# Patient Record
Sex: Female | Born: 1940 | Race: White | Hispanic: No | Smoking: Never smoker
Health system: Southern US, Community
[De-identification: ages and names within clinical notes are randomized; demographics above are authoritative.]

## PROBLEM LIST (undated history)

## (undated) DIAGNOSIS — M545 Low back pain, unspecified: Secondary | ICD-10-CM

## (undated) DIAGNOSIS — G459 Transient cerebral ischemic attack, unspecified: Secondary | ICD-10-CM

## (undated) DIAGNOSIS — E032 Hypothyroidism due to medicaments and other exogenous substances: Secondary | ICD-10-CM

## (undated) DIAGNOSIS — I10 Essential (primary) hypertension: Secondary | ICD-10-CM

## (undated) DIAGNOSIS — M949 Disorder of cartilage, unspecified: Secondary | ICD-10-CM

## (undated) DIAGNOSIS — E785 Hyperlipidemia, unspecified: Secondary | ICD-10-CM

## (undated) DIAGNOSIS — M899 Disorder of bone, unspecified: Secondary | ICD-10-CM

## (undated) DIAGNOSIS — D126 Benign neoplasm of colon, unspecified: Secondary | ICD-10-CM

## (undated) DIAGNOSIS — E7800 Pure hypercholesterolemia, unspecified: Secondary | ICD-10-CM

## (undated) DIAGNOSIS — I340 Nonrheumatic mitral (valve) insufficiency: Secondary | ICD-10-CM

## (undated) DIAGNOSIS — C4490 Unspecified malignant neoplasm of skin, unspecified: Secondary | ICD-10-CM

## (undated) DIAGNOSIS — I350 Nonrheumatic aortic (valve) stenosis: Secondary | ICD-10-CM

## (undated) DIAGNOSIS — C73 Malignant neoplasm of thyroid gland: Secondary | ICD-10-CM

## (undated) DIAGNOSIS — J309 Allergic rhinitis, unspecified: Secondary | ICD-10-CM

## (undated) HISTORY — DX: Nonrheumatic aortic (valve) stenosis: I35.0

## (undated) HISTORY — DX: Hyperlipidemia, unspecified: E78.5

## (undated) HISTORY — DX: Unspecified malignant neoplasm of skin, unspecified: C44.90

## (undated) HISTORY — DX: Nonrheumatic mitral (valve) insufficiency: I34.0

## (undated) HISTORY — PX: COLONOSCOPY: SHX174

## (undated) HISTORY — DX: Pure hypercholesterolemia, unspecified: E78.00

## (undated) HISTORY — DX: Allergic rhinitis, unspecified: J30.9

## (undated) HISTORY — PX: DILATION AND CURETTAGE OF UTERUS: SHX78

## (undated) HISTORY — DX: Malignant neoplasm of thyroid gland: C73

## (undated) HISTORY — DX: Disorder of cartilage, unspecified: M94.9

## (undated) HISTORY — DX: Essential (primary) hypertension: I10

## (undated) HISTORY — DX: Transient cerebral ischemic attack, unspecified: G45.9

## (undated) HISTORY — DX: Low back pain, unspecified: M54.50

## (undated) HISTORY — PX: POLYPECTOMY: SHX149

## (undated) HISTORY — DX: Hypothyroidism due to medicaments and other exogenous substances: E03.2

## (undated) HISTORY — DX: Benign neoplasm of colon, unspecified: D12.6

## (undated) HISTORY — DX: Disorder of bone, unspecified: M89.9

## (undated) HISTORY — DX: Low back pain: M54.5

## (undated) HISTORY — PX: TOTAL THYROIDECTOMY: SHX2547

---

## 1998-06-07 ENCOUNTER — Other Ambulatory Visit: Admission: RE | Admit: 1998-06-07 | Discharge: 1998-06-07 | Payer: Self-pay | Admitting: Obstetrics & Gynecology

## 1999-06-20 ENCOUNTER — Other Ambulatory Visit: Admission: RE | Admit: 1999-06-20 | Discharge: 1999-06-20 | Payer: Self-pay | Admitting: Obstetrics and Gynecology

## 2000-07-09 ENCOUNTER — Other Ambulatory Visit: Admission: RE | Admit: 2000-07-09 | Discharge: 2000-07-09 | Payer: Self-pay | Admitting: Obstetrics and Gynecology

## 2000-10-30 ENCOUNTER — Emergency Department (HOSPITAL_COMMUNITY): Admission: EM | Admit: 2000-10-30 | Discharge: 2000-10-30 | Payer: Self-pay

## 2001-07-18 ENCOUNTER — Other Ambulatory Visit: Admission: RE | Admit: 2001-07-18 | Discharge: 2001-07-18 | Payer: Self-pay | Admitting: Obstetrics and Gynecology

## 2002-03-30 ENCOUNTER — Encounter (INDEPENDENT_AMBULATORY_CARE_PROVIDER_SITE_OTHER): Payer: Self-pay | Admitting: Gastroenterology

## 2002-07-19 ENCOUNTER — Other Ambulatory Visit: Admission: RE | Admit: 2002-07-19 | Discharge: 2002-07-19 | Payer: Self-pay | Admitting: Obstetrics and Gynecology

## 2003-07-23 ENCOUNTER — Other Ambulatory Visit: Admission: RE | Admit: 2003-07-23 | Discharge: 2003-07-23 | Payer: Self-pay | Admitting: Obstetrics and Gynecology

## 2004-09-18 ENCOUNTER — Other Ambulatory Visit: Admission: RE | Admit: 2004-09-18 | Discharge: 2004-09-18 | Payer: Self-pay | Admitting: Obstetrics and Gynecology

## 2004-10-20 ENCOUNTER — Ambulatory Visit: Payer: Self-pay | Admitting: Pulmonary Disease

## 2004-12-05 ENCOUNTER — Ambulatory Visit: Payer: Self-pay | Admitting: Pulmonary Disease

## 2004-12-25 ENCOUNTER — Ambulatory Visit: Payer: Self-pay | Admitting: Pulmonary Disease

## 2005-04-08 ENCOUNTER — Ambulatory Visit: Payer: Self-pay | Admitting: Pulmonary Disease

## 2005-07-09 ENCOUNTER — Ambulatory Visit: Payer: Self-pay | Admitting: Pulmonary Disease

## 2005-08-20 ENCOUNTER — Ambulatory Visit: Payer: Self-pay | Admitting: Pulmonary Disease

## 2005-09-29 ENCOUNTER — Other Ambulatory Visit: Admission: RE | Admit: 2005-09-29 | Discharge: 2005-09-29 | Payer: Self-pay | Admitting: Obstetrics and Gynecology

## 2006-01-06 ENCOUNTER — Ambulatory Visit: Payer: Self-pay | Admitting: Pulmonary Disease

## 2006-07-09 ENCOUNTER — Ambulatory Visit: Payer: Self-pay | Admitting: Pulmonary Disease

## 2006-08-17 ENCOUNTER — Ambulatory Visit: Payer: Self-pay | Admitting: Pulmonary Disease

## 2007-03-01 ENCOUNTER — Ambulatory Visit: Payer: Self-pay | Admitting: Pulmonary Disease

## 2007-03-01 LAB — CONVERTED CEMR LAB
AST: 22 units/L (ref 0–37)
Albumin: 4.4 g/dL (ref 3.5–5.2)
Basophils Absolute: 0 10*3/uL (ref 0.0–0.1)
Bilirubin, Direct: 0.2 mg/dL (ref 0.0–0.3)
Chloride: 105 meq/L (ref 96–112)
Cholesterol: 183 mg/dL (ref 0–200)
Eosinophils Absolute: 0.1 10*3/uL (ref 0.0–0.6)
GFR calc Af Amer: 107 mL/min
GFR calc non Af Amer: 89 mL/min
HCT: 40.4 % (ref 36.0–46.0)
Lymphocytes Relative: 24.2 % (ref 12.0–46.0)
MCHC: 34.9 g/dL (ref 30.0–36.0)
MCV: 88.8 fL (ref 78.0–100.0)
Neutro Abs: 4 10*3/uL (ref 1.4–7.7)
Neutrophils Relative %: 66.4 % (ref 43.0–77.0)
Platelets: 289 10*3/uL (ref 150–400)
Potassium: 4.1 meq/L (ref 3.5–5.1)
RBC: 4.56 M/uL (ref 3.87–5.11)
Sodium: 142 meq/L (ref 135–145)
TSH: 1.26 microintl units/mL (ref 0.35–5.50)
Total CHOL/HDL Ratio: 3.6
Triglycerides: 153 mg/dL — ABNORMAL HIGH (ref 0–149)

## 2007-08-02 ENCOUNTER — Ambulatory Visit: Payer: Self-pay | Admitting: Pulmonary Disease

## 2007-08-17 ENCOUNTER — Ambulatory Visit: Payer: Self-pay | Admitting: Pulmonary Disease

## 2007-08-31 ENCOUNTER — Ambulatory Visit: Payer: Self-pay | Admitting: Pulmonary Disease

## 2007-08-31 LAB — CONVERTED CEMR LAB
Bilirubin, Direct: 0.1 mg/dL (ref 0.0–0.3)
CO2: 30 meq/L (ref 19–32)
Cholesterol: 174 mg/dL (ref 0–200)
Creatinine, Ser: 0.7 mg/dL (ref 0.4–1.2)
GFR calc Af Amer: 107 mL/min
Glucose, Bld: 106 mg/dL — ABNORMAL HIGH (ref 70–99)
HDL: 47.4 mg/dL (ref >39.0)
Potassium: 3.9 meq/L (ref 3.5–5.1)
Sodium: 139 meq/L (ref 135–145)
Total Bilirubin: 0.8 mg/dL (ref 0.3–1.2)
Total CHOL/HDL Ratio: 3.7
Total Protein: 7.7 g/dL (ref 6.0–8.3)
Triglycerides: 136 mg/dL (ref 0–149)

## 2007-12-09 ENCOUNTER — Telehealth (INDEPENDENT_AMBULATORY_CARE_PROVIDER_SITE_OTHER): Payer: Self-pay | Admitting: *Deleted

## 2008-01-24 ENCOUNTER — Encounter: Payer: Self-pay | Admitting: Pulmonary Disease

## 2008-01-24 ENCOUNTER — Telehealth (INDEPENDENT_AMBULATORY_CARE_PROVIDER_SITE_OTHER): Payer: Self-pay | Admitting: *Deleted

## 2008-01-25 ENCOUNTER — Telehealth: Payer: Self-pay | Admitting: Pulmonary Disease

## 2008-01-27 ENCOUNTER — Ambulatory Visit: Payer: Self-pay | Admitting: Pulmonary Disease

## 2008-01-27 ENCOUNTER — Telehealth (INDEPENDENT_AMBULATORY_CARE_PROVIDER_SITE_OTHER): Payer: Self-pay | Admitting: *Deleted

## 2008-01-27 DIAGNOSIS — G459 Transient cerebral ischemic attack, unspecified: Secondary | ICD-10-CM

## 2008-01-27 DIAGNOSIS — M949 Disorder of cartilage, unspecified: Secondary | ICD-10-CM

## 2008-01-27 DIAGNOSIS — I10 Essential (primary) hypertension: Secondary | ICD-10-CM

## 2008-01-27 DIAGNOSIS — E785 Hyperlipidemia, unspecified: Secondary | ICD-10-CM

## 2008-01-27 DIAGNOSIS — M899 Disorder of bone, unspecified: Secondary | ICD-10-CM | POA: Insufficient documentation

## 2008-01-27 LAB — CONVERTED CEMR LAB
AST: 21 units/L (ref 0–37)
Albumin: 4.3 g/dL (ref 3.5–5.2)
Alkaline Phosphatase: 55 units/L (ref 39–117)
BUN: 17 mg/dL (ref 6–23)
Chloride: 102 meq/L (ref 96–112)
GFR calc Af Amer: 128 mL/min
GFR calc non Af Amer: 106 mL/min
Potassium: 4.3 meq/L (ref 3.5–5.1)
Total Bilirubin: 0.6 mg/dL (ref 0.3–1.2)
Total CK: 104 units/L (ref 7–177)

## 2008-01-30 ENCOUNTER — Telehealth (INDEPENDENT_AMBULATORY_CARE_PROVIDER_SITE_OTHER): Payer: Self-pay | Admitting: *Deleted

## 2008-02-09 ENCOUNTER — Ambulatory Visit: Payer: Self-pay

## 2008-02-09 ENCOUNTER — Ambulatory Visit: Payer: Self-pay | Admitting: Pulmonary Disease

## 2008-02-13 ENCOUNTER — Encounter: Payer: Self-pay | Admitting: Pulmonary Disease

## 2008-02-14 ENCOUNTER — Ambulatory Visit (HOSPITAL_COMMUNITY): Admission: RE | Admit: 2008-02-14 | Discharge: 2008-02-14 | Payer: Self-pay | Admitting: Orthopedic Surgery

## 2008-02-16 ENCOUNTER — Telehealth: Payer: Self-pay | Admitting: Pulmonary Disease

## 2008-02-29 ENCOUNTER — Ambulatory Visit: Payer: Self-pay | Admitting: Pulmonary Disease

## 2008-02-29 DIAGNOSIS — M545 Low back pain, unspecified: Secondary | ICD-10-CM | POA: Insufficient documentation

## 2008-02-29 DIAGNOSIS — I679 Cerebrovascular disease, unspecified: Secondary | ICD-10-CM | POA: Insufficient documentation

## 2008-02-29 LAB — CONVERTED CEMR LAB
ALT: 21 units/L (ref 0–35)
AST: 20 units/L (ref 0–37)
Alkaline Phosphatase: 57 units/L (ref 39–117)
Basophils Absolute: 0.1 10*3/uL (ref 0.0–0.1)
Basophils Relative: 0.6 % (ref 0.0–1.0)
Bilirubin, Direct: 0.1 mg/dL (ref 0.0–0.3)
CO2: 28 meq/L (ref 19–32)
Chloride: 102 meq/L (ref 96–112)
Direct LDL: 200.1 mg/dL
Eosinophils Absolute: 0 10*3/uL (ref 0.0–0.7)
GFR calc non Af Amer: 106 mL/min
HDL: 56 mg/dL (ref >39.0)
Lymphocytes Relative: 18 % (ref 12.0–46.0)
MCHC: 33.9 g/dL (ref 30.0–36.0)
MCV: 89.6 fL (ref 78.0–100.0)
Neutrophils Relative %: 73.8 % (ref 43.0–77.0)
Platelets: 344 10*3/uL (ref 150–400)
Potassium: 4.3 meq/L (ref 3.5–5.1)
RBC: 4.86 M/uL (ref 3.87–5.11)
Total Bilirubin: 0.8 mg/dL (ref 0.3–1.2)
Triglycerides: 192 mg/dL — ABNORMAL HIGH (ref 0–149)
VLDL: 38 mg/dL (ref 0–40)
WBC: 9.9 10*3/uL (ref 4.5–10.5)

## 2008-05-09 ENCOUNTER — Telehealth (INDEPENDENT_AMBULATORY_CARE_PROVIDER_SITE_OTHER): Payer: Self-pay | Admitting: *Deleted

## 2008-05-14 ENCOUNTER — Ambulatory Visit: Payer: Self-pay | Admitting: Pulmonary Disease

## 2008-05-21 ENCOUNTER — Telehealth: Payer: Self-pay | Admitting: Adult Health

## 2008-05-21 LAB — CONVERTED CEMR LAB
Cholesterol: 156 mg/dL (ref 0–200)
HDL: 49.5 mg/dL (ref >39.0)
LDL Cholesterol: 86 mg/dL (ref 0–99)
Total CHOL/HDL Ratio: 3.2
Triglycerides: 103 mg/dL (ref 0–149)
VLDL: 21 mg/dL (ref 0–40)

## 2008-05-24 ENCOUNTER — Ambulatory Visit: Payer: Self-pay | Admitting: Internal Medicine

## 2008-05-24 ENCOUNTER — Telehealth: Payer: Self-pay | Admitting: Internal Medicine

## 2008-05-29 LAB — CONVERTED CEMR LAB
Basophils Absolute: 0 10*3/uL (ref 0.0–0.1)
Hemoglobin: 13.8 g/dL (ref 12.0–15.0)
Iron: 56 ug/dL (ref 42–145)
Lymphocytes Relative: 24.7 % (ref 12.0–46.0)
MCHC: 34 g/dL (ref 30.0–36.0)
Neutro Abs: 4.3 10*3/uL (ref 1.4–7.7)
Platelets: 314 10*3/uL (ref 150–400)
RDW: 12.5 % (ref 11.5–14.6)
Transferrin: 287.8 mg/dL (ref >=212.0)

## 2008-05-30 ENCOUNTER — Ambulatory Visit: Payer: Self-pay | Admitting: Gastroenterology

## 2008-06-11 ENCOUNTER — Telehealth: Payer: Self-pay | Admitting: Internal Medicine

## 2008-06-18 ENCOUNTER — Ambulatory Visit: Payer: Self-pay | Admitting: Internal Medicine

## 2008-06-18 ENCOUNTER — Encounter: Payer: Self-pay | Admitting: Internal Medicine

## 2008-06-20 ENCOUNTER — Encounter: Payer: Self-pay | Admitting: Internal Medicine

## 2008-07-25 ENCOUNTER — Ambulatory Visit: Payer: Self-pay | Admitting: Pulmonary Disease

## 2008-08-28 ENCOUNTER — Ambulatory Visit: Payer: Self-pay | Admitting: Pulmonary Disease

## 2008-09-11 ENCOUNTER — Telehealth (INDEPENDENT_AMBULATORY_CARE_PROVIDER_SITE_OTHER): Payer: Self-pay | Admitting: *Deleted

## 2008-09-11 LAB — CONVERTED CEMR LAB
Basophils Absolute: 0 10*3/uL (ref 0.0–0.1)
Chloride: 104 meq/L (ref 96–112)
Eosinophils Absolute: 0.1 10*3/uL (ref 0.0–0.7)
Eosinophils Relative: 2 % (ref 0.0–5.0)
GFR calc non Af Amer: 106 mL/min
Iron: 91 ug/dL (ref 42–145)
Lymphocytes Relative: 27 % (ref 12.0–46.0)
MCHC: 34.7 g/dL (ref 30.0–36.0)
MCV: 89.7 fL (ref 78.0–100.0)
Neutrophils Relative %: 62.4 % (ref 43.0–77.0)
Platelets: 304 10*3/uL (ref 150–400)
Potassium: 4.2 meq/L (ref 3.5–5.1)
Saturation Ratios: 21.5 % (ref 20.0–50.0)
WBC: 6.5 10*3/uL (ref 4.5–10.5)

## 2008-09-12 ENCOUNTER — Ambulatory Visit: Payer: Self-pay | Admitting: Pulmonary Disease

## 2008-09-16 DIAGNOSIS — D126 Benign neoplasm of colon, unspecified: Secondary | ICD-10-CM

## 2008-09-16 LAB — CONVERTED CEMR LAB: Cholesterol: 189 mg/dL (ref 0–200)

## 2008-10-08 ENCOUNTER — Ambulatory Visit: Payer: Self-pay | Admitting: Pulmonary Disease

## 2009-01-18 ENCOUNTER — Telehealth: Payer: Self-pay | Admitting: Pulmonary Disease

## 2009-02-27 ENCOUNTER — Ambulatory Visit: Payer: Self-pay | Admitting: Pulmonary Disease

## 2009-02-27 DIAGNOSIS — J309 Allergic rhinitis, unspecified: Secondary | ICD-10-CM | POA: Insufficient documentation

## 2009-03-03 LAB — CONVERTED CEMR LAB
Alkaline Phosphatase: 69 units/L (ref 39–117)
Basophils Relative: 0.2 % (ref 0.0–3.0)
Bilirubin, Direct: 0.1 mg/dL (ref 0.0–0.3)
Calcium: 9.9 mg/dL (ref 8.4–10.5)
Creatinine, Ser: 0.6 mg/dL (ref 0.4–1.2)
Eosinophils Absolute: 0.1 10*3/uL (ref 0.0–0.7)
HDL: 54.7 mg/dL (ref >39.00)
LDL Cholesterol: 116 mg/dL — ABNORMAL HIGH (ref 0–99)
Lymphocytes Relative: 25.1 % (ref 12.0–46.0)
MCHC: 33.5 g/dL (ref 30.0–36.0)
Neutrophils Relative %: 65.8 % (ref 43.0–77.0)
RBC: 4.64 M/uL (ref 3.87–5.11)
Total CHOL/HDL Ratio: 4
Total Protein: 8 g/dL (ref 6.0–8.3)
Triglycerides: 114 mg/dL (ref 0.0–149.0)
VLDL: 22.8 mg/dL (ref 0.0–40.0)
WBC: 5.5 10*3/uL (ref 4.5–10.5)

## 2009-03-04 ENCOUNTER — Ambulatory Visit (HOSPITAL_COMMUNITY): Admission: RE | Admit: 2009-03-04 | Discharge: 2009-03-04 | Payer: Self-pay | Admitting: Pulmonary Disease

## 2009-03-06 ENCOUNTER — Ambulatory Visit: Payer: Self-pay

## 2009-03-07 ENCOUNTER — Telehealth: Payer: Self-pay | Admitting: Pulmonary Disease

## 2009-03-07 ENCOUNTER — Encounter: Payer: Self-pay | Admitting: Pulmonary Disease

## 2009-03-13 ENCOUNTER — Encounter: Payer: Self-pay | Admitting: Pulmonary Disease

## 2009-03-13 ENCOUNTER — Encounter: Admission: RE | Admit: 2009-03-13 | Discharge: 2009-03-13 | Payer: Self-pay | Admitting: Pulmonary Disease

## 2009-03-13 ENCOUNTER — Other Ambulatory Visit: Admission: RE | Admit: 2009-03-13 | Discharge: 2009-03-13 | Payer: Self-pay | Admitting: Interventional Radiology

## 2009-03-13 ENCOUNTER — Encounter (INDEPENDENT_AMBULATORY_CARE_PROVIDER_SITE_OTHER): Payer: Self-pay | Admitting: Interventional Radiology

## 2009-03-21 ENCOUNTER — Telehealth (INDEPENDENT_AMBULATORY_CARE_PROVIDER_SITE_OTHER): Payer: Self-pay | Admitting: *Deleted

## 2009-04-04 ENCOUNTER — Encounter: Payer: Self-pay | Admitting: Pulmonary Disease

## 2009-05-27 ENCOUNTER — Encounter (INDEPENDENT_AMBULATORY_CARE_PROVIDER_SITE_OTHER): Payer: Self-pay | Admitting: Surgery

## 2009-05-27 ENCOUNTER — Ambulatory Visit (HOSPITAL_COMMUNITY): Admission: RE | Admit: 2009-05-27 | Discharge: 2009-05-28 | Payer: Self-pay | Admitting: Surgery

## 2009-05-27 ENCOUNTER — Encounter: Payer: Self-pay | Admitting: Pulmonary Disease

## 2009-07-03 ENCOUNTER — Encounter: Payer: Self-pay | Admitting: Pulmonary Disease

## 2009-07-14 ENCOUNTER — Emergency Department (HOSPITAL_BASED_OUTPATIENT_CLINIC_OR_DEPARTMENT_OTHER): Admission: EM | Admit: 2009-07-14 | Discharge: 2009-07-14 | Payer: Self-pay | Admitting: Emergency Medicine

## 2009-07-15 ENCOUNTER — Encounter: Payer: Self-pay | Admitting: Pulmonary Disease

## 2009-07-22 ENCOUNTER — Encounter (HOSPITAL_COMMUNITY): Admission: RE | Admit: 2009-07-22 | Discharge: 2009-10-03 | Payer: Self-pay | Admitting: Internal Medicine

## 2009-08-09 ENCOUNTER — Ambulatory Visit: Payer: Self-pay | Admitting: Pulmonary Disease

## 2009-08-14 ENCOUNTER — Encounter: Payer: Self-pay | Admitting: Pulmonary Disease

## 2009-08-29 ENCOUNTER — Ambulatory Visit: Payer: Self-pay | Admitting: Pulmonary Disease

## 2009-08-29 DIAGNOSIS — C73 Malignant neoplasm of thyroid gland: Secondary | ICD-10-CM

## 2009-08-29 DIAGNOSIS — E032 Hypothyroidism due to medicaments and other exogenous substances: Secondary | ICD-10-CM

## 2009-09-16 ENCOUNTER — Encounter: Payer: Self-pay | Admitting: Pulmonary Disease

## 2009-09-20 ENCOUNTER — Telehealth (INDEPENDENT_AMBULATORY_CARE_PROVIDER_SITE_OTHER): Payer: Self-pay | Admitting: *Deleted

## 2009-10-13 ENCOUNTER — Ambulatory Visit: Payer: Self-pay | Admitting: Diagnostic Radiology

## 2009-10-13 ENCOUNTER — Encounter: Payer: Self-pay | Admitting: Adult Health

## 2009-10-13 ENCOUNTER — Emergency Department (HOSPITAL_BASED_OUTPATIENT_CLINIC_OR_DEPARTMENT_OTHER): Admission: EM | Admit: 2009-10-13 | Discharge: 2009-10-13 | Payer: Self-pay | Admitting: Emergency Medicine

## 2009-10-14 ENCOUNTER — Ambulatory Visit: Payer: Self-pay | Admitting: Internal Medicine

## 2009-10-22 ENCOUNTER — Telehealth: Payer: Self-pay | Admitting: Adult Health

## 2009-11-11 ENCOUNTER — Ambulatory Visit: Payer: Self-pay | Admitting: Pulmonary Disease

## 2009-12-11 ENCOUNTER — Ambulatory Visit: Payer: Self-pay | Admitting: Pulmonary Disease

## 2010-01-01 ENCOUNTER — Encounter: Admission: RE | Admit: 2010-01-01 | Discharge: 2010-01-01 | Payer: Self-pay | Admitting: Internal Medicine

## 2010-02-27 ENCOUNTER — Ambulatory Visit: Payer: Self-pay | Admitting: Pulmonary Disease

## 2010-03-01 LAB — CONVERTED CEMR LAB
ALT: 21 units/L (ref 0–35)
AST: 24 units/L (ref 0–37)
Albumin: 4.5 g/dL (ref 3.5–5.2)
Alkaline Phosphatase: 58 units/L (ref 39–117)
Basophils Absolute: 0 10*3/uL (ref 0.0–0.1)
CO2: 30 meq/L (ref 19–32)
Calcium: 9.5 mg/dL (ref 8.4–10.5)
Chloride: 104 meq/L (ref 96–112)
Cholesterol: 173 mg/dL (ref 0–200)
Eosinophils Absolute: 0.1 10*3/uL (ref 0.0–0.7)
Glucose, Bld: 93 mg/dL (ref 70–99)
HDL: 51.8 mg/dL (ref >39.00)
Lymphocytes Relative: 21.8 % (ref 12.0–46.0)
Monocytes Relative: 8.9 % (ref 3.0–12.0)
Neutrophils Relative %: 65.9 % (ref 43.0–77.0)
Platelets: 253 10*3/uL (ref 150.0–400.0)
Potassium: 4.2 meq/L (ref 3.5–5.1)
RDW: 13.5 % (ref 11.5–14.6)
Sodium: 142 meq/L (ref 135–145)
Total Protein: 8.1 g/dL (ref 6.0–8.3)
VLDL: 23.4 mg/dL (ref 0.0–40.0)

## 2010-03-13 ENCOUNTER — Encounter (INDEPENDENT_AMBULATORY_CARE_PROVIDER_SITE_OTHER): Payer: Self-pay | Admitting: *Deleted

## 2010-03-14 ENCOUNTER — Ambulatory Visit: Payer: Self-pay

## 2010-03-14 ENCOUNTER — Encounter: Payer: Self-pay | Admitting: Pulmonary Disease

## 2010-04-03 IMAGING — CR DG CHEST 2V
2 series · 2 of 2 positions shown · non-contrast
Comparison: 05/23/2009

CLINICAL DATA: Hypertension.

CHEST - 2 VIEW

[w chest pa]
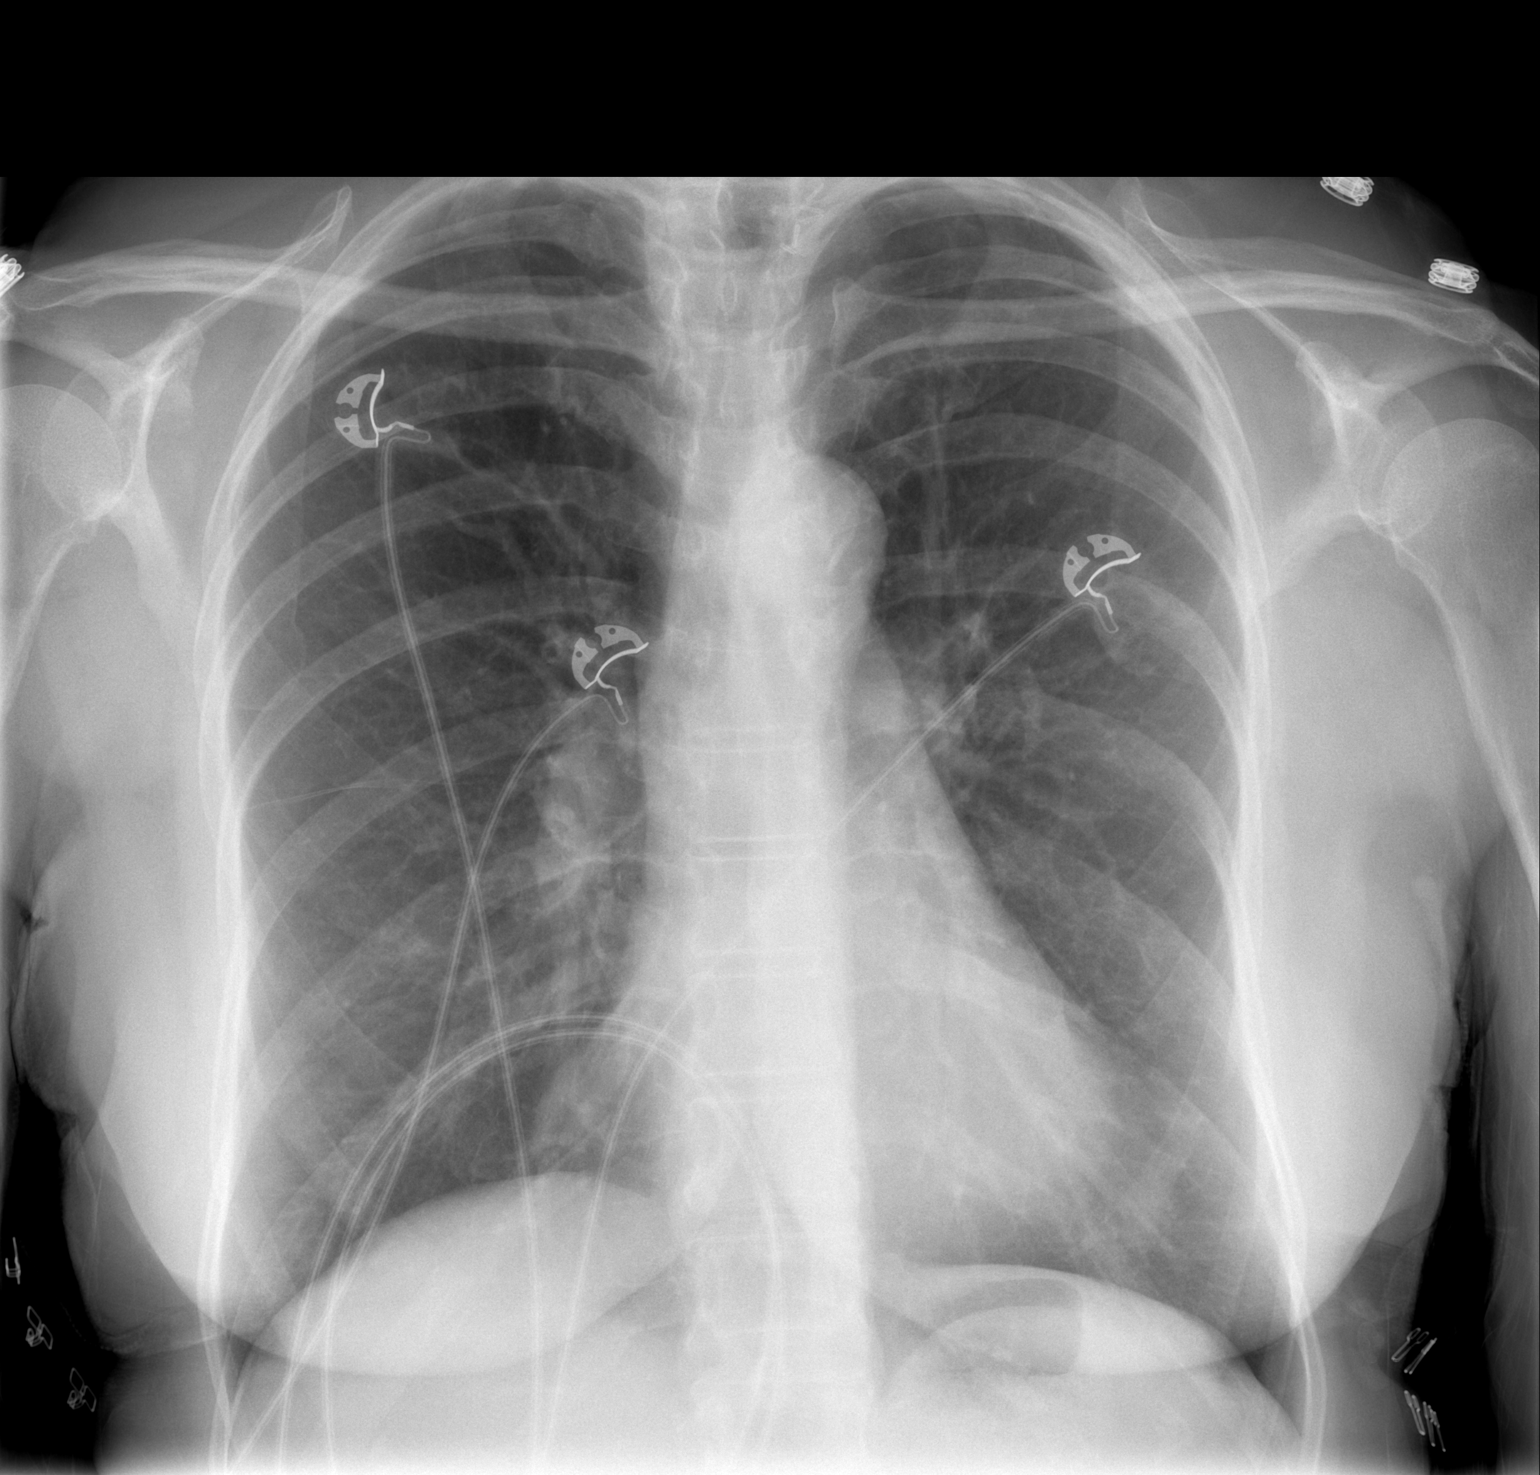

[w chest lat]
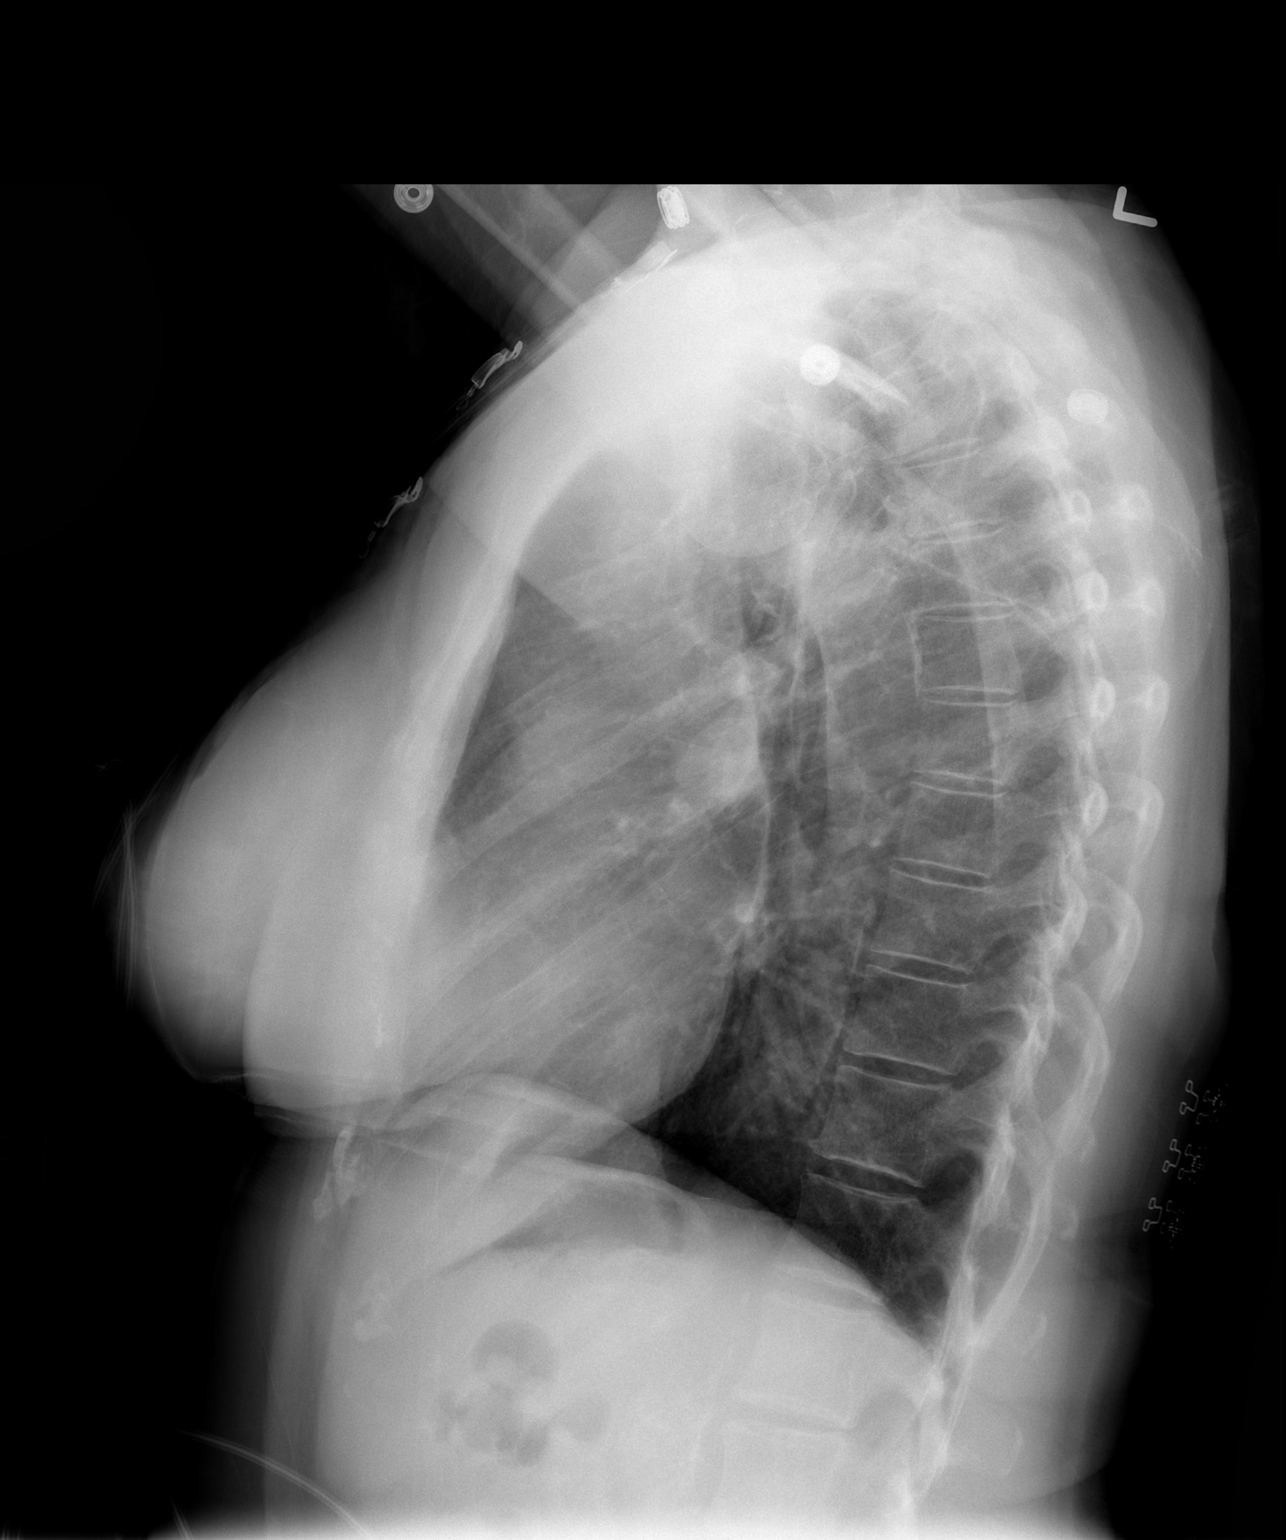

[2 of 2 positions shown; findings below may reference images not displayed]

FINDINGS: Cardiomegaly is identified.
The lungs are clear.
There is no evidence of focal airspace disease, pulmonary edema,
pleural effusion, or pneumothorax.
No acute bony abnormalities are identified.
IMPRESSION: Cardiomegaly without evidence of acute cardiopulmonary disease.

## 2010-04-03 IMAGING — CT CT HEAD W/O CM
1 series · 16 of 30 positions shown, 20 images · non-contrast
Comparison: None

CLINICAL DATA: Hypertension and headache

CT HEAD WITHOUT CONTRAST
TECHNIQUE: Contiguous axial images were obtained from the base of
the skull through the vertex without contrast.

[Series 2: head 4.8 h37s · axial · 0.44mm/px · z∈[-137,-4]mm · 16 of 32 slices shown, 20 images]
[im 2/32  brain]
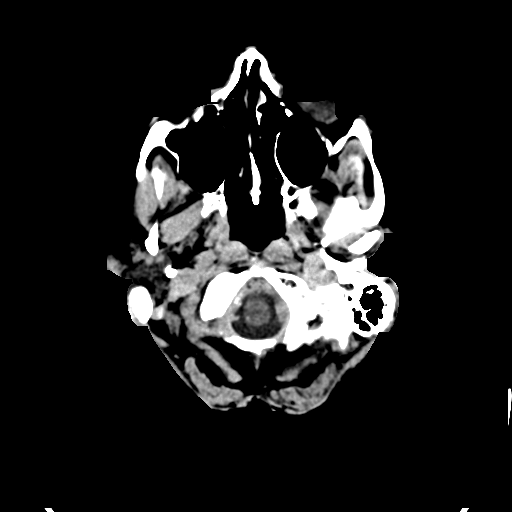
[im 2/32  bone]
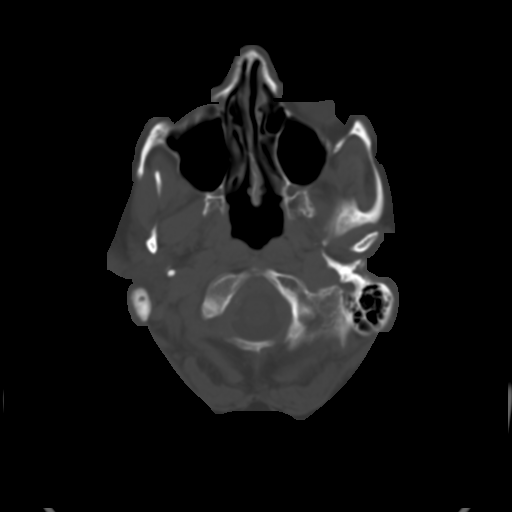
[im 4/32  brain]
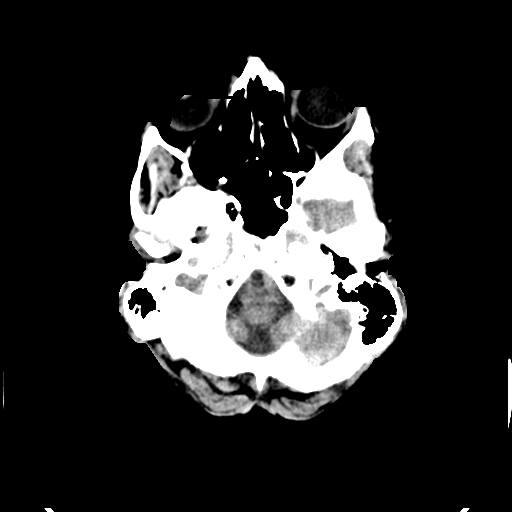
[im 6/32  brain]
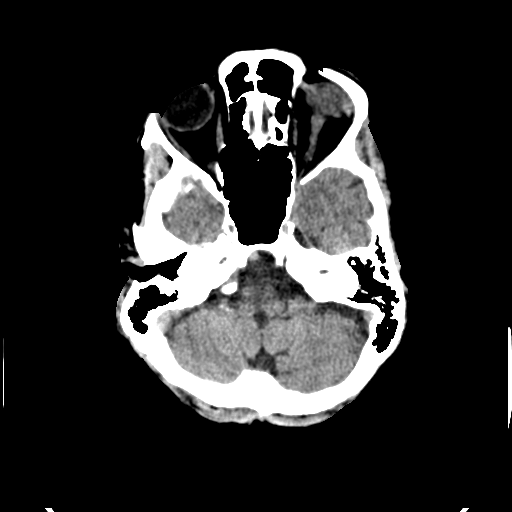
[im 8/32  brain]
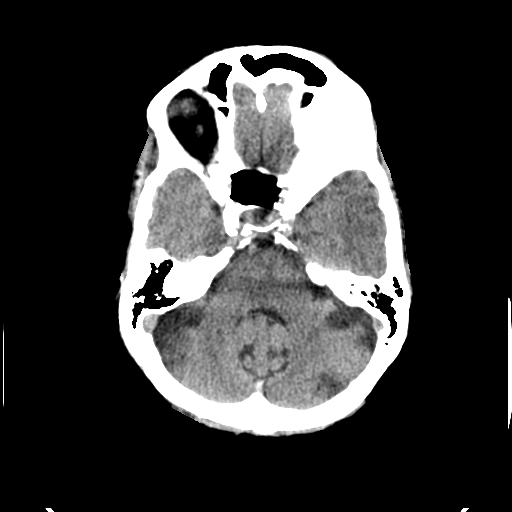
[im 9/32  brain]
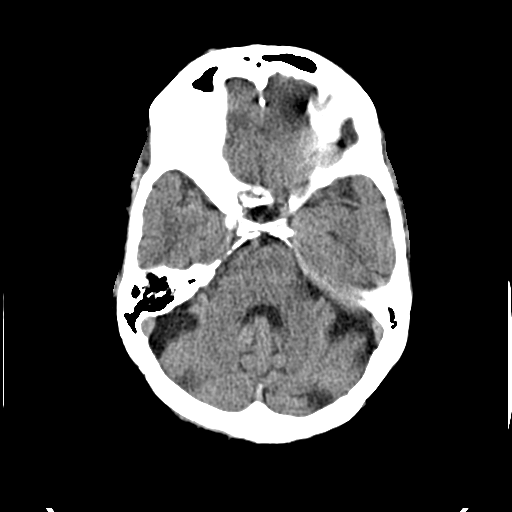
[im 9/32  bone]
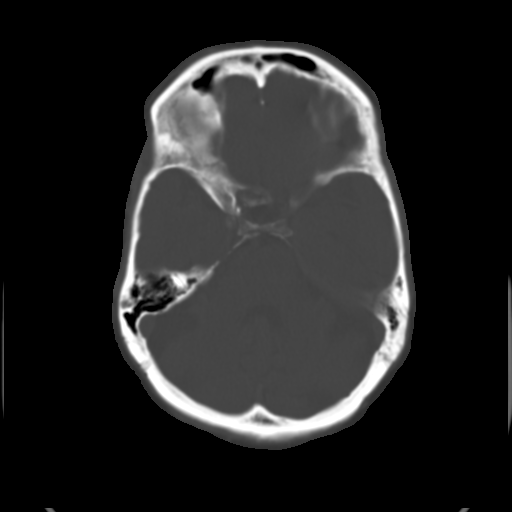
[im 11/32  brain]
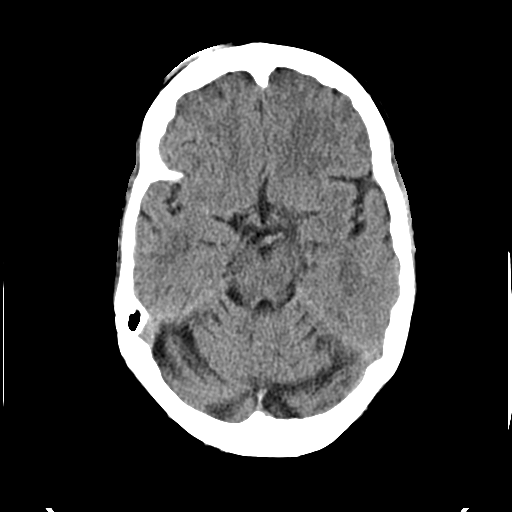
[im 13/32  brain]
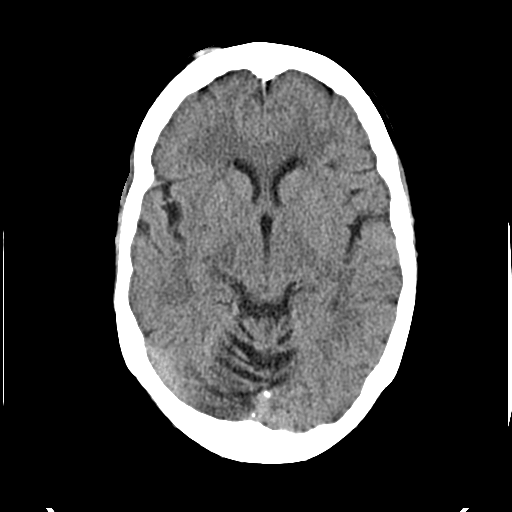
[im 15/32  brain]
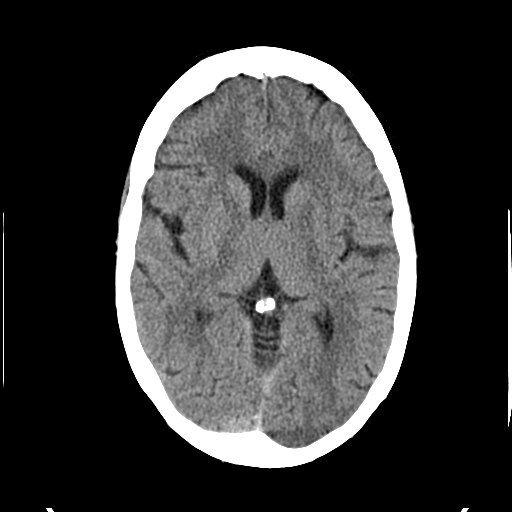
[im 17/32  brain]
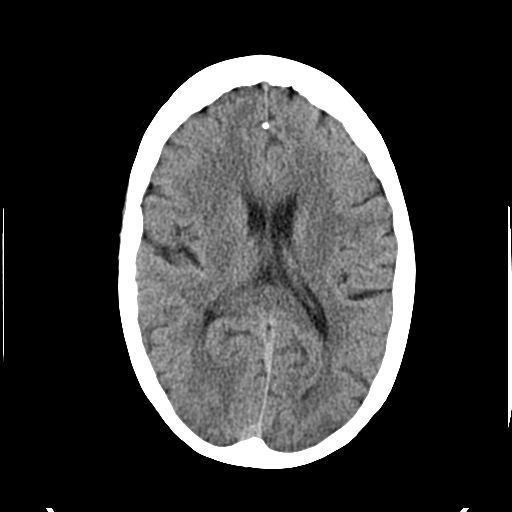
[im 17/32  bone]
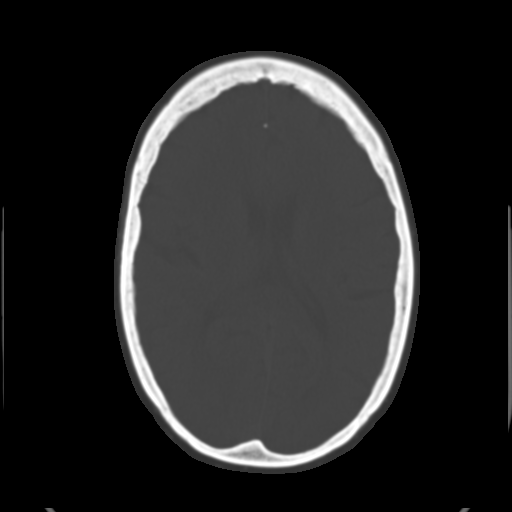
[im 19/32  brain]
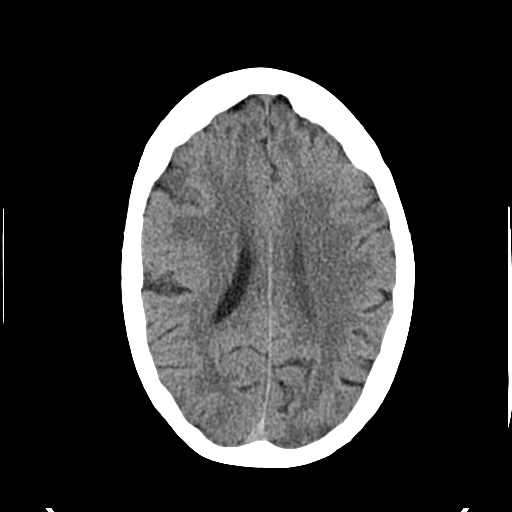
[im 21/32  brain]
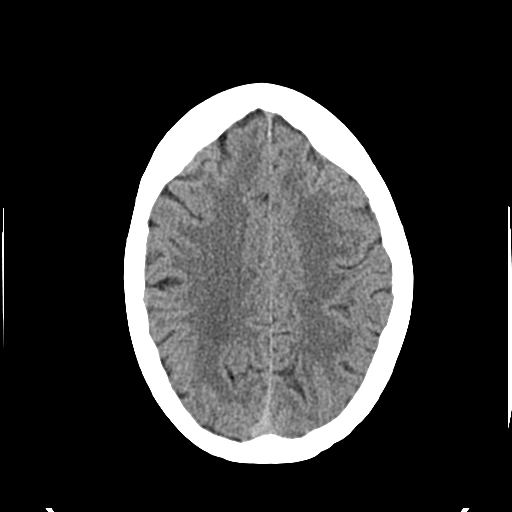
[im 23/32  brain]
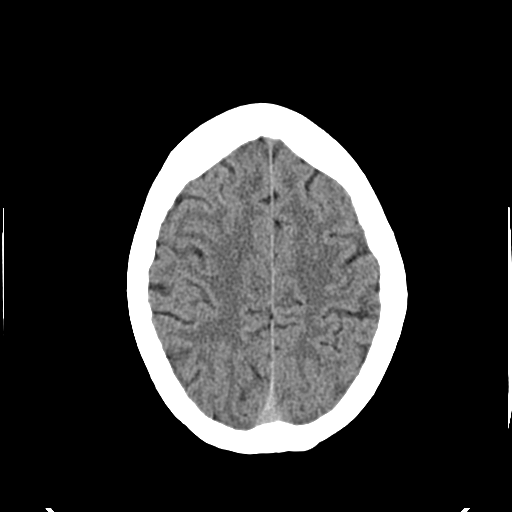
[im 24/32  brain]
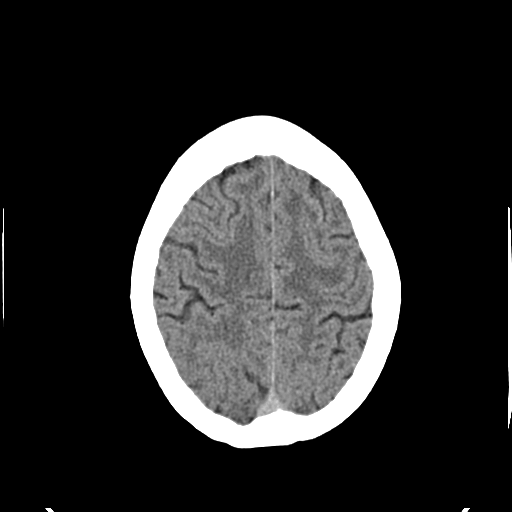
[im 24/32  bone]
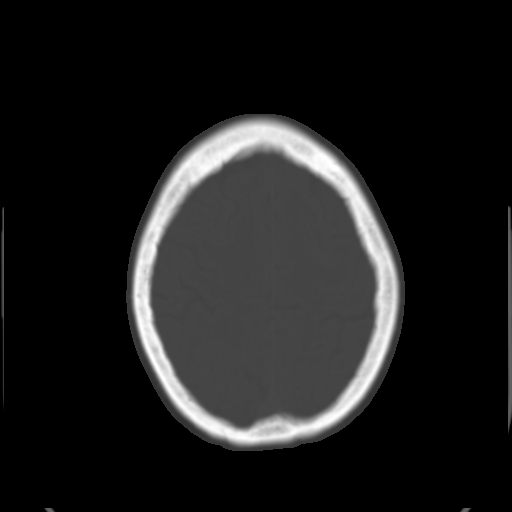
[im 26/32  brain]
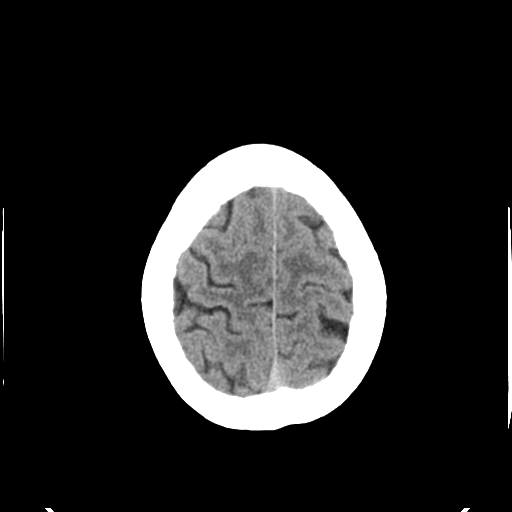
[im 28/32  brain]
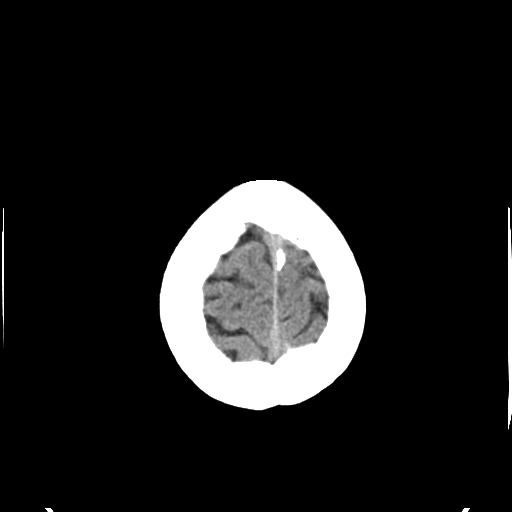
[im 30/32  brain]
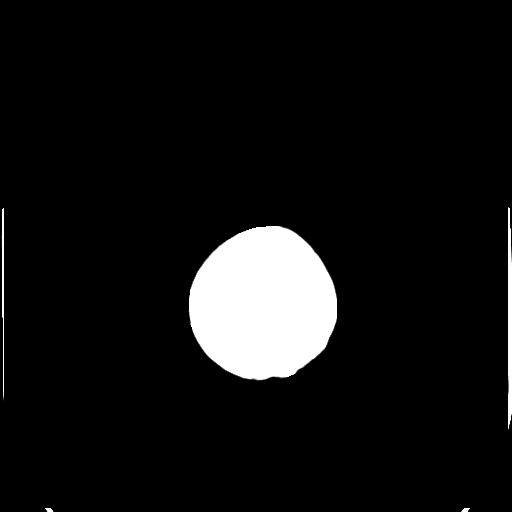

[16 of 30 positions shown; findings below may reference images not displayed]

FINDINGS: Cerebellar volume loss is identified.

No acute intracranial abnormalities are identified, including mass
lesion or mass effect, hydrocephalus, extra-axial fluid collection,
midline shift, hemorrhage, or acute infarction.  Please note that
acute infarction may be occult on CT for 24-48 hours.

The visualized bony calvarium is unremarkable.
IMPRESSION: No evidence of acute intracranial abnormality.

Cerebellar atrophy.

## 2010-07-23 ENCOUNTER — Encounter: Payer: Self-pay | Admitting: Pulmonary Disease

## 2010-07-25 ENCOUNTER — Ambulatory Visit: Payer: Self-pay | Admitting: Pulmonary Disease

## 2010-08-19 ENCOUNTER — Telehealth (INDEPENDENT_AMBULATORY_CARE_PROVIDER_SITE_OTHER): Payer: Self-pay | Admitting: *Deleted

## 2010-08-25 ENCOUNTER — Encounter (HOSPITAL_COMMUNITY)
Admission: RE | Admit: 2010-08-25 | Discharge: 2010-11-01 | Payer: Self-pay | Source: Home / Self Care | Attending: Internal Medicine | Admitting: Internal Medicine

## 2010-08-28 ENCOUNTER — Ambulatory Visit: Payer: Self-pay | Admitting: Pulmonary Disease

## 2010-08-30 LAB — CONVERTED CEMR LAB
CO2: 30 meq/L (ref 19–32)
Calcium: 9.1 mg/dL (ref 8.4–10.5)
Chloride: 97 meq/L (ref 96–112)
Creatinine, Ser: 0.6 mg/dL (ref 0.4–1.2)
Glucose, Bld: 94 mg/dL (ref 70–99)
HDL: 45.4 mg/dL (ref >39.00)
LDL Cholesterol: 89 mg/dL (ref 0–99)
Total CHOL/HDL Ratio: 3

## 2010-10-14 ENCOUNTER — Encounter: Payer: Self-pay | Admitting: Pulmonary Disease

## 2010-11-17 ENCOUNTER — Encounter: Payer: Self-pay | Admitting: Pulmonary Disease

## 2010-12-02 NOTE — Letter (Signed)
Summary: [facility] @ [place]   Imported ByLennie Odor 07/30/2010 14:31:33  _____________________________________________________________________  External Attachment:    Type:   Image     Comment:   External Document

## 2010-12-02 NOTE — Assessment & Plan Note (Signed)
Summary: 6 months/apc   Primary Care Provider:  Lorin Picket Kadon Andrus,MD  CC:  3 month ROV & review of medical issues....  History of Present Illness: 70 y/o WF here for a follow up visit... she has mult med problems as listed...    ~  February 27, 2009:  she notes the spring pollen has been rough but notes that a ZPak helped... she has been doing well on her exercise program- 30 min daily on the treadmill...      ** NOTE: exam today shows a right lower pole thyroid nodule- for eval---   ~  August 29, 2009:  when last seen right lower pole thyrroid nodule was being evaluated>>> bx showed a papillary thyroid carcinoma & subseq surg by DrCornett, CCS w/ total thyroidectomy... he sent pt to Endocrine, DrKerr, post op w/ RAI given 9/10 (106 mCi I- 131) and I- 131 whole body scan only showed uptake in neck... now on replacement SYNTHROID 112 micrograms/d & she continues f/u w/ DrKerr.   ~  December 11, 2009:  she notes that incr dose of Synthroid to 112 by DrKerr caused her BP to rise >140 sys (they decr back to 100)... TP increased Norvasc to 10mg /d and added Metoprolol- titrated up to 50mg /d... BP now much improved w/ most readings  ~130's sys... feeling better as well.   ~  February 27, 2010:  BP remains well controlled on Metoprolol, Norvasc, Lasix... no cerebrovasc symptoms on Aggrenox & due for f/u CDopplers in /May... Chol also improved w/ her Simva20... she continues f/u w/ DrKerr for her Thyroid Ca & she is sched for another course of RAI "just to be sure"...    Current Problem List:  ALLERGIC RHINITIS (ICD-477.9) - we discussed Rx w/ Zyrtek, Saline, Mucinex OTC...  HYPERTENSION (ICD-401.9) - on METOPROLOL XL 50mg /d, NORVASC 10mg /d, & LASIX 20mg - 1/2 tab daily...  BP=136/72, doing well w/ med + home BP checks... denies HA, fatigue, visual changes, CP, palipit, dizziness, syncope, dyspnea, edema, etc...  MITRAL VALVE PROLAPSE (ICD-424.0) - clinical diagnosis in the past... no recent symptoms of CP or  palpit... can't find prev 2DEcho...  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 CDoppler's w/ non-obstructive plaque right carotid bulb, no signif ICA stenoses...  ~  f/u CDoppler 5/10 showed mild plaque bilat, 0-39% blat ICA stenoses... f/u 10yr.  ~  f/u CDoppler pending 5/11  HYPERLIPIDEMIA (ICD-272.4) - she was prev on Pravachol, and changed to SIMVASTATIN 20mg /d in 2008... she tried off meds 3/09 due to leg pains- but further eval showed HNP...   ~  pre-treatment FLP 3/04 showed TChol 282, TG 210, HDL 56, LDL 184... statin Rx started...  ~  f/u FLP's on Statin Rx were good:  TChol 150-166, TG 87-119, HDL 35-49, LDL 86-107...  ~  FLP 10/08 on Zocor20/d showed TChol 174, TG 136, HDL 47, LDL 99...  ~  FLP 4/09 off Zocor showed TChol 285, TG 192, HDL 56, LDL 200... rec- restart Zocor20.  ~  FLP 11/09 on Simva20 showed TChol 189, Tg 142, HDL 46, LDL 115... rec> continue same.  ~  FLP 4/10 on Simva20 showed TChol 193, TG 114, HDL 55, LDL 116... may need incr dose.  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 eval revealed a papillary  carcinoma on the right, and Hashimoto's disease on the left- s/p total thyroidectomy 7/10 by DrCornett... Endocrine eval by DrKerr9/10 w/ radioactive iodine therapy- 106 mCi I- 131 given and scan showed only uptake in neck.  ~  labs 4/10 showed TSH = 1.30  ~  9/10:  started on thyroid replacement by DrKerr- currently SYNTHROID 112 micrograms/d.  ~  12/10: Synthroid decreased to 144mcg/d due to ?elevated BP?... labs followed by DrKerr.  ~  4/11: pt reports that DrKerr plans another course of RAI "just to be sure"...  COLONIC POLYPS (ICD-211.3) - last colonoscopy 8/09 by DrPerry w/ several adenomatous polyps removed... f/u  planned 5 yrs.  BACK PAIN, LUMBAR (ICD-724.2) - eval DrDuda 4/09 w/ HNP L5-S1 and treated w/ epid steroid shot by DrNewton and improved...  OSTEOPENIA (ICD-733.90) - prev on Fosamax but this was stopped by GYN after last BMD (2008) at [facility] showed improved BMD... she takes Caltrate, Vits, Vit D...  ~  labs 4/09 showed Vit D level = 33 therefore started Vit D OTC 11-1998 daly.  ~  labs 4/10 showed Vit D level = 59 on 1000 u daily.  ~  pt reports that f/u BMD 9/10 was "excellent"..  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 stable on AGGRENOX Bid...  Skin Cancer - BCE removed from left ankle by drNolan 8/10...  Health Maintenance:    ~  last colonoscopy 8/09 by DrPerry> see above.  ~  GYN= DrRichardson... pt states she is seen every other yr & Mammogram/ BMD at [facility].  ~  Immunizations:  Tetanus 1999;  Pneumovax in 2006 at age 2;  she gets the Flu shots yearly...   Allergies: 1)  ! Sulfa  Comments:  Nurse/Medical Assistant: The patient's medications and allergies were reviewed with the patient and were updated in the Medication and Allergy Lists.  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 Father died age 46 w/ stroke & HBP Mother died age 60 w/ CHF, hx DM 7 Siblings:  brothers died w/ prostate cancer and lung cancer others alive w/ hx of DM, HBP, Chol...  Social  History: Reviewed history from 02/27/2009 and no changes required. Married, husb= Douglas, 50yrs. No children never smoked no alcohol retired- data entry for [employer]  Review of Systems      See HPI  The patient denies anorexia, fever, weight loss, weight gain, vision loss, decreased hearing, hoarseness, chest pain, syncope, dyspnea on exertion, peripheral edema, prolonged cough, headaches, hemoptysis, abdominal pain, melena, hematochezia, severe indigestion/heartburn, hematuria, incontinence, muscle weakness, suspicious skin lesions, transient blindness, difficulty walking, depression, unusual weight change, abnormal bleeding, enlarged lymph nodes, and angioedema.    Vital Signs:  Patient profile:   70 year old female Height:      61 inches Weight:      128 pounds BMI:     24.27 O2 Sat:      99 % on Room air Temp:     97.9 degrees F oral Pulse rate:   74 / minute BP sitting:   136 / 72  (  right arm) Cuff size:   regular  Vitals Entered By: Randell Loop CMA (February 27, 2010 8:55 AM)  O2 Sat at Rest %:  99 O2 Flow:  Room air CC: 3 month ROV & review of medical issues... Is Patient Diabetic? No Pain Assessment Patient in pain? no      Comments no changes in meds today   Physical Exam  Additional Exam:  WD, WN, 70 y/o WF in NAD... GENERAL:  Alert & oriented; pleasant & cooperative... HEENT:  [place]/AT, EOM-wnl, PERRLA, EACs-clear, TMs-wnl, NOSE-clear, THROAT-clear & wnl. NECK:  Supple w/ fairROM; no JVD; normal carotid impulses w/o bruits; scar of prev thyroid surg, no adenopathy. CHEST:  Clear to P & A; without wheezes/ rales/ or rhonchi. HEART:  Regular Rhythm; without murmurs/ rubs/ or gallops. ABDOMEN:  Soft & nontender; normal bowel sounds; no organomegaly or masses detected. EXT: without deformities or arthritic changes; no varicose veins/ venous insuffic/ or edema. NEURO:  CN's intact; motor testing normal; sensory testing normal; gait normal & balance OK. DERM:  No  lesions noted; no rash etc...    MISC. Report  Procedure date:  02/27/2010  Findings:      BMP (METABOL)   Sodium                    142 mEq/L                   135-145   Potassium                 4.2 mEq/L                   3.5-5.1   Chloride                  104 mEq/L                   96-112   Carbon Dioxide            30 mEq/L                    19-32   Glucose                   93 mg/dL                    04-54   BUN                       11 mg/dL                    0-98   Creatinine                0.6 mg/dL                   1.1-9.1   Calcium                   9.5 mg/dL                   4.7-82.9   GFR                       105.02 mL/min               >60  Hepatic/Liver Function Panel (HEPATIC)   Total Bilirubin           0.4 mg/dL  0.3-1.2   Direct Bilirubin          0.2 mg/dL                   5.3-6.6   Alkaline Phosphatase      58 U/L                      39-117   AST                       24 U/L                      0-37   ALT                       21 U/L                      0-35   Total Protein             8.1 g/dL                    4.4-0.3   Albumin                   4.5 g/dL                    4.7-4.2  CBC Platelet w/Diff (CBCD)   White Cell Count          5.2 K/uL                    4.5-10.5   Red Cell Count            4.40 Mil/uL                 3.87-5.11   Hemoglobin                13.3 g/dL                   59.5-63.8   Hematocrit                39.3 %                      36.0-46.0   MCV                       89.4 fl                     78.0-100.0   Platelet Count            253.0 K/uL                  150.0-400.0   Neutrophil %              65.9 %                      43.0-77.0   Lymphocyte %              21.8 %                      12.0-46.0   Monocyte %                8.9 %  3.0-12.0   Eosinophils%              2.7 %                       0.0-5.0   Basophils %               0.7 %                        0.0-3.0  Comments:      Lipid Panel (LIPID)   Cholesterol               173 mg/dL                   1-610   Triglycerides             117.0 mg/dL                 9.6-045.4   HDL                       09.81 mg/dL                 >19.14   LDL Cholesterol           98 mg/dL                    7-82  Vitamin D (25-Hydroxy) (95621)  Vitamin D (25-Hydroxy)                             39 ng/mL                    30-89   Impression & Recommendations:  Problem # 1:  HYPERTENSION (ICD-401.9) Controlled on meds-  continue the same. Her updated medication list for this problem includes:    Metoprolol Succinate 50 Mg Xr24h-tab (Metoprolol succinate) .[name]... 1 by mouth once daily    Norvasc 10 Mg Tabs (Amlodipine besylate) .[name]... 1 by mouth once daily    Furosemide 20 Mg Tabs (Furosemide) .[name]... Take 1/2 tab by mouth once daily as needed for swelling...  Orders: TLB-BMP (Basic Metabolic Panel-BMET) (80048-METABOL) TLB-Hepatic/Liver Function Pnl (80076-HEPATIC) TLB-CBC Platelet - w/Differential (85025-CBCD) TLB-Lipid Panel (80061-LIPID) T-Vitamin D (25-Hydroxy) (30865-78469)  Problem # 2:  CEREBROVASCULAR DISEASE (ICD-437.9) Stable on Aggrenox...  Problem # 3:  HYPERLIPIDEMIA (ICD-272.4) FLP is improved on the Simva20... Her updated medication list for this problem includes:    Simvastatin 20 Mg Tabs (Simvastatin) .[name]... Take 1 tab by mouth at bedtime...  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 she tells me that he plans more RAI Rx...  Problem # 5:  COLONIC POLYPS (ICD-211.3) Colonoscopy 8/09 w/ polyps removed... f/u 71yrs.  Problem # 6:  OSTEOPENIA (ICD-733.90) She reports that BMD is improved-  she is off the Fosamax.  Problem # 7:  OTHER MEDICAL PROBLEMS AS NOTED>>>  Complete Medication List: 1)  Mucinex Dm 30-600 Mg Tb12 (Dextromethorphan-guaifenesin) .[name].. 1-2 tabs by mouth every 12 hours as needed 2)  Aggrenox 25-200 Mg Cp12  (Aspirin-dipyridamole) .... Take 1 tab by mouth two times a day... 3)  Metoprolol Succinate 50 Mg Xr24h-tab (Metoprolol succinate) .[name].. 1 by mouth once daily 4)  Norvasc 10 Mg Tabs (Amlodipine besylate) .[name].. 1 by mouth once daily 5)  Furosemide 20 Mg Tabs (Furosemide) .Marland KitchenMarland KitchenMarland Kitchen  Take 1/2 tab by mouth once daily as needed for swelling... 6)  Simvastatin 20 Mg Tabs (Simvastatin) .... Take 1 tab by mouth at bedtime.Marland KitchenMarland Kitchen 7)  Levothyroxine Sodium 100 Mcg Tabs (Levothyroxine sodium) .... Take 1 tablet by mouth once a day 8)  Calcium Carbonate-vitamin D 600-400 Mg-unit Tabs (Calcium carbonate-vitamin d) .... 2 tabs by mouth once daily 9)  Centrum Silver Tabs (Multiple vitamins-minerals) .... Take 1 tablet by mouth once a day 10)  Vitamin D 1000 Unit Tabs (Cholecalciferol) .... One tablet by mouth once daily 11)  [facility] Vitamin E 400 Unit Caps (Vitamin e) .... Take 1 tablet by mouth once a day  Other Orders: Prescription Created Electronically [phone])  Patient Instructions: 1)  Today we updated your med list- see below.... 2)  We reilled the meds you requested today... 3)  We also did your follow up FASTING blood work...  please call the "phone tree" in a few days for your lab results.Marland KitchenMarland Kitchen 4)  Call for any problems.Marland KitchenMarland Kitchen 5)  Please schedule a follow-up appointment in 6 months. Prescriptions: AGGRENOX 25-200 MG  CP12 (ASPIRIN-DIPYRIDAMOLE) take 1 tab by mouth two times a day...  #60 x prn   Entered and Authorized by:   Michele Mcalpine MD   Signed by:   Michele Mcalpine MD on 02/27/2010   Method used:   Print then Give to Patient   RxID:   [id number] SIMVASTATIN 20 MG  TABS (SIMVASTATIN) take 1 tab by mouth at bedtime...  #30 x prn   Entered and Authorized by:   Michele Mcalpine MD   Signed by:   Michele Mcalpine MD on 02/27/2010   Method used:   Print then Give to Patient   RxID:   [id number]

## 2010-12-02 NOTE — Progress Notes (Signed)
Summary: will iodine swallow effect labs?  Phone Note Call from Patient Call back at Home Phone [phone]   Caller: Patient Call For: NADEL Summary of Call: pt will be having tests done a day or two before having labs here (appt w/ nadel 10/27). this will consist of radioactive iodine swallow. does pt need to rsc w/ nadel? her spouse has an appt the same day. call home #( or cell later today [phone]) Initial call taken by: Tivis Ringer, CNA,  August 19, 2010 9:14 AM  Follow-up for Phone Call        Pt is scheduld to have another body scan because of her history of thyroid cancer and she is scheduled to swallow iodine for this scan on Wed 08-27-10. Pt wants to know will the iodine effect any of her blood work. Pt has appt with SN on 08-28-10. Please advise.Carron Curie CMA  August 19, 2010 11:17 AM   Additional Follow-up for Phone Call Additional follow up Details #1::        per SN---no it will not affect her labs and she is not dangerous to be around...keep her appt.  thanks Randell Loop CMA  August 19, 2010 2:07 PM   [id number] on home #, ATC cell number but was disconnected. Carron Curie CMA  August 19, 2010 2:14 PM     Additional Follow-up for Phone Call Additional follow up Details #2::    called and spoke with pt.  pt aware of SN's response.  Marland KitchenArman Filter LPN  August 19, 2010 2:34 PM

## 2010-12-02 NOTE — Miscellaneous (Signed)
Summary: Orders Update  Clinical Lists Changes  Orders: Added new Test order of Carotid Duplex (Carotid Duplex) - Signed 

## 2010-12-02 NOTE — Assessment & Plan Note (Signed)
Summary: rov 6 months///kp   Primary Care Provider:  Lorin Picket Nadel,MD  CC:  6 month ROV & review of mult medical problems....  History of Present Illness: 70 y/o WF here for a follow up visit... she has mult med problems as listed...    ~  August 29, 2009:  when last seen right lower pole thyrroid nodule was being evaluated>>> bx showed a papillary thyroid carcinoma & subseq surg by DrCornett, CCS w/ total thyroidectomy... he sent pt to Endocrine, DrKerr, post op w/ RAI given 9/10 (106 mCi I- 131) and I- 131 whole body scan only showed uptake in neck... now on replacement SYNTHROID 112 micrograms/d & she continues f/u w/ DrKerr.   ~  December 11, 2009:  she notes that incr dose of Synthroid to 112 by DrKerr caused her BP to rise >140 sys (they decr back to 100)... TP increased Norvasc to 10mg /d and added Metoprolol- titrated up to 50mg /d... BP now much improved w/ most readings  ~130's sys... feeling better as well.   ~  February 27, 2010:  BP remains well controlled on Metoprolol, Norvasc, Lasix... no cerebrovasc symptoms on Aggrenox & due for f/u CDopplers in May (stable mild carotid dis)... Chol also improved w/ her Simva20... she continues f/u w/ DrKerr for her Thyroid Ca...   ~  August 28, 2010:  she is sched for an I-131 total body scan tomorrow per DrKerr (Neg- no evid met dis)... recent thyroid blood tests were OK- thyroglob undetectable & TSH= 0.42 w/ FreeT4= 1.28 (0.61-1.12)... BP remains controlled on her 3 med regimen;  no CP, palpit, SOB, or cerebral ischemic symtpoms on her Aggrenox;  Lipids remain controlled w/ diet + Simva20;  otherw stable- no new complaints or concerns, refills written today, had Flu shot 9/11.    Current Problem List:  ALLERGIC RHINITIS (ICD-477.9) - we discussed Rx w/ Zyrtek, Saline, Mucinex OTC...  HYPERTENSION (ICD-401.9) - on METOPROLOL XL 50mg /d, NORVASC 10mg /d, & LASIX 20mg - 1/2 tab daily...  BP=134/86, doing well w/ med + home BP checks... denies HA,  fatigue, visual changes, CP, palipit, dizziness, syncope, dyspnea, edema, etc...  MITRAL VALVE PROLAPSE (ICD-424.0) - clinical diagnosis in the past... no recent symptoms of CP or palpit... can't find prev 2DEcho...  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 CDoppler's w/ non-obstructive plaque right carotid bulb, no signif ICA stenoses...  ~  f/u CDoppler 5/10 showed mild plaque bilat, 0-39% blat ICA stenoses... f/u 19yr.  ~  f/u CDoppler 5/11 showed mild plaque bilat, 0-39% bilat ICA stenoses, no change, f/u 58yr.  HYPERLIPIDEMIA (ICD-272.4) - she was prev on Pravachol, and changed to SIMVASTATIN 20mg /d in 2008... she tried off meds 3/09 due to leg pains- but further eval showed HNP...   ~  pre-treatment FLP 3/04 showed TChol 282, TG 210, HDL 56, LDL 184... statin Rx started...  ~  f/u FLP's on Statin Rx were good:  TChol 150-166, TG 87-119, HDL 35-49, LDL 86-107...  ~  FLP 10/08 on Zocor20/d showed TChol 174, TG 136, HDL 47, LDL 99...  ~  FLP 4/09 off Zocor showed TChol 285, TG 192, HDL 56, LDL 200... rec- restart Zocor20.  ~  FLP 11/09 on Simva20 showed TChol 189, Tg 142, HDL 46, LDL 115... rec> continue same.  ~  FLP 4/10 on Simva20 showed TChol 193, TG 114, HDL 55, LDL 116... may need incr dose.  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 Endocrine eval by DrKerr9/10 w/ radioactive iodine therapy- 106 mCi I- 131 given and scan showed only uptake in neck.  ~  labs 4/10 showed TSH = 1.30  ~  9/10:  started on thyroid replacement by DrKerr- currently  Synthroid 112 micrograms/d.  ~  12/10: Synthroid decreased to 162mcg/d due to ?elevated BP?... labs followed by DrKerr.  ~  9/11:  f/u DrKerr doing well- labs on SYNTHROID 132mcg/d= OK, and I-131 whole body scan reported neg.  COLONIC POLYPS (ICD-211.3) - last colonoscopy 8/09 by DrPerry w/ several adenomatous polyps removed... f/u planned 5 yrs.  BACK PAIN, LUMBAR (ICD-724.2) - eval DrDuda 4/09 w/ HNP L5-S1 and treated w/ epid steroid shot by DrNewton and improved...  OSTEOPENIA (ICD-733.90) - prev on Fosamax but this was stopped by GYN after last BMD (2008) at [facility] showed improved BMD... she takes Caltrate, Vits, Vit D...  ~  labs 4/09 showed Vit D level = 33 therefore started Vit D OTC 11-1998 daly.  ~  labs 4/10 showed Vit D level = 59 on 1000 u daily.  ~  pt reports that f/u BMD 9/10 was "excellent"...  ~  labs 4/11 showed Vit d level = 39... rec> continue 1000 u daily.  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 stable on AGGRENOX Bid...  Skin Cancer - BCE removed from left ankle by DrNolan 8/10...  Health Maintenance:    ~  last colonoscopy 8/09 by DrPerry> see above.  ~  GYN= DrRichardson... pt states she is seen every other yr & Mammogram/ BMD at [facility].  ~  Immunizations:  Tetanus/ TDAP updated 2010;  Pneumovax in 2006 at age 39;  she gets the Flu shots yearly...   Preventive Screening-Counseling & Management  Alcohol-Tobacco     Smoking Status: never  Allergies: 1)  ! Sulfa  Comments:  Nurse/Medical Assistant: The patient's medications and allergies were reviewed with the patient and were updated in the Medication and Allergy Lists.  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 Father died age 71 w/ stroke & HBP Mother died age 35 w/ CHF, hx DM 7 Siblings:  brothers died w/ prostate cancer and lung cancer others alive w/ hx of DM, HBP, Chol...  Social History: Reviewed history from 02/27/2009 and no changes required. Married, husb= [place], 35yrs. No children never smoked no alcohol retired- data entry for [employer]  Review of Systems      See HPI  The patient denies anorexia, fever, weight loss, weight gain, vision loss, decreased hearing, hoarseness, chest pain, syncope, dyspnea on exertion, peripheral edema, prolonged cough, headaches, hemoptysis, abdominal pain, melena, hematochezia, severe indigestion/heartburn, hematuria, incontinence, muscle weakness, suspicious skin lesions, transient blindness,  difficulty walking, depression, unusual weight change, abnormal bleeding, enlarged lymph nodes, and angioedema.    Vital Signs:  Patient profile:   70 year old female Height:      61 inches Weight:      128.13 pounds BMI:     24.30 O2 Sat:      99 % on Room air Temp:     97.2 degrees F oral Pulse rate:   66 / minute BP sitting:   134 / 86  (right arm) Cuff size:   regular  Vitals Entered By: Randell Loop CMA (August 28, 2010 8:59 AM)  O2 Sat at Rest %:  99 O2 Flow:  Room air CC: 6 month ROV & review of mult medical problems... Is Patient Diabetic? No Pain Assessment Patient in pain? no      Comments meds updated today with pt   Physical Exam  Additional Exam:  WD, WN, 70 y/o WF in NAD... GENERAL:  Alert & oriented; pleasant & cooperative... HEENT:  [place]/AT, EOM-wnl, PERRLA, EACs-clear, TMs-wnl, NOSE-clear, THROAT-clear &  wnl. NECK:  Supple w/ fairROM; no JVD; normal carotid impulses w/o bruits; scar of prev thyroid surg, no adenopathy. CHEST:  Clear to P & A; without wheezes/ rales/ or rhonchi. HEART:  Regular Rhythm; without murmurs/ rubs/ or gallops. ABDOMEN:  Soft & nontender; normal bowel sounds; no organomegaly or masses detected. EXT: without deformities or arthritic changes; no varicose veins/ venous insuffic/ or edema. NEURO:  CN's intact; motor testing normal; sensory testing normal; gait normal & balance OK. DERM:  No lesions noted; no rash etc...    MISC. Report  Procedure date:  08/28/2010  Findings:      BMP (METABOL)   Sodium               [L]  132 mEq/L                   135-145   Potassium                 4.2 mEq/L                   3.5-5.1   Chloride                  97 mEq/L                    96-112   Carbon Dioxide            30 mEq/L                    19-32   Glucose                   94 mg/dL                    60-45   BUN                       14 mg/dL                    4-09   Creatinine                0.6 mg/dL                   8.1-1.9   Calcium  9.1 mg/dL                   0.4-54.0   GFR                       104.87 mL/min               >60  Tests: (2) Lipid Panel (LIPID)   Cholesterol               154 mg/dL                   9-811   Triglycerides             97.0 mg/dL                  9.1-478.2   HDL                       95.62 mg/dL                 >13.08  LDL Cholesterol           89 mg/dL                    6-57   Impression & Recommendations:  Problem # 1:  HYPERTENSION (ICD-401.9) Controlled on meds>  conue sae Her updated medication list for this problem includes:    Metoprolol Succinate 50 Mg Xr24h-tab (Metoprolol succinate) .[name]... 1 by mouth once daily    Norvasc 10 Mg Tabs (Amlodipine besylate) .[name]... 1 by mouth once daily    Furosemide 20 Mg Tabs (Furosemide) .[name]... Take 1/2 tab by mouth once daily as needed for  swelling...  Orders: TLB-BMP (Basic Metabolic Panel-BMET) (80048-METABOL) TLB-Lipid Panel (80061-LIPID)  Problem # 2:  CEREBROVASCULAR DISEASE (ICD-437.9) Stable on Aggrenox w/o cerebral ischemic symptoms...  Problem # 3:  HYPERLIPIDEMIA (ICD-272.4) Stable on diet + Simva20. Her updated medication list for this problem includes:    Simvastatin 20 Mg Tabs (Simvastatin) .[name]... Take 1 tab by mouth at bedtime...  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 continue Synthroid 145mcg/d replacement Rx.  Problem # 5:  COLONIC POLYPS (ICD-211.3) GI stable & up to date...  Problem # 6:  OSTEOPENIA (ICD-733.90) Followed by GYN w/MD at [facility] pt reports "excellent"...  Problem # 7:  OTHER MEDICAL ISSUES AS NOTED>>>  Complete Medication List: 1)  Mucinex Dm 30-600 Mg Tb12 (Dextromethorphan-guaifenesin) .[name].. 1-2 tabs by mouth every 12 hours as needed 2)  Aggrenox 25-200 Mg Cp12 (Aspirin-dipyridamole) .... Take 1 tab by mouth two times a day... 3)  Metoprolol Succinate 50 Mg Xr24h-tab (Metoprolol succinate) .[name].. 1 by mouth once daily 4)  Norvasc 10 Mg Tabs (Amlodipine besylate) .[name].. 1 by mouth once daily 5)  Furosemide 20 Mg Tabs (Furosemide) .... Take 1/2 tab by mouth once daily as needed for swelling... 6)  Simvastatin 20 Mg Tabs (Simvastatin) .... Take 1 tab by mouth at bedtime.Marland KitchenMarland Kitchen 7)  Levothyroxine Sodium 100 Mcg Tabs (Levothyroxine sodium) .... Take 1 tablet by mouth once a day 8)  Calcium Carbonate-vitamin D 600-400 Mg-unit Tabs (Calcium carbonate-vitamin d) .... 2 tabs by mouth once daily 9)  Centrum Silver Tabs (Multiple vitamins-minerals) .... Take 1 tablet by mouth once a day 10)  Vitamin D 2000 Unit Tabs (Cholecalciferol) .... Take 1 tablet by mouth once a day 11)  Shingles Vaccine  .... Administer shingles vaccine please...  Patient Instructions: 1)  Today we updated your med list- see below.... 2)  We  refilled the meds you requested... 3)  Today we diod your follow up Lipid Profile... please call the "phone tree" in a few days for your lab results.Marland KitchenMarland Kitchen 4)  Keep up the good work w/ diet + exercise... 5)  Call for any problems.Marland KitchenMarland Kitchen 6)  Please schedule a follow-up appointment in 6 months. Prescriptions: SHINGLES VACCINE Administer Shingles vaccine please...  #1 x 0   Entered and Authorized by:   Michele Mcalpine MD   Signed by:   Michele Mcalpine MD on 08/28/2010   Method used:   Print then Give to Patient   RxID:   [id number] FUROSEMIDE 20 MG  TABS (FUROSEMIDE) take 1/2 tab by mouth once daily as needed for swelling...  #30 x 12   Entered and Authorized by:   Michele Mcalpine MD   Signed by:   Michele Mcalpine MD on 08/28/2010   Method used:   Print then Give to Patient   RxID:   [id number] NORVASC 10 MG TABS (AMLODIPINE BESYLATE) 1 by mouth once daily  #30 x 12   Entered and Authorized by:   Michele Mcalpine MD   Signed by:   Michele Mcalpine MD on 08/28/2010   Method used:   Print then Give to Patient   RxID:   [id number] METOPROLOL SUCCINATE 50 MG XR24H-TAB (METOPROLOL SUCCINATE) 1 by mouth once daily  #30 x 12   Entered and Authorized by:   Michele Mcalpine MD   Signed by:   Michele Mcalpine MD on 08/28/2010   Method used:   Print then Give to Patient   RxID:   [id number]

## 2010-12-02 NOTE — Assessment & Plan Note (Signed)
Summary: 4 weeks/apc   Primary Care Provider:  Lorin Picket Ceylon Arenson,MD  CC:  4 week follow up on BP.  History of Present Illness: 70 y/o WF here for a follow up visit... she has mult med problems as listed...    ~  February 27, 2009:  she notes the spring pollen has been rough but notes that a ZPak helped... she has been doing well on her exercise program- 30 min daily on the treadmill...      ** NOTE: exam today shows a right lower pole thyroid nodule- for eval---   ~  August 29, 2009:  when last seen right lower pole thyrroid nodule was being evaluated>>> bx showed a papillary thyroid carcinoma & subseq surg by DrCornett, CCS w/ total thyroidectomy... he sent pt to Endocrine, DrKerr, post op w/ RAI given 9/10 (106 mCi I- 131) and I- 131 whole body scan only showed uptake in neck... now on replacement SYNTHROID 112 micrograms/d & she continues f/u w/ DrKerr.   ~  December 11, 2009:  she notes that incr dose of Synthroid to 112 by DrKerr caused her BP to rise >140 sys... TP increased Norvasc to 10mg /d and added Metoprolol- titrated up to 50mg /d... BP now much improved w/ most readings  ~130's sys... feeling better as well.    Current Problem List:  ALLERGIC RHINITIS (ICD-477.9) - we discussed Rx w/ Zyrtek, Saline, Mucinex OTC...  HYPERTENSION (ICD-401.9) - on METOPROLOL XL 50mg /d, NORVASC 10mg /d, & LASIX 20mg - 1/2 tab daily...  BP=138/66, doing well w/ med + home BP checks... denies HA, fatigue, visual changes, CP, palipit, dizziness, syncope, dyspnea, edema, etc...  MITRAL VALVE PROLAPSE (ICD-424.0) - clinical diagnosis in the past... no recent symptoms of CP or palpit... can't find prev 2DEcho...  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 CDoppler's w/ non-obstructive plaque right carotid bulb, no signif ICA stenoses...  ~  f/u CDoppler 5/10 showed mild plaque bilat, 0-39% blat ICA stenoses... f/u  61yr.  HYPERLIPIDEMIA (ICD-272.4) - she was prev on Pravachol, and changed to SIMVASTATIN 20mg /d in 2008... she tried off meds 3/09 due to leg pains- but further eval showed HNP...   ~  pre-treatment FLP 3/04 showed TChol 282, TG 210, HDL 56, LDL 184... statin Rx started...  ~  f/u FLP's on Statin Rx were good:  TChol 150-166, TG 87-119, HDL 35-49, LDL 86-107...  ~  FLP 10/08 on Zocor20/d showed TChol 174, TG 136, HDL 47, LDL 99...  ~  FLP 4/09 off Zocor showed TChol 285, TG 192, HDL 56, LDL 200... rec- restart Zocor20.  ~  FLP 11/09 on Simva20 showed TChol 189, Tg 142, HDL 46, LDL 115... rec> continue same.  ~  FLP 4/10 on Simva20 showed TChol 193, TG 114, HDL 55, LDL 116... may need incr dose.  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 Endocrine eval by DrKerr9/10 w/ radioactive iodine therapy- 106 mCi I- 131 given and scan showed only uptake in neck.  ~  labs 4/10 showed TSH = 1.30  ~  9/10:  started on thyroid replacement by DrKerr- currently SYNTHROID 112 micrograms/d.  ~  12/10: Synthroid decreased to 111mcg/d due to ?elevated BP?... labs followed by DrKerr.  COLONIC POLYPS (ICD-211.3) - last colonoscopy 8/09 by DrPerry  w/ several adenomatous polyps removed... f/u planned 5 yrs.  BACK PAIN, LUMBAR (ICD-724.2) - eval DrDuda 4/09 w/ HNP L5-S1 and treated w/ epid steroid shot by DrNewton and improved...  OSTEOPENIA (ICD-733.90) - prev on Fosamax but this was stopped by GYN after last BMD (2008) at [facility] showed improved BMD... she takes Caltrate, Vits, Vit D...  ~  labs 4/09 showed Vit D level = 33 therefore started Vit D OTC 11-1998 daly.  ~  labs 4/10 showed Vit D level = 59 on 1000 u daily.  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 stable on AGGRENOX Bid...  Skin Cancer - BCE removed from left ankle by drNolan 8/10...  Health Maintenance:    ~  last colonoscopy 5/03 was normal...  ~  GYN= DrRichardson... last seen 2008 and due this yr.  ~  Immunizations:  Tetanus 1999;  Pneumovax 2006;  she had both Flu shots in 2009;  she will get the Shingles vaccine at health dept.    Allergies: 1)  ! Sulfa  Comments:  Nurse/Medical Assistant: The patient's medications and allergies were reviewed with the patient and were updated in the Medication and Allergy Lists.  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 Father died age 75 w/ stroke & HBP Mother died age 41 w/ CHF, hx DM 7 Siblings:  brothers died w/ prostate cancer and lung cancer others alive w/ hx of DM, HBP, Chol...  Social History: Reviewed history from 02/27/2009 and no changes required. Married, husb= [place], 77yrs. No children never smoked no alcohol retired- data entry for [employer]  Review of Systems      See HPI  The patient denies anorexia, fever, weight loss, weight gain, vision loss, decreased hearing, hoarseness, chest pain, syncope, dyspnea on exertion, peripheral edema, prolonged cough, headaches, hemoptysis, abdominal pain, melena, hematochezia, severe indigestion/heartburn, hematuria, incontinence, muscle weakness, suspicious skin lesions, transient blindness,  difficulty walking, depression, unusual weight change, abnormal bleeding, enlarged lymph nodes, and angioedema.    Vital Signs:  Patient profile:   70 year old female Height:      61 inches Weight:      129.38 pounds O2 Sat:      98 % on Room air Temp:     96.9 degrees F oral Pulse rate:   90 / minute BP sitting:   138 / 66  (left arm) Cuff size:   regular  Vitals Entered By: Randell Loop CMA (December 11, 2009 3:03 PM)  O2 Sat at Rest %:  98 O2 Flow:  Room air CC: 4 week follow up on BP Is Patient Diabetic? No Pain Assessment Patient in pain? no      Comments no changes in meds today   Physical Exam  Additional Exam:  WD, WN, 70 y/o WF in NAD... GENERAL:  Alert & oriented; pleasant & cooperative... HEENT:  [place]/AT, EOM-wnl, PERRLA, EACs-clear, TMs-wnl, NOSE-clear, THROAT-clear & wnl. NECK:  Supple w/ fairROM; no JVD; normal carotid impulses w/o bruits; scar of prev thyroid surg, no adenopathy. CHEST:  Clear to  P & A; without wheezes/ rales/ or rhonchi. HEART:  Regular Rhythm; without murmurs/ rubs/ or gallops. ABDOMEN:  Soft & nontender; normal bowel sounds; no organomegaly or masses detected. EXT: without deformities or arthritic changes; no varicose veins/ venous insuffic/ or edema. NEURO:  CN's intact; motor testing normal; sensory testing normal; gait normal & balance OK. DERM:  No lesions noted; no rash etc...     Impression & Recommendations:  Problem # 1:  HYPERTENSION (ICD-401.9) Better control w/ Metop50 & Norvasc10, along w/ Lasix 10mg ... Her updated medication list for this problem includes:    Metoprolol Succinate 50 Mg Xr24h-tab (Metoprolol succinate) .[name]... 1 by mouth once daily    Norvasc 10 Mg Tabs (Amlodipine besylate) .[name]... 1 by mouth once daily    Furosemide 20 Mg Tabs (Furosemide) .[name]... Take 1/2 tab by mouth once daily as needed for swelling...  Problem # 2:  MITRAL VALVE PROLAPSE (ICD-424.0) Denies CP, palpit, etc... Her updated medication list  for this problem includes:    Aggrenox 25-200 Mg Cp12 (Aspirin-dipyridamole) .[name]... Take 1 tab by mouth two times a day...    Metoprolol Succinate 50 Mg Xr24h-tab (Metoprolol succinate) .[name]... 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 Her updated medication list for this problem includes:    Levothyroxine Sodium 100 Mcg Tabs (Levothyroxine sodium) .[name]... Take 1 tablet by mouth once a day  Problem # 5:  OTHER MEDICAL PROBLEMS AS NOTED>>>  Complete Medication List: 1)  Mucinex Dm 30-600 Mg Tb12 (Dextromethorphan-guaifenesin) .[name].. 1-2 tabs by mouth every 12 hours as needed 2)  Aggrenox 25-200 Mg Cp12 (Aspirin-dipyridamole) .... Take 1 tab by mouth two times a day... 3)  Metoprolol Succinate 50 Mg Xr24h-tab (Metoprolol succinate) .[name].. 1 by mouth once daily 4)  Norvasc 10 Mg Tabs (Amlodipine besylate) .[name].. 1 by mouth once daily 5)  Furosemide 20 Mg Tabs (Furosemide) .... Take 1/2 tab by mouth once daily as needed for swelling... 6)  Simvastatin 20 Mg Tabs (Simvastatin) .... Take 1 tab by mouth at bedtime.Marland KitchenMarland Kitchen 7)  Levothyroxine Sodium 100 Mcg Tabs (Levothyroxine sodium) .... Take 1 tablet by mouth once a day 8)  Calcium Carbonate-vitamin D 600-400 Mg-unit Tabs (Calcium carbonate-vitamin d) .... 2 tabs by mouth once daily 9)  Centrum Silver Tabs (Multiple vitamins-minerals) .... Take 1 tablet by mouth once a day 10)  Vitamin D 1000 Unit Tabs (Cholecalciferol) .... One tablet by mouth once daily 11)  [facility] Vitamin E 400 Unit Caps (Vitamin e) .... Take 1 tablet by mouth once a day  Other Orders: Prescription Created Electronically [phone])  Patient Instructions: 1)  Today we updated your med list- see below.... 2)  Continue the Metoprolol 50mg /d and your other meds the same... 3)  Call for any problems.Marland KitchenMarland Kitchen 4)  Keep your  prev scheduled f/u appt... Prescriptions: METOPROLOL SUCCINATE 50 MG XR24H-TAB (METOPROLOL SUCCINATE) 1 by mouth once daily  #30 x prn   Entered and Authorized by:   Michele Mcalpine MD   Signed by:   Michele Mcalpine MD on 12/11/2009   Method used:   Print then Give to Patient   RxID:   [id number]

## 2010-12-02 NOTE — Assessment & Plan Note (Signed)
Summary: flu shot ///kp   Nurse Visit Flu Vaccine Consent Questions     Do you have a history of severe allergic reactions to this vaccine? no    Any prior history of allergic reactions to egg and/or gelatin? no    Do you have a sensitivity to the preservative Thimersol? no    Do you have a past history of Guillan-Barre Syndrome? no    Do you currently have an acute febrile illness? no    Have you ever had a severe reaction to latex? no    Vaccine information given and explained to patient? yes    Are you currently pregnant? no    Lot Number:[id number]   Exp Date:05/02/2011   Site Given  Left Deltoid IM  .[facility]  July 25, 2010 3:32 PM    Allergies: 1)  ! Sulfa  Orders Added: 1)  Flu Vaccine 75yrs + MEDICARE PATIENTS [Q2039] 2)  Administration Flu vaccine - MCR [G0008]

## 2010-12-02 NOTE — Assessment & Plan Note (Signed)
Summary: NP follow up - HTN   Primary Provider/Referring Provider:  Lorin Picket Nadel,MD  CC:  4 week HTN follow up - states still running a little high.  Dr. Sharl Ma decreased levothyroxine to to help. [name]  History of Present Illness: 70 year old female with known history of PVD with previous TIA on aggrenox, Hyperlipidemia, HTN .    ~  February 27, 2009:  she notes the spring pollen has been rough but notes that a ZPak helped... she has been doing well on her exercise program- 30 min daily on the treadmill...      ** NOTE: exam today shows a right lower pole thyroid nodule- for eval---   ~  August 29, 2009:  when last seen right lower pole thyrroid nodule was being evaluated>>> bx showed a papillary thyroid carcinoma & subseq surg by DrCornett, CCS w/ total thyroidectomy... he sent pt to Endocrine, DrKerr, post op w/ RAI given 9/10 (106 mCi I- 131) and I- 131 whole body scan only showed uptake in neck... now on replacement SYNTHROID 112 micrograms/d & she continues f/u w/ DrKerr.  October 14, 2009--Presents for ER follow up. Was seen in [facility] last night for CP, chest tightness, HA, HTN.  [name] Records revealed neg cardiac enzymes,nml TSH,  EKG w/ no acute changes, neg CXR and CT head. Recommended to follow up today for blood pressure control. Denies chest pain, dyspnea, orthopnea, hemoptysis, fever, n/v/d, edema. Feels some better, under stress at home.   November 11, 2009--Presents for 4 week HTN follow up - states still running a little high.  Dr. Sharl Ma decreased levothyroxine to to help.   TSH was nml, but b/p was increased after dose of levothyroxine was increased to 112 micrograms. B/p avg 140-150. Last visit norvasc was increased 10mg  and metoprolol 25mg  was added. Denies chest pain, dyspnea, orthopnea, hemoptysis, fever, n/v/d, edema, headache, tremor, exertional headache.   Medications Prior to Update: 1)  Mucinex Dm 30-600 Mg  Tb12 (Dextromethorphan-Guaifenesin) .[name].. 1-2 Tabs By  Mouth Every 12 Hours As Needed 2)  Aggrenox 25-200 Mg  Cp12 (Aspirin-Dipyridamole) .... Take 1 Tab By Mouth Two Times A Day... 3)  Norvasc 10 Mg Tabs (Amlodipine Besylate) .[name].. 1 By Mouth Once Daily 4)  Furosemide 20 Mg  Tabs (Furosemide) .... Take 1/2 Tab By Mouth Once Daily As Needed For Swelling... 5)  Simvastatin 20 Mg  Tabs (Simvastatin) .... Take 1 Tab By Mouth At Bedtime.Marland KitchenMarland Kitchen 6)  Levothyroxine Sodium 112 Mcg Tabs (Levothyroxine Sodium) .... Take 1 Tablet By Mouth Once A Day 7)  Calcium Carbonate-Vitamin D 600-400 Mg-Unit  Tabs (Calcium Carbonate-Vitamin D) .... 2 Tabs By Mouth Once Daily 8)  Centrum Silver   Tabs (Multiple Vitamins-Minerals) .... Take 1 Tablet By Mouth Once A Day 9)  Vitamin D 1000 Unit  Tabs (Cholecalciferol) .... One Tablet By Mouth Once Daily 10)  Cvs Vitamin E 400 Unit Caps (Vitamin E) .... Take 1 Tablet By Mouth Once A Day 11)  Metoprolol Succinate 25 Mg Xr24h-Tab (Metoprolol Succinate) .[name].. 1 By Mouth Once Daily  Current Medications (verified): 1)  Mucinex Dm 30-600 Mg  Tb12 (Dextromethorphan-Guaifenesin) .[name].. 1-2 Tabs By Mouth Every 12 Hours As Needed 2)  Aggrenox 25-200 Mg  Cp12 (Aspirin-Dipyridamole) .... Take 1 Tab By Mouth Two Times A Day... 3)  Norvasc 10 Mg Tabs (Amlodipine Besylate) .[name].. 1 By Mouth Once Daily 4)  Furosemide 20 Mg  Tabs (Furosemide) .... Take 1/2 Tab By Mouth Once Daily As Needed  For Swelling... 5)  Simvastatin 20 Mg  Tabs (Simvastatin) .... Take 1 Tab By Mouth At Bedtime.Marland KitchenMarland Kitchen 6)  Levothyroxine Sodium 100 Mcg Tabs (Levothyroxine Sodium) .... Take 1 Tablet By Mouth Once A Day 7)  Calcium Carbonate-Vitamin D 600-400 Mg-Unit  Tabs (Calcium Carbonate-Vitamin D) .... 2 Tabs By Mouth Once Daily 8)  Centrum Silver   Tabs (Multiple Vitamins-Minerals) .... Take 1 Tablet By Mouth Once A Day 9)  Vitamin D 1000 Unit  Tabs (Cholecalciferol) .... One Tablet By Mouth Once Daily 10)  Cvs Vitamin E 400 Unit Caps (Vitamin E) .... Take 1 Tablet By Mouth Once A  Day 11)  Metoprolol Succinate 25 Mg Xr24h-Tab (Metoprolol Succinate) .[name].. 1 By Mouth Once Daily  Allergies (verified): 1)  ! Sulfa  Past History:  Family History: Last updated: Mar 08, 2009 Father died age 71 w/ stroke & HBP Mother died age 50 w/ CHF, hx DM 7 Siblings:  brothers died w/ prostate cancer and lung cancer others alive w/ hx of DM, HBP, Chol...  Social History: Last updated: Mar 08, 2009 Married, husb= Doug, 60yrs. No children never smoked no alcohol retired- data entry for [employer]  Risk Factors: Smoking Status: never (08/28/2008)  Past Medical History:  HYPERTENSION (ICD-401.9) - on NORVASC 10mg /d (increased 10/16/09)  & LASIX 20mg - 1/2 tab daily...  , metoprolol 25mg  added (10/22/09), increased metoprolol 50mg  once daily (November 11, 2009)    CEREBROVASCULAR DISEASE (ICD-437.9) - on AGGRENOX Bid... 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 CDoppler's w/ non-obstructive plaque right carotid bulb, no signif ICA stenoses...  ~  f/u CDoppler 5/10 showed mild plaque bilat, 0-39% blat ICA stenoses... f/u 18yr.  HYPERLIPIDEMIA (ICD-272.4) - she was on Pravachol prev, and changed to SIMVASTATIN 20mg /d in 2008... labs below--- she stopped med Mar09 due to leg pains (further eval showed HNP)...   ~  pre-treatment FLP 3/04 showed TChol 282, TG 210, HDL 56, LDL 184... statin Rx started...  ~  f/u FLP's on Statin Rx were good:  TChol 150-166, TG 87-119, HDL 35-49, LDL 86-107...  ~  FLP 10/08 on Zocor20/d showed TChol 174, TG 136, HDL 47, LDL 99...  ~  FLP 4/09 off Zocor showed TChol 285, TG 192, HDL 56, LDL 200... rec- restart Zocor20.  ~  FLP 11/09 on Simva20 showed TChol 189, Tg 142, HDL 46, LDL 115... rec> continue same.  ~  FLP 4/10 on Simva20 showed TChol 193, TG 114, HDL 55, LDL 116... may need incr dose.  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 Endocrine eval by DrKerr9/10 w/ radioactive iodine therapy- 106 mCi I- 131 given and scan showed only uptake in neck.  ~  labs 4/10 showed TSH = 1.30  ~  9/10:  started on thyroid replacement by DrKerr- currently SYNTHROID 112 micrograms/d.  COLONIC POLYPS (ICD-211.3) - colonoscopy 8/09 by DrPerry w/ sev adenomatous polyps removed... f/u planned 5 yrs.  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  Review of Systems      See HPI  Vital Signs:  Patient profile:   70 year old female Height:  61 inches Weight:      127.13 pounds BMI:     24.11 O2 Sat:      97 % on Room air Temp:     97.5 degrees F oral Pulse rate:   82 / minute BP sitting:   142 / 66  (left arm) Cuff size:   regular  Vitals Entered By: Boone Master CNA (November 11, 2009 10:56 AM)  O2 Flow:  Room air CC: 4 week HTN follow up - states still running a little high.  Dr. Sharl Ma decreased levothyroxine to to help.  Is Patient Diabetic? No Comments Medications reviewed with patient Daytime contact number verified with patient. Boone Master CNA  November 11, 2009 10:56 AM    Physical Exam  Additional Exam:  WD, WN, 70 y/o WF in NAD... GENERAL:  Alert & oriented; pleasant & cooperative... HEENT:  [place]/AT, EOM-wnl, PERRLA, EACs-clear, TMs-wnl, NOSE-clear, THROAT-clear & wnl. NECK:  Supple w/ fairROM; no JVD; normal carotid impulses w/o bruits;scar of prev thyroid surg, no adenopathy. CHEST:  Clear to P & A; without wheezes/ rales/ or rhonchi. HEART:  Regular Rhythm; without murmurs/ rubs/ or gallops. ABDOMEN:  Soft & nontender; normal bowel sounds; no organomegaly or masses  detected. EXT: without deformities or arthritic changes; no varicose veins/ venous insuffic/ or edema. NEURO:  CN's intact; motor testing normal; sensory testing normal; gait normal & balance OK, no tremor.  DERM:  No lesions noted; no rash etc...     Impression & Recommendations:  Problem # 1:  HYPERTENSION (ICD-401.9)  Not at goal.  REC: Increase Metoprolol XR 50mg  once daily  Low salt diet  Keep  blood pressure log , check daily in am at rest -bring to next visit.  follow up 4 weeks Dr. Kriste Basque  Please contact office for sooner follow up if symptoms do not improve or worsen   Her updated medication list for this problem includes:    Norvasc 10 Mg Tabs (Amlodipine besylate) .[name]... 1 by mouth once daily    Furosemide 20 Mg Tabs (Furosemide) .[name]... Take 1/2 tab by mouth once daily as needed for swelling...    Metoprolol Succinate 50 Mg Xr24h-tab (Metoprolol succinate) .[name]... 1 by mouth once daily  Orders: Est. 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  for blood work in 2 weeks at Dr. Sharl Ma office   Her updated medication list for this problem includes:    Levothyroxine Sodium 100 Mcg Tabs (Levothyroxine sodium) .[name]... Take 1 tablet by mouth once a day  Orders: Est. Patient Level IV (44010)  Labs Reviewed: TSH: 1.30 (02/27/2009)    Chol: 193 (02/27/2009)   HDL: 54.70 (02/27/2009)   LDL: 116 (02/27/2009)   TG: 114.0 (02/27/2009)  Medications Added to Medication List This Visit: 1)  Levothyroxine Sodium 100 Mcg Tabs (Levothyroxine sodium) .... Take 1 tablet by mouth once a day 2)  Metoprolol Succinate 50 Mg Xr24h-tab (Metoprolol succinate) .[name].. 1 by mouth once daily  Complete Medication  List: 1)  Mucinex Dm 30-600 Mg Tb12 (Dextromethorphan-guaifenesin) .[name].. 1-2 tabs by mouth every 12 hours as needed 2)  Aggrenox 25-200 Mg Cp12 (Aspirin-dipyridamole) .... Take 1 tab by mouth two times a day... 3)  Norvasc 10 Mg  Tabs (Amlodipine besylate) .[name].. 1 by mouth once daily 4)  Furosemide 20 Mg Tabs (Furosemide) .... Take 1/2 tab by mouth once daily as needed for swelling... 5)  Simvastatin 20 Mg Tabs (Simvastatin) .... Take 1 tab by mouth at bedtime.Marland KitchenMarland Kitchen 6)  Levothyroxine Sodium 100 Mcg Tabs (Levothyroxine sodium) .... Take 1 tablet by mouth once a day 7)  Calcium Carbonate-vitamin D 600-400 Mg-unit Tabs (Calcium carbonate-vitamin d) .... 2 tabs by mouth once daily 8)  Centrum Silver Tabs (Multiple vitamins-minerals) .... Take 1 tablet by mouth once a day 9)  Vitamin D 1000 Unit Tabs (Cholecalciferol) .... One tablet by mouth once daily 10)  Cvs Vitamin E 400 Unit Caps (Vitamin e) .... Take 1 tablet by mouth once a day 11)  Metoprolol Succinate 50 Mg Xr24h-tab (Metoprolol succinate) .[name].. 1 by mouth once daily  Patient Instructions: 1)  Increase Metoprolol XR 50mg  once daily  2)  Low salt diet  3)  Keep  blood pressure log , check daily in am at rest -bring to next visit.  4)  follow up 4 weeks Dr. Kriste Basque  5)  Please contact office for sooner follow up if symptoms do not improve or worsen  Prescriptions: METOPROLOL SUCCINATE 50 MG XR24H-TAB (METOPROLOL SUCCINATE) 1 by mouth once daily  #30 x 0   Entered and Authorized by:   Rubye Oaks NP   Signed by:   Tammy Parrett NP on 11/11/2009   Method used:   Electronically to        [id number] (retail)       [address]       [place], [place]  [address]       Ph: [phone] or [phone]       Fax: [phone]   RxID:   [id number]

## 2010-12-10 NOTE — Miscellaneous (Signed)
Summary: [facility]  [facility]   Imported By: Lester Ford Cliff 12/02/2010 10:52:48  _____________________________________________________________________  External Attachment:    Type:   Image     Comment:   External Document

## 2011-01-14 LAB — THYROGLOBULIN ANTIBODY: Thyroglobulin Ab: <20 U/mL (ref <40.0)

## 2011-02-03 LAB — URINALYSIS, ROUTINE W REFLEX MICROSCOPIC
Glucose, UA: NEGATIVE mg/dL
Hgb urine dipstick: NEGATIVE
Ketones, ur: NEGATIVE mg/dL
Protein, ur: NEGATIVE mg/dL

## 2011-02-03 LAB — BASIC METABOLIC PANEL
CO2: 27 mEq/L (ref 19–32)
Calcium: 10.1 mg/dL (ref 8.4–10.5)
Creatinine, Ser: 0.6 mg/dL (ref 0.4–1.2)
GFR calc Af Amer: >60 mL/min (ref >60)

## 2011-02-03 LAB — POCT CARDIAC MARKERS: Troponin i, poc: <0.05 ng/mL (ref 0.00–0.09)

## 2011-02-08 LAB — DIFFERENTIAL
Basophils Absolute: 0 10*3/uL (ref 0.0–0.1)
Basophils Relative: 1 % (ref 0–1)
Neutro Abs: 3.7 10*3/uL (ref 1.7–7.7)
Neutrophils Relative %: 58 % (ref 43–77)

## 2011-02-08 LAB — COMPREHENSIVE METABOLIC PANEL
Alkaline Phosphatase: 56 U/L (ref 39–117)
BUN: 11 mg/dL (ref 6–23)
CO2: 32 mEq/L (ref 19–32)
Chloride: 104 mEq/L (ref 96–112)
Glucose, Bld: 94 mg/dL (ref 70–99)
Potassium: 5 mEq/L (ref 3.5–5.1)
Total Bilirubin: 0.7 mg/dL (ref 0.3–1.2)

## 2011-02-08 LAB — CBC
HCT: 41.5 % (ref 36.0–46.0)
Hemoglobin: 14.1 g/dL (ref 12.0–15.0)
WBC: 6.3 10*3/uL (ref 4.0–10.5)

## 2011-02-08 LAB — CALCIUM: Calcium: 9.7 mg/dL (ref 8.4–10.5)

## 2011-02-18 ENCOUNTER — Encounter: Payer: Self-pay | Admitting: Pulmonary Disease

## 2011-02-20 ENCOUNTER — Ambulatory Visit (INDEPENDENT_AMBULATORY_CARE_PROVIDER_SITE_OTHER): Payer: Medicare Other | Admitting: Pulmonary Disease

## 2011-02-20 ENCOUNTER — Other Ambulatory Visit: Payer: Self-pay | Admitting: Pulmonary Disease

## 2011-02-20 ENCOUNTER — Other Ambulatory Visit (INDEPENDENT_AMBULATORY_CARE_PROVIDER_SITE_OTHER): Payer: Medicare Other

## 2011-02-20 ENCOUNTER — Encounter: Payer: Self-pay | Admitting: Pulmonary Disease

## 2011-02-20 DIAGNOSIS — I679 Cerebrovascular disease, unspecified: Secondary | ICD-10-CM

## 2011-02-20 DIAGNOSIS — I10 Essential (primary) hypertension: Secondary | ICD-10-CM

## 2011-02-20 DIAGNOSIS — E032 Hypothyroidism due to medicaments and other exogenous substances: Secondary | ICD-10-CM

## 2011-02-20 DIAGNOSIS — D126 Benign neoplasm of colon, unspecified: Secondary | ICD-10-CM

## 2011-02-20 DIAGNOSIS — I059 Rheumatic mitral valve disease, unspecified: Secondary | ICD-10-CM

## 2011-02-20 DIAGNOSIS — G459 Transient cerebral ischemic attack, unspecified: Secondary | ICD-10-CM

## 2011-02-20 DIAGNOSIS — E785 Hyperlipidemia, unspecified: Secondary | ICD-10-CM

## 2011-02-20 DIAGNOSIS — M899 Disorder of bone, unspecified: Secondary | ICD-10-CM

## 2011-02-20 DIAGNOSIS — M949 Disorder of cartilage, unspecified: Secondary | ICD-10-CM

## 2011-02-20 DIAGNOSIS — C73 Malignant neoplasm of thyroid gland: Secondary | ICD-10-CM

## 2011-02-20 LAB — HEPATIC FUNCTION PANEL
ALT: 23 U/L (ref 0–35)
AST: 24 U/L (ref 0–37)
Alkaline Phosphatase: 53 U/L (ref 39–117)
Total Bilirubin: 0.6 mg/dL (ref 0.3–1.2)

## 2011-02-20 LAB — CBC WITH DIFFERENTIAL/PLATELET
Basophils Absolute: 0 10*3/uL (ref 0.0–0.1)
Eosinophils Absolute: 0.2 10*3/uL (ref 0.0–0.7)
HCT: 41.6 % (ref 36.0–46.0)
Lymphs Abs: 1.3 10*3/uL (ref 0.7–4.0)
MCHC: 34.6 g/dL (ref 30.0–36.0)
MCV: 90.4 fl (ref 78.0–100.0)
Monocytes Absolute: 0.5 10*3/uL (ref 0.1–1.0)
Monocytes Relative: 8.2 % (ref 3.0–12.0)
Platelets: 273 10*3/uL (ref 150.0–400.0)
RDW: 13.6 % (ref 11.5–14.6)

## 2011-02-20 LAB — TSH: TSH: 0.36 u[IU]/mL (ref 0.35–5.50)

## 2011-02-20 LAB — BASIC METABOLIC PANEL
BUN: 13 mg/dL (ref 6–23)
Chloride: 102 mEq/L (ref 96–112)
Potassium: 4.4 mEq/L (ref 3.5–5.1)

## 2011-02-20 LAB — LIPID PANEL
Cholesterol: 165 mg/dL (ref 0–200)
LDL Cholesterol: 91 mg/dL (ref 0–99)

## 2011-02-20 MED ORDER — ASPIRIN-DIPYRIDAMOLE ER 25-200 MG PO CP12: 1.0000 | ORAL_CAPSULE | Freq: Two times a day (BID) | ORAL | Status: DC

## 2011-02-20 MED ORDER — SIMVASTATIN 20 MG PO TABS: 20.0000 mg | ORAL_TABLET | Freq: Every day | ORAL | Status: DC

## 2011-02-20 NOTE — Patient Instructions (Signed)
Today we updated your med list in our new EPIC system...    We refilled the meds you requested...  Today we did your follow up fasting blood work...    Please call the PHONE TREE in a few days for your results...    Dial [id number] & when prompted enter your patient number followed by the # symbol...    Your patient number is:  [id number]  Keep up the good work w/ diet & exercise...    Call for any questions...    Let's plan a follow up visit in 6 months, sooner if needed.Marland KitchenMarland Kitchen

## 2011-02-20 NOTE — Progress Notes (Signed)
Subjective:    Patient ID: [name], female    DOB: 01-Mar-1941, 70 y.o.   MRN: [id number]  HPI 70 y/o WF here for a follow up visit... she has mult med problems as noted below>  ~  February 27, 2010:  BP remains well controlled on Metoprolol, Norvasc, Lasix... no cerebrovasc symptoms on Aggrenox & due for f/u CDopplers in May (stable mild carotid dis)... Chol also improved w/ her Simva20... she continues f/u w/ DrKerr for her Thyroid Ca...  ~  August 28, 2010:  she is sched for an I-131 total body scan tomorrow per DrKerr (Neg- no evid met dis)... recent thyroid blood tests were OK- thyroglob undetectable & TSH= 0.42 w/ FreeT4= 1.28 (0.61-1.12)... BP remains controlled on her 3 med regimen;  no CP, palpit, SOB, or cerebral ischemic symtpoms on her Aggrenox;  Lipids remain controlled w/ diet + Simva20;  otherw stable- no new complaints or concerns, refills written today, had Flu shot 9/11.  ~  February 20, 2011:  59mo ROV & she remains stable>  She saw DrKerr last week & Thyroid status is stable, TSH 0.36 on Synthroid100, Ultrasound shows no recurrent or resid thyroid tissue or adenopathy;  Also had CDoppler- stable, mild plaque in bulbs & 0-39% bilat ICA stenoses...  FLP looks good on Simva20; BP well controlled on meds; and no cerebral ischemic symptoms on the Aggrenox...        Problem List:  ALLERGIC RHINITIS (ICD-477.9) - we discussed Rx w/ Zyrtek, Saline, Mucinex OTC...  HYPERTENSION (ICD-401.9) - on METOPROLOL XL 50mg /d, NORVASC 10mg /d, & LASIX 20mg - 1/2 tab daily...   ~  10/11:  BP=134/86, doing well w/ med + home BP checks... denies HA, fatigue, visual changes, CP, palipit, dizziness, syncope, dyspnea, edema, etc... ~  4/12:  BP= 120/76 & she continues to remain asymptomatic...  MITRAL VALVE PROLAPSE (ICD-424.0) - clinical diagnosis in the past... no recent symptoms of CP or palpit... can't find prev 2DEcho...  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 neuro eval by  DrSethi w/ congenital hypoplastic right vertebral art... CDoppler's w/ non-obstructive plaque right carotid bulb, no signif ICA stenoses... ~  f/u CDoppler 5/10 showed mild plaque bilat, 0-39% blat ICA stenoses... f/u 48yr. ~  f/u CDoppler 5/11 showed mild plaque bilat, 0-39% bilat ICA stenoses, no change, f/u 12yr. ~  f/u CDoppler 4/12 showed mild plaque in bulbs, 0-39% bilat ICA stenoses, stable, repeat rec in 68yrs.  HYPERLIPIDEMIA (ICD-272.4) - on SIMVASTATIN 20mg /d... she tried off meds 3/09 due to leg pains- but further eval showed HNP...  ~  pre-treatment FLP 3/04 showed TChol 282, TG 210, HDL 56, LDL 184... statin Rx started... ~  f/u FLP's on Statin Rx were good:  TChol 150-166, TG 87-119, HDL 35-49, LDL 86-107... ~  FLP 10/08 on Zocor20/d showed TChol 174, TG 136, HDL 47, LDL 99... ~  FLP 4/09 off Zocor showed TChol 285, TG 192, HDL 56, LDL 200... rec- restart Zocor20. ~  FLP 11/09 on Simva20 showed TChol 189, Tg 142, HDL 46, LDL 115... rec> continue same. ~  FLP 4/10 on Simva20 showed TChol 193, TG 114, HDL 55, LDL 116... may need incr dose. 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 Endocrine eval by DrKerr 9/10 w/ radioactive iodine therapy- 106 mCi I- 131 given and scan showed only uptake in neck. ~  labs 4/10 showed TSH = 1.30 ~  9/10:  started on thyroid replacement by DrKerr- currently Synthroid 112 micrograms/d. ~  12/10: Synthroid decreased to 175mcg/d due to ?elevated BP?... labs followed by DrKerr. ~  9/11:  f/u DrKerr doing well- labs on SYNTHROID 117mcg/d= OK,  and I-131 whole body scan reported neg. ~  4/12:  She continues regular f/u DrKerr> TSH= 0.36, Thyropid Ultrasouns w/o recurrent or resid thyroid tissue seen...  COLONIC POLYPS (ICD-211.3) - last colonoscopy 8/09 by DrPerry w/ several adenomatous polyps removed... f/u planned 5 yrs.  BACK PAIN, LUMBAR (ICD-724.2) - eval DrDuda 4/09 w/ HNP L5-S1 and treated w/ epid steroid shot by DrNewton and improved...  OSTEOPENIA (ICD-733.90) - prev on Fosamax but this was stopped by GYN after last BMD (2008) at [facility] showed improved BMD... she takes Caltrate, Vits, Vit D... ~  labs 4/09 showed Vit D level = 33 therefore started Vit D OTC 11-1998 daly. ~  labs 4/10 showed Vit D level = 59 on 1000 u daily. ~  pt reports that f/u BMD 9/10 was "excellent"... ~  labs 4/11 showed Vit d level = 39... rec> continue 1000 u daily.  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 stable on AGGRENOX Bid... ~  4/12:  She continues w/o cerebral ischemic symptoms...  Skin Cancer - BCE removed from left ankle by DrNolan 8/10...  Health Maintenance:   ~  last colonoscopy 8/09 by DrPerry> see above. ~  GYN= DrRichardson... pt states she is seen every other yr & Mammogram/ BMD at [facility]. ~  Immunizations:  Tetanus/ TDAP updated 2010;  Pneumovax in 2006 at age 59;  she gets the Flu shots yearly...   Past Surgical History  Procedure Date  . Dilation and curettage of uterus   . Total thyroidectomy     Outpatient Encounter Prescriptions as of 02/20/2011  Medication Sig Dispense Refill  . amLODipine (NORVASC) 10 MG tablet Take 10 mg by mouth daily.        . Calcium Carbonate-Vitamin D (CALCIUM 600+D) 600-400 MG-UNIT per tablet Take 2 tablets by mouth daily.        . Cholecalciferol (VITAMIN D) 2000 UNITS CAPS Take 1 capsule by mouth daily.        [name] dextromethorphan-guaiFENesin (MUCINEX DM) 30-600 MG per 12 hr tablet Take 1  tablet by mouth every 12 (twelve) hours.        . dipyridamole-aspirin (AGGRENOX) 25-200 MG per 12 hr capsule Take 1 capsule by mouth 2 (two) times daily.        . furosemide (LASIX) 20 MG tablet Take 1/2 daily as needed for swelling       . levothyroxine (SYNTHROID, LEVOTHROID) 100 MCG tablet Take 100 mcg by mouth daily.        . metoprolol (TOPROL-XL) 50 MG 24 hr tablet Take 50 mg by mouth daily.        . Multiple Vitamins-Minerals (CENTRUM SILVER PO) Take 1 tablet by mouth daily.        . simvastatin (ZOCOR) 20 MG tablet Take 20 mg by mouth at bedtime.  Allergies  Allergen Reactions  . Sulfonamide Derivatives     REACTION: rash    Review of Systems        See HPI - all other systems neg except as noted... The patient denies anorexia, fever, weight loss, weight gain, vision loss, decreased hearing, hoarseness, chest pain, syncope, dyspnea on exertion, peripheral edema, prolonged cough, headaches, hemoptysis, abdominal pain, melena, hematochezia, severe indigestion/heartburn, hematuria, incontinence, muscle weakness, suspicious skin lesions, transient blindness, difficulty walking, depression, unusual weight change, abnormal bleeding, enlarged lymph nodes, and angioedema.     Objective:   Physical Exam     WD, WN, 70 y/o WF in NAD... GENERAL:  Alert & oriented; pleasant & cooperative... HEENT:  [place]/AT, EOM-wnl, PERRLA, EACs-clear, TMs-wnl, NOSE-clear, THROAT-clear & wnl. NECK:  Supple w/ fairROM; no JVD; normal carotid impulses w/o bruits; scar of prev thyroid surg, no adenopathy. CHEST:  Clear to P & A; without wheezes/ rales/ or rhonchi. HEART:  Regular Rhythm; without murmurs/ rubs/ or gallops. ABDOMEN:  Soft & nontender; normal bowel sounds; no organomegaly or masses detected. EXT: without deformities or arthritic changes; no varicose veins/ venous insuffic/ or edema. NEURO:  CN's intact; motor testing normal; sensory testing normal; gait normal & balance OK. DERM:  No  lesions noted; no rash etc...   Assessment & Plan:   HBP>  Controlled on meds, tol well, asymptomatic, continue same...  Cerebrovasc Dis & hx TIA>  She remains asymptomatic w/o cerebral ischemic symptoms, continue Agrennox...  HYPERLIPID>  Stable on diet + Simva20...  THYROID>  Followed by DrKerr, stable & doing well...  Other medical issues as noted.Marland KitchenMarland Kitchen

## 2011-02-21 LAB — VITAMIN D 25 HYDROXY (VIT D DEFICIENCY, FRACTURES): Vit D, 25-Hydroxy: 71 ng/mL (ref 30–89)

## 2011-02-23 ENCOUNTER — Other Ambulatory Visit: Payer: Self-pay | Admitting: Internal Medicine

## 2011-02-23 ENCOUNTER — Other Ambulatory Visit: Payer: Self-pay | Admitting: *Deleted

## 2011-02-23 DIAGNOSIS — G459 Transient cerebral ischemic attack, unspecified: Secondary | ICD-10-CM

## 2011-02-23 DIAGNOSIS — C73 Malignant neoplasm of thyroid gland: Secondary | ICD-10-CM

## 2011-02-24 ENCOUNTER — Encounter (INDEPENDENT_AMBULATORY_CARE_PROVIDER_SITE_OTHER): Payer: Medicare Other | Admitting: *Deleted

## 2011-02-24 DIAGNOSIS — G459 Transient cerebral ischemic attack, unspecified: Secondary | ICD-10-CM

## 2011-02-24 DIAGNOSIS — I6529 Occlusion and stenosis of unspecified carotid artery: Secondary | ICD-10-CM

## 2011-02-27 ENCOUNTER — Ambulatory Visit
Admission: RE | Admit: 2011-02-27 | Discharge: 2011-02-27 | Disposition: A | Discharge status: Home / Self Care | Payer: Medicare Other | Source: Ambulatory Visit | Attending: Internal Medicine | Admitting: Internal Medicine

## 2011-02-27 ENCOUNTER — Encounter: Payer: Self-pay | Admitting: Pulmonary Disease

## 2011-02-27 DIAGNOSIS — C73 Malignant neoplasm of thyroid gland: Secondary | ICD-10-CM

## 2011-03-04 ENCOUNTER — Encounter: Payer: Self-pay | Admitting: Pulmonary Disease

## 2011-03-17 NOTE — Op Note (Signed)
[name], [name]               ACCOUNT NO.:  [id number]   MEDICAL RECORD NO.:  [id number]          PATIENT TYPE:  AMB   LOCATION:  DAY                          FACILITY:  [facility]   PHYSICIAN:  Thomas A. Cornett, M.D.DATE OF BIRTH:  1941-07-06   DATE OF PROCEDURE:  05/27/2009  DATE OF DISCHARGE:                               OPERATIVE REPORT   PREOPERATIVE DIAGNOSIS:  Right thyroid nodule.   POSTOPERATIVE DIAGNOSIS:  Right thyroid nodule.   PROCEDURE:  Total thyroidectomy.   SURGEON:  Maisie Fus A. Cornett, MD.   ASSISTANT:  Glenna Fellows, MD.   ANESTHESIA:  General endotracheal anesthesia.   ESTIMATED BLOOD LOSS:  30 mL.   SPECIMEN:  Thyroid gland with dominant right thyroid nodule indicated by  a single stitch.   DRAINS:  None.   INDICATIONS FOR PROCEDURE:  The patient is a very pleasant 70 year old  female with a calcified right thyroid lobe nodule by ultrasound.  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  This was felt to be  suspicious with calcifications noted on the ultrasound as well.  I  recommended resection of the right lobe, possible total thyroidectomy  depending on what was found intraoperatively.  Risks of the procedure  were discussed and are outlined in the history and physical.  She agreed  to proceed.   DESCRIPTION OF PROCEDURE:  The patient was brought to the operating room  and placed supine.  After induction of general anesthesia, both arms  were tucked and her neck was extended.  It was prepped and draped in a  sterile fashion.  A marker was used to mark a skin crease two  fingerbreadths above the sternal notch.  A transverse cervical incision  was made down through the platysma muscle.  Superior and inferior flaps  were raised until the strap muscles were identified.  The strap muscles  were then divided in the midline and retracted laterally.  Right lobe  was done first.  There was a calcified nodule in the right upper the  lobe which  measured about a centimeter and a half.  The superior pole  was then taken down using a combination of the Harmonic scalpel and 3-0  Vicryl ties with clips until the superior pole was removed, these were  taken down.  The middle thyroid vein was also divided between clips and  the Harmonic scalpel.  We rolled the gland up and then mobilized the  inferior thyroid pedicle and took the individual vessels between clips  and the Harmonic scalpel.  The superior and inferior parathyroid glands  were identified and preserved.  We then continued to roll the gland up  out the tracheoesophageal groove.  The recurrent nerve was well away  from this.  Once the entire gland was mobilized up, we amputated,  leaving a small amount of thyroid tissue over top of the nerve.  We then  used the cautery to dissect it off the trachea.  This was then sent with  the isthmus to pathology.  Unfortunately, given the hard nature of this,  frozen section could not be  done, which raised my concern of this being  a potential malignancy.  I went ahead and felt a completion  thyroidectomy is warranted given the way this felt, looked and my  suspicion of this being an invasive thyroid cancer.   The left thyroid lobe was then done.  In a similar fashion the superior  pole vessels were taken down in between ties and clips and Harmonic  scalpel.  We identified the superior parathyroid gland and preserved it.  The inferior pole was also mobilized in a similar fashion using clips  and Harmonic scalpel.  The gland rolled up very nicely and I could  identify the recurrent nerve on this side, I was able to dissect the  gland away from it carefully, leaving the nerve in place, undisturbed.  We again left a small amount of thyroid tissue overlying the nerve and  used the cautery to dissect this off the trachea and passed it off the  field.  Irrigation was used and suctioned out.  Hemostasis was  excellent.  Surgicel was placed on  both sides and the strap muscles  approximated using 3-0 Vicryl.  This was closed with 3-0 Vicryl and 4-0  Monocryl was used to close skin in a subcuticular fashion.  Dermabond  was applied.  All final counts of sponge, needle and instruments were  found to be correct in this portion of the case.  The patient was then  awoke, taken to the recovery room in satisfactory condition.      Thomas A. Cornett, M.D.  Electronically Signed     TAC/MEDQ  D:  05/27/2009  T:  05/27/2009  Job:  [id number]   cc:   Lonzo Cloud. Kriste Basque, MD  [address]  [place]  [place] [address]

## 2011-07-10 ENCOUNTER — Encounter: Payer: Self-pay | Admitting: Internal Medicine

## 2011-07-20 ENCOUNTER — Ambulatory Visit (INDEPENDENT_AMBULATORY_CARE_PROVIDER_SITE_OTHER): Payer: Medicare Other

## 2011-07-20 ENCOUNTER — Encounter: Payer: Self-pay | Admitting: Internal Medicine

## 2011-07-20 DIAGNOSIS — Z23 Encounter for immunization: Secondary | ICD-10-CM

## 2011-08-17 ENCOUNTER — Encounter: Payer: Self-pay | Admitting: *Deleted

## 2011-08-20 ENCOUNTER — Ambulatory Visit (INDEPENDENT_AMBULATORY_CARE_PROVIDER_SITE_OTHER): Payer: Medicare Other | Admitting: Internal Medicine

## 2011-08-20 ENCOUNTER — Encounter: Payer: Self-pay | Admitting: Internal Medicine

## 2011-08-20 VITALS — BP 124/62 | HR 68 | Ht 61.0 in | Wt 127.0 lb

## 2011-08-20 DIAGNOSIS — Z86010 Personal history of colonic polyps: Secondary | ICD-10-CM

## 2011-08-20 DIAGNOSIS — I679 Cerebrovascular disease, unspecified: Secondary | ICD-10-CM

## 2011-08-20 NOTE — Progress Notes (Signed)
HISTORY OF PRESENT ILLNESS:  [name] is a 70 y.o. female with multiple significant medical problems who presents today regarding surveillance colonoscopy. Her last colonoscopy was performed in August of 2009. This revealed for adenomatous colon polyps. Followup in 3 years recommended. She presents to the office today because of a history of TIA for which she is on Aggrenox therapy. The patient's GI review of systems is entirely normal or negative. Her chronic medical problems are stable. She is interested in surveillance colonoscopy. No problems previously.  REVIEW OF SYSTEMS:  All non-GI ROS negative except for arthritis, back pain, visual change due to cataracts, occasional insomnia, and urinary leakage with Valsalva.  Past Medical History  Diagnosis Date  . Allergic rhinitis, cause unspecified   . Unspecified essential hypertension   . Mitral valve disorders   . Cerebrovascular disease, unspecified   . Other and unspecified hyperlipidemia   . Malignant neoplasm of thyroid gland   . Other iatrogenic hypothyroidism   . Benign neoplasm of colon   . Lumbago   . Disorder of bone and cartilage, unspecified   . Unspecified transient cerebral ischemia   . Colon polyps 2009    x 5     Past Surgical History  Procedure Date  . Dilation and curettage of uterus   . Total thyroidectomy     Social History [name]  reports that she has never smoked. She has never used smokeless tobacco. She reports that she does not drink alcohol or use illicit drugs.  family history includes Bone cancer in her brother; Colon polyps in her sister; Heart disease in her mother; Hypertension in her father; Lung cancer in her brother; and Prostate cancer in her brother.  There is no history of Colon cancer.  Allergies  Allergen Reactions  . Sulfonamide Derivatives     REACTION: rash       PHYSICAL EXAMINATION: Vital signs: BP 124/62  Pulse 68  Ht 5\' 1"  (1.549 m)  Wt 127 lb (57.607 kg)   BMI 24.00 kg/m2  Constitutional: generally well-appearing, no acute distress Psychiatric: alert and oriented x3, cooperative Eyes: extraocular movements intact, anicteric, conjunctiva pink Mouth: oral pharynx moist, no lesions Neck: supple no lymphadenopathy Cardiovascular: heart regular rate and rhythm, no murmur Lungs: clear to auscultation bilaterally Abdomen: soft, nontender, nondistended, no obvious ascites, no peritoneal signs, normal bowel sounds, no organomegaly Rectal: Deferred until colonoscopy Extremities: no lower extremity edema bilaterally Skin: no lesions on visible extremities Neuro: No focal deficits.   ASSESSMENT:  #1. History of multiple adenomatous polyps. Due for surveillance. #2. Multiple medical problems. Stable #3. On Aggrenox therapy for cerebrovascular disease   PLAN:  #1. Colonoscopy. Patient is high risk due to her comorbidities and medications.The nature of the procedure, as well as the risks, benefits, and alternatives were carefully and thoroughly reviewed with the patient. Ample time for discussion and questions allowed. The patient understood, was satisfied, and agreed to proceed.  Movi prep prescribed. She has been instructed to continue Aggrenox. Risk/ benefit profile of this strategy discussed with patient. She understands

## 2011-08-20 NOTE — Patient Instructions (Signed)
Please call and schedule your Colonoscopy the beginning of November.  Our schedule should be available for December at that time. You will be scheduled for a pre-visit with the nurse 1 week prior to procedure date to go over your instructions.

## 2011-08-24 ENCOUNTER — Encounter: Payer: Self-pay | Admitting: Internal Medicine

## 2011-09-01 ENCOUNTER — Other Ambulatory Visit: Payer: Self-pay | Admitting: Pulmonary Disease

## 2011-09-02 ENCOUNTER — Telehealth: Payer: Self-pay | Admitting: Pulmonary Disease

## 2011-09-02 MED ORDER — AMOXICILLIN-POT CLAVULANATE 875-125 MG PO TABS: 1.0000 | ORAL_TABLET | Freq: Two times a day (BID) | ORAL | Status: AC

## 2011-09-02 NOTE — Telephone Encounter (Signed)
Per SN--ok for augmentin 875mg   1 po bid until gone---and use mucinex 2 po bid with plenty of fluids.  #14.  Called and spoke with pt and she is aware of meds sent to the pharmacy

## 2011-09-02 NOTE — Telephone Encounter (Signed)
Spoke with pt. She is c/o PND, scratchy throat, and prod cough with green/yellow sputum x 2 days. No fever, chills, sweats or other complaints. Would like something called to pharm. She is taking zyrtec and otc tussin syrup w/o relief. Please advise, thanks! Allergies  Allergen Reactions  . Sulfonamide Derivatives     REACTION: rash

## 2011-09-11 ENCOUNTER — Other Ambulatory Visit: Payer: Self-pay | Admitting: Pulmonary Disease

## 2011-09-22 ENCOUNTER — Other Ambulatory Visit: Payer: Self-pay | Admitting: Surgery

## 2011-10-02 ENCOUNTER — Encounter: Payer: Self-pay | Admitting: Pulmonary Disease

## 2011-10-02 ENCOUNTER — Ambulatory Visit (INDEPENDENT_AMBULATORY_CARE_PROVIDER_SITE_OTHER): Payer: Medicare Other | Admitting: Pulmonary Disease

## 2011-10-02 ENCOUNTER — Other Ambulatory Visit (INDEPENDENT_AMBULATORY_CARE_PROVIDER_SITE_OTHER): Payer: Medicare Other

## 2011-10-02 DIAGNOSIS — E785 Hyperlipidemia, unspecified: Secondary | ICD-10-CM

## 2011-10-02 DIAGNOSIS — I679 Cerebrovascular disease, unspecified: Secondary | ICD-10-CM

## 2011-10-02 DIAGNOSIS — I10 Essential (primary) hypertension: Secondary | ICD-10-CM

## 2011-10-02 DIAGNOSIS — C4490 Unspecified malignant neoplasm of skin, unspecified: Secondary | ICD-10-CM

## 2011-10-02 DIAGNOSIS — M545 Low back pain, unspecified: Secondary | ICD-10-CM

## 2011-10-02 DIAGNOSIS — D126 Benign neoplasm of colon, unspecified: Secondary | ICD-10-CM

## 2011-10-02 DIAGNOSIS — C73 Malignant neoplasm of thyroid gland: Secondary | ICD-10-CM

## 2011-10-02 DIAGNOSIS — G459 Transient cerebral ischemic attack, unspecified: Secondary | ICD-10-CM

## 2011-10-02 LAB — LIPID PANEL
LDL Cholesterol: 98 mg/dL (ref 0–99)
Total CHOL/HDL Ratio: 3
VLDL: 21 mg/dL (ref 0.0–40.0)

## 2011-10-02 MED ORDER — ASPIRIN-DIPYRIDAMOLE ER 25-200 MG PO CP12: 1.0000 | ORAL_CAPSULE | Freq: Two times a day (BID) | ORAL | Status: DC

## 2011-10-02 MED ORDER — SIMVASTATIN 20 MG PO TABS: 20.0000 mg | ORAL_TABLET | Freq: Every day | ORAL | Status: DC

## 2011-10-02 MED ORDER — FUROSEMIDE 20 MG PO TABS: ORAL_TABLET | ORAL | Status: DC

## 2011-10-02 MED ORDER — METOPROLOL SUCCINATE ER 50 MG PO TB24: 50.0000 mg | ORAL_TABLET | Freq: Every day | ORAL | Status: DC

## 2011-10-02 MED ORDER — AMLODIPINE BESYLATE 10 MG PO TABS: 10.0000 mg | ORAL_TABLET | Freq: Every day | ORAL | Status: DC

## 2011-10-02 NOTE — Patient Instructions (Signed)
Today we updated your med list in our EPIC system...    Continue your current medications the same...  Today we did your follow up fasting blood work...    Please call the PHONE TREE in a few days for your results...    Dial [id number] & when prompted enter your patient number followed by the # symbol...    Your patient number is:  [id number]#  Keep up the great job w/ diet/ exercise/ etc...  Call for any questions...  Let's plan our full eval in about 6 month's time.Marland KitchenMarland Kitchen

## 2011-10-02 NOTE — Progress Notes (Signed)
Subjective:    Patient ID: [name], female    DOB: 1940-12-11, 70 y.o.   MRN: [id number]  HPI 70 y/o WF here for a follow up visit... she has mult med problems as noted below>  ~  February 27, 2010:  BP remains well controlled on Metoprolol, Norvasc, Lasix... no cerebrovasc symptoms on Aggrenox & due for f/u CDopplers in May (stable mild carotid dis)... Chol also improved w/ her Simva20... she continues f/u w/ DrKerr for her Thyroid Ca...  ~  August 28, 2010:  she is sched for an I-131 total body scan tomorrow per DrKerr (Neg- no evid met dis)... recent thyroid blood tests were OK- thyroglob undetectable & TSH= 0.42 w/ FreeT4= 1.28 (0.61-1.12)... BP remains controlled on her 3 med regimen;  no CP, palpit, SOB, or cerebral ischemic symtpoms on her Aggrenox;  Lipids remain controlled w/ diet + Simva20;  otherw stable- no new complaints or concerns, refills written today, had Flu shot 9/11.  ~  February 20, 2011:  71mo ROV & she remains stable>  She saw DrKerr last week & Thyroid status is stable, TSH 0.36 on Synthroid100, Ultrasound shows no recurrent or resid thyroid tissue or adenopathy;  Also had CDoppler- stable, mild plaque in bulbs & 0-39% bilat ICA stenoses...  FLP looks good on Simva20; BP well controlled on meds; and no cerebral ischemic symptoms on the Aggrenox...  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 BP remains controlled on Metop, Amlodip, Furosem;  She is asymptomatic w/o CP, palpit, dizzy, SOB, edema, or cerebral ischemic symptoms on her Aggrenox Bid;  Chol looks good on Simva20- see prob list below>> she had flu vaccine in Sept...        Problem List:  ALLERGIC RHINITIS (ICD-477.9) - we discussed Rx w/ Zyrtek, Saline, Mucinex OTC...  HYPERTENSION (ICD-401.9) -  on METOPROLOL XL 50mg /d, NORVASC 10mg /d, & LASIX 20mg - 1/2 tab daily...   ~  10/11:  BP=134/86, doing well w/ med + home BP checks... denies HA, fatigue, visual changes, CP, palipit, dizziness, syncope, dyspnea, edema, etc... ~  4/12:  BP= 120/76 & she continues to remain asymptomatic... ~  11/12:  BP= 122/72 & she continues stable w/o  CP, palpit, dizzy, SOB, edema, or cerebral ischemic symptoms on her Aggrenox Bid.  MITRAL VALVE PROLAPSE (ICD-424.0) - clinical diagnosis in the past... no recent symptoms of CP or palpit... can't find prev 2DEcho...  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 CDoppler's w/ non-obstructive plaque right carotid bulb, no signif ICA stenoses... ~  f/u CDoppler 5/10 showed mild plaque bilat, 0-39% blat ICA stenoses... f/u 35yr. ~  f/u CDoppler 5/11 showed mild plaque bilat, 0-39% bilat ICA stenoses, no change, f/u 53yr. ~  f/u CDoppler 4/12 showed mild plaque in bulbs, 0-39% bilat ICA stenoses, stable, repeat rec in 6yrs.  HYPERLIPIDEMIA (ICD-272.4) - on SIMVASTATIN 20mg /d... she tried off meds 3/09 due to leg pains- but further eval showed HNP...  ~  pre-treatment FLP 3/04 showed TChol 282, TG 210, HDL 56, LDL 184... statin Rx started... ~  f/u FLP's on Statin Rx were good:  TChol 150-166, TG 87-119, HDL 35-49, LDL 86-107... ~  FLP 10/08 on Zocor20/d showed  TChol 174, TG 136, HDL 47, LDL 99... ~  FLP 4/09 off Zocor showed TChol 285, TG 192, HDL 56, LDL 200... rec- restart Zocor20. ~  FLP 11/09 on Simva20 showed TChol 189, Tg 142, HDL 46, LDL 115... rec> continue same. ~  FLP 4/10 on Simva20 showed TChol 193, TG 114, HDL 55, LDL 116... may need incr dose. 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 Endocrine eval by DrKerr 9/10 w/ radioactive iodine therapy- 106 mCi I- 131 given and scan showed only uptake in neck. ~  labs 4/10 showed TSH = 1.30 ~  9/10:  started on thyroid replacement by DrKerr- currently Synthroid 112 micrograms/d. ~  12/10: Synthroid decreased to 171mcg/d due to ?elevated BP?... labs followed by DrKerr. ~  9/11:  f/u DrKerr doing well- labs on SYNTHROID 138mcg/d= OK, and I-131 whole body scan reported neg. ~  4/12:  She continues regular f/u DrKerr> TSH= 0.36, Thyroid Ultrasound w/o recurrent or resid thyroid tissue seen... ~  She continues regular Q35mo follow up visits w/ DrKerr...  COLONIC POLYPS (ICD-211.3) - last colonoscopy 8/09 by DrPerry w/ several adenomatous polyps removed... f/u planned 5 yrs.  BACK PAIN, LUMBAR (ICD-724.2) - eval DrDuda 4/09 w/ HNP L5-S1 and treated w/ epid steroid shot by DrNewton and improved...  OSTEOPENIA (ICD-733.90) - prev on Fosamax but this was stopped by GYN after last BMD (2008) at [facility] showed improved BMD... she takes Caltrate, Vits, Vit D... ~  labs 4/09 showed Vit D level = 33 therefore started Vit D OTC 11-1998 daly. ~  labs 4/10 showed Vit D level = 59 on 1000 u daily. ~  pt reports that f/u BMD 9/10 was "excellent"... ~  labs 4/11 showed Vit d level = 39... rec> continue 1000 u daily.  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 stable on AGGRENOX Bid... ~  4/12:  She continues w/o cerebral ischemic symptoms...  Skin Cancer - BCE removed from left ankle by DrNolan 8/10... Another removed from nose & followed by Moh's surg...  Health  Maintenance:   ~  last colonoscopy 8/09 by DrPerry> see above. ~  GYN= DrRichardson... pt states she is seen every other yr & Mammogram/ BMD at [facility]. ~  Immunizations:  Tetanus/ TDAP updated 2010;  Pneumovax in 2006 at age 12;  she gets the Flu shots yearly...   Past Surgical History  Procedure Date  . Dilation and curettage of uterus   . Total thyroidectomy     Outpatient Encounter Prescriptions as of 10/02/2011  Medication Sig Dispense Refill  . amLODipine (NORVASC) 10 MG tablet TAKE 1 TABLET EVERY DAY  30 tablet  0  . Calcium Carbonate-Vitamin D (CALCIUM 600+D) 600-400 MG-UNIT per tablet Take 2 tablets by mouth daily.        . cetirizine (ZYRTEC) 5 MG tablet Take 5 mg by mouth as needed.        [name]  Cholecalciferol (VITAMIN D) 2000 UNITS CAPS Take 1 capsule by mouth daily.        [name] dextromethorphan-guaiFENesin (MUCINEX DM) 30-600 MG per 12 hr tablet Take 1 tablet by mouth as needed.       . dipyridamole-aspirin (AGGRENOX) 25-200 MG per 12 hr capsule Take 1 capsule by mouth 2 (two) times daily.  60 capsule  11  . furosemide (LASIX) 20 MG tablet Take 1/2 daily as needed for swelling       . levothyroxine (SYNTHROID, LEVOTHROID) 100 MCG tablet Take 100 mcg by mouth daily.        . metoprolol (TOPROL-XL) 50 MG 24 hr tablet TAKE 1 TABLET BY MOUTH EVERY DAY  30 tablet  5  . Multiple Vitamins-Minerals (CENTRUM SILVER PO) Take 1 tablet by mouth daily.        . simvastatin (ZOCOR) 20 MG tablet Take 1 tablet (20 mg total) by mouth at bedtime.  30 tablet  11    Allergies  Allergen Reactions  . Sulfonamide Derivatives     REACTION: rash    Current Medications, Allergies, Past Medical History, Past Surgical History, Family History, and Social History were reviewed in [employer] record.    Review of Systems        See HPI - all other systems neg except as noted... The patient denies anorexia, fever, weight loss, weight gain, vision loss, decreased hearing,  hoarseness, chest pain, syncope, dyspnea on exertion, peripheral edema, prolonged cough, headaches, hemoptysis, abdominal pain, melena, hematochezia, severe indigestion/heartburn, hematuria, incontinence, muscle weakness, suspicious skin lesions, transient blindness, difficulty walking, depression, unusual weight change, abnormal bleeding, enlarged lymph nodes, and angioedema.     Objective:   Physical Exam     WD, WN, 70 y/o WF in NAD... GENERAL:  Alert & oriented; pleasant & cooperative... HEENT:  [place]/AT, EOM-wnl, PERRLA, EACs-clear, TMs-wnl, NOSE-clear, THROAT-clear & wnl. NECK:  Supple w/ fairROM; no JVD; normal carotid impulses w/o bruits; scar of prev thyroid surg, no nodules, no adenopathy. CHEST:  Clear to P & A; without wheezes/ rales/ or rhonchi. HEART:  Regular Rhythm; without murmurs/ rubs/ or gallops. ABDOMEN:  Soft & nontender; normal bowel sounds; no organomegaly or masses detected. EXT: without deformities or arthritic changes; no varicose veins/ venous insuffic/ or edema. NEURO:  CN's intact; motor testing normal; sensory testing normal; gait normal & balance OK. DERM:  No lesions noted; no rash etc...   Assessment & Plan:   HBP>  Controlled on meds, tol well, asymptomatic, continue same...  Cerebrovasc Dis & hx TIA>  She remains asymptomatic w/o cerebral ischemic symptoms, continue Agrennox...  HYPERLIPID>  Stable on diet + Simva20...  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  GI> Hx polyps>  She is up to date and f/u colonoscopy due 8/14...  Osteopenia>  Managed by GYN DrRichardson & pt reports that her last BMD was "excellent"...  Skin Cancer>  She reports another basal cell cancer, this time on her nose & she is sched for Moh's surg...  Other medical issues as noted.Marland KitchenMarland Kitchen

## 2011-10-04 ENCOUNTER — Encounter: Payer: Self-pay | Admitting: Pulmonary Disease

## 2011-10-07 ENCOUNTER — Ambulatory Visit (AMBULATORY_SURGERY_CENTER): Payer: Medicare Other | Admitting: *Deleted

## 2011-10-07 VITALS — Ht 61.0 in | Wt 129.7 lb

## 2011-10-07 DIAGNOSIS — Z1211 Encounter for screening for malignant neoplasm of colon: Secondary | ICD-10-CM

## 2011-10-07 MED ORDER — MOVIPREP 100 G PO SOLR: ORAL | Status: DC

## 2011-10-21 ENCOUNTER — Ambulatory Visit (AMBULATORY_SURGERY_CENTER): Payer: Medicare Other | Admitting: Internal Medicine

## 2011-10-21 ENCOUNTER — Encounter: Payer: Self-pay | Admitting: Internal Medicine

## 2011-10-21 VITALS — BP 134/64 | HR 75 | Temp 97.1°F | Resp 19 | Ht 61.0 in | Wt 129.0 lb

## 2011-10-21 DIAGNOSIS — D126 Benign neoplasm of colon, unspecified: Secondary | ICD-10-CM

## 2011-10-21 DIAGNOSIS — Z1211 Encounter for screening for malignant neoplasm of colon: Secondary | ICD-10-CM

## 2011-10-21 DIAGNOSIS — Z86010 Personal history of colonic polyps: Secondary | ICD-10-CM

## 2011-10-21 MED ORDER — SODIUM CHLORIDE 0.9 % IV SOLN: 500.0000 mL | INTRAVENOUS | Status: DC

## 2011-10-21 NOTE — Patient Instructions (Signed)
Discharge instructions given with verbal understanding. Handout on polyps given. Resume previous medications. 

## 2011-10-21 NOTE — Progress Notes (Signed)
Patient did not experience any of the following events: a burn prior to discharge; a fall within the facility; wrong site/side/patient/procedure/implant event; or a hospital transfer or hospital admission upon discharge from the facility. (G8907) Patient did not have preoperative order for IV antibiotic SSI prophylaxis. (G8918)  

## 2011-10-21 NOTE — Op Note (Signed)
[facility] [address] [place], [place]  [address]  COLONOSCOPY PROCEDURE REPORT  PATIENT:  Katherine, Bush  MR#:  [id number] BIRTHDATE:  07-28-1941, 70 yrs. old  GENDER:  female ENDOSCOPIST:  Wilhemina Bonito. Eda Keys, MD REF. BY:  Screening Recall PROCEDURE DATE:  10/21/2011 PROCEDURE:  Colonoscopy with snare polypectomy x 2 ASA CLASS:  Class II INDICATIONS:  history of pre-cancerous (adenomatous) colon polyps, surveillance and high-risk screening ; index 06-2008 w/ > 3 adenomas MEDICATIONS:   Fentanyl 75 mcg IV, Versed 7 mg IV, These medications were titrated to patient response per physician's verbal order  DESCRIPTION OF PROCEDURE:   After the risks benefits and alternatives of the procedure were thoroughly explained, informed consent was obtained.  Digital rectal exam was performed and revealed no abnormalities.   The LB 180AL [id number] endoscope was introduced through the anus and advanced to the cecum, which was identified by both the appendix and ileocecal valve, without limitations.  The quality of the prep was excellent, using MoviPrep.  The instrument was then slowly withdrawn as the colon was fully examined. <<PROCEDUREIMAGES>>  FINDINGS:  Two polyps (1mm, 5mm) were found in the ascending colon and were snared without cautery. Retrieval was successful. Otherwise normal colonoscopy without other polyps, masses, vascular ectasias, or inflammatory changes.   Retroflexed views in the rectum revealed no abnormalities.    The time to cecum =1:58 minutes. The scope was then withdrawn in  9:56  minutes from the cecum and the procedure completed.  COMPLICATIONS:  None  ENDOSCOPIC IMPRESSION: 1) Two polyps in the ascending colon - removed 2) Otherwise normal colonoscopy  RECOMMENDATIONS: 1) Follow up colonoscopy in 5 years  ______________________________ Wilhemina Bonito. Eda Keys, MD  CC:  Michele Mcalpine, MD; The Patient  n. eSIGNED:   Wilhemina Bonito. Eda Keys at 10/21/2011  11:08 AM  Loman [place], [phone]

## 2011-10-22 ENCOUNTER — Telehealth: Payer: Self-pay

## 2011-10-22 NOTE — Telephone Encounter (Signed)

## 2011-11-10 ENCOUNTER — Encounter: Payer: Self-pay | Admitting: Pulmonary Disease

## 2011-11-10 DIAGNOSIS — Z124 Encounter for screening for malignant neoplasm of cervix: Secondary | ICD-10-CM | POA: Diagnosis not present

## 2011-11-10 DIAGNOSIS — Z01419 Encounter for gynecological examination (general) (routine) without abnormal findings: Secondary | ICD-10-CM | POA: Diagnosis not present

## 2011-11-17 ENCOUNTER — Encounter: Payer: Self-pay | Admitting: Pulmonary Disease

## 2011-11-24 DIAGNOSIS — Z85828 Personal history of other malignant neoplasm of skin: Secondary | ICD-10-CM | POA: Diagnosis not present

## 2012-02-04 ENCOUNTER — Other Ambulatory Visit: Payer: Self-pay | Admitting: Internal Medicine

## 2012-02-04 DIAGNOSIS — C73 Malignant neoplasm of thyroid gland: Secondary | ICD-10-CM

## 2012-02-09 ENCOUNTER — Ambulatory Visit
Admission: RE | Admit: 2012-02-09 | Discharge: 2012-02-09 | Disposition: A | Discharge status: Home / Self Care | Payer: Medicare Other | Source: Ambulatory Visit | Attending: Internal Medicine | Admitting: Internal Medicine

## 2012-02-09 DIAGNOSIS — C73 Malignant neoplasm of thyroid gland: Secondary | ICD-10-CM | POA: Diagnosis not present

## 2012-02-22 DIAGNOSIS — C73 Malignant neoplasm of thyroid gland: Secondary | ICD-10-CM | POA: Diagnosis not present

## 2012-03-07 DIAGNOSIS — E890 Postprocedural hypothyroidism: Secondary | ICD-10-CM | POA: Diagnosis not present

## 2012-03-07 DIAGNOSIS — C73 Malignant neoplasm of thyroid gland: Secondary | ICD-10-CM | POA: Diagnosis not present

## 2012-03-08 DIAGNOSIS — Z85828 Personal history of other malignant neoplasm of skin: Secondary | ICD-10-CM | POA: Diagnosis not present

## 2012-03-08 DIAGNOSIS — D485 Neoplasm of uncertain behavior of skin: Secondary | ICD-10-CM | POA: Diagnosis not present

## 2012-03-30 DIAGNOSIS — M5430 Sciatica, unspecified side: Secondary | ICD-10-CM | POA: Diagnosis not present

## 2012-03-30 DIAGNOSIS — B029 Zoster without complications: Secondary | ICD-10-CM | POA: Diagnosis not present

## 2012-03-31 ENCOUNTER — Ambulatory Visit (INDEPENDENT_AMBULATORY_CARE_PROVIDER_SITE_OTHER): Payer: Medicare Other | Admitting: Pulmonary Disease

## 2012-03-31 ENCOUNTER — Encounter: Payer: Self-pay | Admitting: Pulmonary Disease

## 2012-03-31 VITALS — BP 138/72 | HR 71 | Temp 97.6°F | Ht 62.0 in | Wt 128.4 lb

## 2012-03-31 DIAGNOSIS — C4490 Unspecified malignant neoplasm of skin, unspecified: Secondary | ICD-10-CM

## 2012-03-31 DIAGNOSIS — I10 Essential (primary) hypertension: Secondary | ICD-10-CM | POA: Diagnosis not present

## 2012-03-31 DIAGNOSIS — C73 Malignant neoplasm of thyroid gland: Secondary | ICD-10-CM

## 2012-03-31 DIAGNOSIS — I679 Cerebrovascular disease, unspecified: Secondary | ICD-10-CM

## 2012-03-31 DIAGNOSIS — M899 Disorder of bone, unspecified: Secondary | ICD-10-CM

## 2012-03-31 DIAGNOSIS — E785 Hyperlipidemia, unspecified: Secondary | ICD-10-CM

## 2012-03-31 DIAGNOSIS — D126 Benign neoplasm of colon, unspecified: Secondary | ICD-10-CM

## 2012-03-31 DIAGNOSIS — I059 Rheumatic mitral valve disease, unspecified: Secondary | ICD-10-CM

## 2012-03-31 DIAGNOSIS — E032 Hypothyroidism due to medicaments and other exogenous substances: Secondary | ICD-10-CM

## 2012-03-31 DIAGNOSIS — B029 Zoster without complications: Secondary | ICD-10-CM | POA: Insufficient documentation

## 2012-03-31 MED ORDER — AMLODIPINE BESYLATE 10 MG PO TABS: 10.0000 mg | ORAL_TABLET | Freq: Every day | ORAL | Status: DC

## 2012-03-31 NOTE — Patient Instructions (Signed)
Today we updated your med list in our EPIC system...    Continue your current medications the same...    We refilled the meds you requested...  We reviewed your recent medical hx> Shingles (be sure to get the vaccine later), colonoscopy w/ polyp, thyroid eval, etc...  Call for any questions...  Let's plan a follow up visit in 6 months w/ FASTING blood work at that time.Marland KitchenMarland Kitchen

## 2012-03-31 NOTE — Progress Notes (Signed)
Subjective:    Patient ID: [name], female    DOB: 02-10-1941, 71 y.o.   MRN: [id number]  HPI 71 y/o WF here for a follow up visit... she has mult med problems as noted below>  ~  February 27, 2010:  BP remains well controlled on Metoprolol, Norvasc, Lasix... no cerebrovasc symptoms on Aggrenox & due for f/u CDopplers in May (stable mild carotid dis)... Chol also improved w/ her Simva20... she continues f/u w/ DrKerr for her Thyroid Ca...  ~  August 28, 2010:  she is sched for an I-131 total body scan tomorrow per DrKerr (Neg- no evid met dis)... recent thyroid blood tests were OK- thyroglob undetectable & TSH= 0.42 w/ FreeT4= 1.28 (0.61-1.12)... BP remains controlled on her 3 med regimen;  no CP, palpit, SOB, or cerebral ischemic symtpoms on her Aggrenox;  Lipids remain controlled w/ diet + Simva20;  otherw stable- no new complaints or concerns, refills written today, had Flu shot 9/11.  ~  February 20, 2011:  49mo ROV & she remains stable>  She saw DrKerr last week & Thyroid status is stable, TSH 0.36 on Synthroid100, Ultrasound shows no recurrent or resid thyroid tissue or adenopathy;  Also had CDoppler- stable, mild plaque in bulbs & 0-39% bilat ICA stenoses...  FLP looks good on Simva20; BP well controlled on meds; and no cerebral ischemic symptoms on the Aggrenox...  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 BP remains controlled on Metop, Amlodip, Furosem;  She is asymptomatic w/o CP, palpit, dizzy, SOB, edema, or cerebral ischemic symptoms on her Aggrenox Bid;  Chol looks good on Simva20- see prob list below>> she had flu vaccine in Sept...  ~  Mar 31, 2012:  49mo ROV & she reports sudden right hip pain yest- went to see DrHilts & he Dx Shingles & Rx w/ Valtrex & Vicodin; we  discussed Shingles vaccine once she's recovered; otherw doing well- BP controlled, Chol at goals, etc...    She saw DrPerry for GI 12/12 w/ colonoscopy done> 2 polyps in asc colon= tubular adenomas & f/u suggested in 85yrs...    She had f/u eval DrKerr 4/13 w/ hx papillary thyroid cancer> s/p thyroidectomy & RAI therapy; on Levothy116mcg/d; ThySonar 4/13> no resid thyroid tissue or mass; "Everything was fine, no sign of cancer coming back" she says.    She had BMD at [facility] 12/12 per Gyn> TScore in left FemNeck -1.9    She sees DrLupton for Derm> "I have keratosis" she says & treated w/ topical cortisone cream... We reviewed prob list, meds, xrays and labs> see below>>        Problem List:  ALLERGIC RHINITIS (ICD-477.9) - we discussed Rx w/ Zyrtek, Saline, Mucinex OTC...  HYPERTENSION (ICD-401.9) - on METOPROLOL XL 50mg /d, NORVASC 10mg /d, & LASIX 20mg - 1/2 tab daily...   ~  CXR 12/10 showed mild cardiomeg, clear lungs, NAD... ~  10/11:  BP=134/86, doing well w/ med + home BP checks... denies HA, fatigue, visual changes, CP, palipit, dizziness, syncope, dyspnea, edema, etc... ~  4/12:  BP= 120/76 & she continues to remain asymptomatic... ~  11/12:  BP= 122/72 & she continues stable w/o CP, palpit, dizzy, SOB, edema, or cerebral ischemic symptoms on her Aggrenox Bid. ~  5/13:  BP= 138/72 & she denies CP, palpit, SOB, edema, etc...  MITRAL VALVE PROLAPSE (ICD-424.0) - clinical diagnosis in the past... no recent symptoms of CP or palpit... can't find prev 2DEcho...  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 CDoppler's w/ non-obstructive plaque right carotid bulb, no signif ICA stenoses... ~  f/u CDoppler 5/10 showed mild plaque bilat, 0-39% blat ICA stenoses... f/u 47yr. ~  f/u CDoppler 5/11 showed mild plaque bilat, 0-39% bilat ICA stenoses, no change, f/u 58yr. ~  f/u CDoppler 4/12 showed mild plaque in bulbs,  0-39% bilat ICA stenoses, stable, repeat rec in 35yrs.  HYPERLIPIDEMIA (ICD-272.4) - on SIMVASTATIN 20mg /d... she tried off meds 3/09 due to leg pains- but further eval showed HNP...  ~  pre-treatment FLP 3/04 showed TChol 282, TG 210, HDL 56, LDL 184... statin Rx started... ~  f/u FLP's on Statin Rx were good:  TChol 150-166, TG 87-119, HDL 35-49, LDL 86-107... ~  FLP 10/08 on Zocor20/d showed TChol 174, TG 136, HDL 47, LDL 99... ~  FLP 4/09 off Zocor showed TChol 285, TG 192, HDL 56, LDL 200... rec- restart Zocor20. ~  FLP 11/09 on Simva20 showed TChol 189, Tg 142, HDL 46, LDL 115... rec> continue same. ~  FLP 4/10 on Simva20 showed TChol 193, TG 114, HDL 55, LDL 116... may need incr dose. 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 OTHER IATROGENIC HYPOTHYROIDISM (ICD-244.3) - on SYNTHROID 138mcg/d ~  right lower pole thyroid nodule noted Apr10> eval revealed a papillary carcinoma on the right, and Hashimoto's disease on the left- s/p total thyroidectomy 7/10 by DrCornett...  ~  Endocrine eval by DrKerr 9/10 w/ radioactive iodine therapy- 106 mCi I- 131 given and scan showed only uptake in neck... ~  labs 4/10 showed TSH = 1.30 ~  9/10:  started on thyroid replacement by DrKerr- currently Synthroid 112 micrograms/d. ~  12/10: Synthroid decreased to 167mcg/d due to ?elevated BP?... labs followed by DrKerr. ~  9/11:  f/u DrKerr doing well- labs on SYNTHROID 17mcg/d= OK, and I-131 whole body scan reported neg. ~  4/12:  She continues regular f/u DrKerr> TSH= 0.36, Thyroid Ultrasound w/o recurrent or resid thyroid tissue seen... ~  4/13:  F/u DrKerr 4/13 w/ hx papillary thyroid  cancer> s/p thyroidectomy & RAI therapy; on Levothy141mcg/d; ThySonar 4/13> no resid thyroid tissue or mass; "Everything was fine, no sign of cancer coming back" she says. ~  She continues regular Q45mo follow up visits w/ DrKerr...  COLONIC POLYPS (ICD-211.3) - colonoscopy 8/09 by DrPerry w/ several adenomatous polyps removed... f/u planned 5 yrs. ~  She saw DrPerry 12/12 w/ colonoscopy done> 2 polyps in asc colon= tubular adenomas & f/u suggested in 35yrs...  BACK PAIN, LUMBAR (ICD-724.2) - eval DrDuda 4/09 w/ HNP L5-S1 and treated w/ epid steroid shot by DrNewton and improved...  OSTEOPENIA (ICD-733.90) - prev on Fosamax but this was stopped by GYN after last BMD (2008) at [facility] showed improved BMD... she takes Caltrate, Vits, Vit D... ~  labs 4/09 showed Vit  D level = 33 therefore started Vit D OTC 11-1998 daly. ~  labs 4/10 showed Vit D level = 59 on 1000 u daily. ~  pt reports that f/u BMD 9/10 was "excellent"... ~  labs 4/11 showed Vit d level = 39... rec> continue 1000 u daily. 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 stable on AGGRENOX Bid... ~  4/12:  She continues w/o cerebral ischemic symptoms...  Skin Cancer - BCE removed from left ankle by DrNolan 8/10... Another removed from nose & followed by Moh's surg... ~  5/13:  She reports sudden right hip pain w/ eval DrHilts- Shingles Rx w/ Valtrex & Vicodin by him...  Health Maintenance:   ~  last colonoscopy 8/09 by DrPerry> see above. ~  GYN= DrRichardson... pt states she is seen every other yr & Mammogram/ BMD at [facility]. ~  Immunizations:  Tetanus/ TDAP updated 2010;  Pneumovax in 2006 at age 52;  she gets the Flu shots yearly...   Past Surgical History  Procedure Date  . Dilation and curettage of uterus   . Total thyroidectomy   . Polypectomy   . Colonoscopy       Outpatient Encounter Prescriptions as of 03/31/2012  Medication Sig Dispense Refill  . amLODipine (NORVASC) 10 MG tablet Take 1 tablet (10 mg total) by mouth daily.  30 tablet  11  . Calcium Carbonate-Vitamin D (CALCIUM 600+D) 600-400 MG-UNIT per tablet Take 2 tablets by mouth daily.        . cetirizine (ZYRTEC) 5 MG tablet Take 5 mg by mouth as needed.        . Cholecalciferol (VITAMIN D) 2000 UNITS CAPS Take 1 capsule by mouth daily.        [name] dextromethorphan-guaiFENesin (MUCINEX DM) 30-600 MG per 12 hr tablet Take 1 tablet by mouth as needed.       . dipyridamole-aspirin (AGGRENOX) 25-200 MG per 12 hr capsule Take 1 capsule by mouth 2 (two) times daily.  60 capsule  11  . furosemide (LASIX) 20 MG tablet Take 1/2 daily as needed for swelling  30 tablet  11  . levothyroxine (SYNTHROID, LEVOTHROID) 100 MCG tablet Take 100 mcg by mouth daily.        . metoprolol (TOPROL-XL) 50 MG 24 hr tablet Take 1 tablet (50 mg total) by mouth daily.  30 tablet  11  . Multiple Vitamins-Minerals (CENTRUM SILVER PO) Take 1 tablet by mouth daily.        . simvastatin (ZOCOR) 20 MG tablet Take 1 tablet (20 mg total) by mouth at bedtime.  30 tablet  11   Facility-Administered Encounter Medications as of 03/31/2012  Medication Dose Route Frequency Provider Last Rate Last Dose  . 0.9 %  sodium chloride infusion  500 mL Intravenous Continuous Hilarie Fredrickson, MD        Allergies  Allergen Reactions  . Sulfonamide Derivatives     REACTION: rash    Current Medications, Allergies, Past Medical History, Past Surgical History, Family History, and Social History were reviewed in [employer] record.    Review of Systems        See HPI - all other systems neg except as noted... The patient denies anorexia, fever, weight loss, weight gain, vision loss, decreased hearing, hoarseness, chest pain, syncope, dyspnea on  exertion, peripheral edema, prolonged cough, headaches, hemoptysis, abdominal  pain, melena, hematochezia, severe indigestion/heartburn, hematuria, incontinence, muscle weakness, suspicious skin lesions, transient blindness, difficulty walking, depression, unusual weight change, abnormal bleeding, enlarged lymph nodes, and angioedema.     Objective:   Physical Exam     WD, WN, 71 y/o WF in NAD... GENERAL:  Alert & oriented; pleasant & cooperative... HEENT:  [place]/AT, EOM-wnl, PERRLA, EACs-clear, TMs-wnl, NOSE-clear, THROAT-clear & wnl. NECK:  Supple w/ fairROM; no JVD; normal carotid impulses w/o bruits; scar of prev thyroid surg, no nodules, no adenopathy. CHEST:  Clear to P & A; without wheezes/ rales/ or rhonchi. HEART:  Regular Rhythm; without murmurs/ rubs/ or gallops. ABDOMEN:  Soft & nontender; normal bowel sounds; no organomegaly or masses detected. EXT: without deformities or arthritic changes; no varicose veins/ venous insuffic/ or edema. NEURO:  CN's intact; motor testing normal; sensory testing normal; gait normal & balance OK. DERM:  No lesions noted; no rash etc...   Assessment & Plan:   HBP>  Controlled on meds, tol well, asymptomatic, continue same...  Cerebrovasc Dis & hx TIA>  She remains asymptomatic w/o cerebral ischemic symptoms, continue Agrennox...  HYPERLIPID>  Stable on diet + Simva20...  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  GI> Hx polyps>  She is up to date and had f/u colonoscopy 12/12 as above...  Osteopenia>  Managed by GYN DrRichardson & pt reports that her last BMD was "excellent"...  Skin Cancer>  She reports Moh's surg for BCE on nose, plus several "keratoses"...  Other medical issues as noted...   Patient's Medications  New Prescriptions   No medications on file  Previous Medications   CALCIUM CARBONATE-VITAMIN D (CALCIUM 600+D) 600-400 MG-UNIT PER TABLET    Take 2 tablets by mouth daily.     CETIRIZINE (ZYRTEC) 5 MG TABLET    Take 5 mg by mouth as  needed.     CHOLECALCIFEROL (VITAMIN D) 2000 UNITS CAPS    Take 1 capsule by mouth daily.     DEXTROMETHORPHAN-GUAIFENESIN (MUCINEX DM) 30-600 MG PER 12 HR TABLET    Take 1 tablet by mouth as needed.    DIPYRIDAMOLE-ASPIRIN (AGGRENOX) 25-200 MG PER 12 HR CAPSULE    Take 1 capsule by mouth 2 (two) times daily.   FUROSEMIDE (LASIX) 20 MG TABLET    Take 1/2 daily as needed for swelling   HYDROCODONE-ACETAMINOPHEN (NORCO) 5-325 MG PER TABLET    Take 1 tablet by mouth daily.    LEVOTHYROXINE (SYNTHROID, LEVOTHROID) 100 MCG TABLET    Take 100 mcg by mouth daily.     METOPROLOL (TOPROL-XL) 50 MG 24 HR TABLET    Take 1 tablet (50 mg total) by mouth daily.   MULTIPLE VITAMINS-MINERALS (CENTRUM SILVER PO)    Take 1 tablet by mouth daily.     SIMVASTATIN (ZOCOR) 20 MG TABLET    Take 1 tablet (20 mg total) by mouth at bedtime.   VALACYCLOVIR (VALTREX) 1000 MG TABLET    Take 1,000 mg by mouth daily.   Modified Medications   Modified Medication Previous Medication   AMLODIPINE (NORVASC) 10 MG TABLET amLODipine (NORVASC) 10 MG tablet      Take 1 tablet (10 mg total) by mouth daily.    Take 1 tablet (10 mg total) by mouth daily.  Discontinued Medications   No medications on file

## 2012-04-04 DIAGNOSIS — B029 Zoster without complications: Secondary | ICD-10-CM | POA: Diagnosis not present

## 2012-06-06 DIAGNOSIS — H2510 Age-related nuclear cataract, unspecified eye: Secondary | ICD-10-CM | POA: Diagnosis not present

## 2012-06-06 DIAGNOSIS — H11049 Peripheral pterygium, stationary, unspecified eye: Secondary | ICD-10-CM | POA: Diagnosis not present

## 2012-07-13 ENCOUNTER — Ambulatory Visit (INDEPENDENT_AMBULATORY_CARE_PROVIDER_SITE_OTHER): Payer: Medicare Other

## 2012-07-13 DIAGNOSIS — Z23 Encounter for immunization: Secondary | ICD-10-CM

## 2012-07-14 DIAGNOSIS — Z23 Encounter for immunization: Secondary | ICD-10-CM

## 2012-09-06 ENCOUNTER — Other Ambulatory Visit: Payer: Self-pay | Admitting: Surgery

## 2012-09-06 DIAGNOSIS — D235 Other benign neoplasm of skin of trunk: Secondary | ICD-10-CM | POA: Diagnosis not present

## 2012-09-06 DIAGNOSIS — C44211 Basal cell carcinoma of skin of unspecified ear and external auricular canal: Secondary | ICD-10-CM | POA: Diagnosis not present

## 2012-09-06 DIAGNOSIS — Z85828 Personal history of other malignant neoplasm of skin: Secondary | ICD-10-CM | POA: Diagnosis not present

## 2012-09-06 DIAGNOSIS — D485 Neoplasm of uncertain behavior of skin: Secondary | ICD-10-CM | POA: Diagnosis not present

## 2012-09-06 DIAGNOSIS — L570 Actinic keratosis: Secondary | ICD-10-CM | POA: Diagnosis not present

## 2012-09-12 DIAGNOSIS — C73 Malignant neoplasm of thyroid gland: Secondary | ICD-10-CM | POA: Diagnosis not present

## 2012-09-14 DIAGNOSIS — C73 Malignant neoplasm of thyroid gland: Secondary | ICD-10-CM | POA: Diagnosis not present

## 2012-09-14 DIAGNOSIS — E890 Postprocedural hypothyroidism: Secondary | ICD-10-CM | POA: Diagnosis not present

## 2012-09-27 ENCOUNTER — Telehealth: Payer: Self-pay | Admitting: Pulmonary Disease

## 2012-09-27 MED ORDER — AMOXICILLIN-POT CLAVULANATE 875-125 MG PO TABS: 1.0000 | ORAL_TABLET | Freq: Two times a day (BID) | ORAL | Status: DC

## 2012-09-27 NOTE — Telephone Encounter (Signed)
Per SN---cont the mucinex 2 po bid with plenty of fluids, call in augmentin 875 mg  #14  1 po bid and take align once daily.  thanks

## 2012-09-27 NOTE — Telephone Encounter (Signed)
I spoke with pt and is aware of SN recs. I have sent rx into the pharmacy. Nothing further was needed

## 2012-09-27 NOTE — Telephone Encounter (Signed)
Called and spoke with patient, patient c/o sore throat, drainage in throat, prod cough w yellow/red mucus, and "stopped up" ears. Patient states she was taking chloraseptic spray,Mucinex and Zyrtec in which provided her slight relief but then got worse.  Patient has been having symptoms x1 week, denies any f/c/s.  Requesting Rx to be called into [facility]  Dr. Kriste Basque please advise, thank you!  Allergies  Allergen Reactions  . Sulfonamide Derivatives     REACTION: rash

## 2012-09-30 ENCOUNTER — Ambulatory Visit: Payer: Medicare Other | Admitting: Pulmonary Disease

## 2012-10-16 ENCOUNTER — Other Ambulatory Visit: Payer: Self-pay | Admitting: Pulmonary Disease

## 2012-10-23 ENCOUNTER — Other Ambulatory Visit: Payer: Self-pay | Admitting: Pulmonary Disease

## 2012-11-03 ENCOUNTER — Encounter: Payer: Self-pay | Admitting: Pulmonary Disease

## 2012-11-03 ENCOUNTER — Ambulatory Visit (INDEPENDENT_AMBULATORY_CARE_PROVIDER_SITE_OTHER)
Admission: RE | Admit: 2012-11-03 | Discharge: 2012-11-03 | Disposition: A | Discharge status: Home / Self Care | Payer: Medicare Other | Source: Ambulatory Visit | Attending: Pulmonary Disease | Admitting: Pulmonary Disease

## 2012-11-03 ENCOUNTER — Other Ambulatory Visit (INDEPENDENT_AMBULATORY_CARE_PROVIDER_SITE_OTHER): Payer: Medicare Other

## 2012-11-03 ENCOUNTER — Ambulatory Visit (INDEPENDENT_AMBULATORY_CARE_PROVIDER_SITE_OTHER): Payer: Medicare Other | Admitting: Pulmonary Disease

## 2012-11-03 VITALS — BP 138/76 | HR 70 | Temp 97.6°F | Ht 62.0 in | Wt 128.0 lb

## 2012-11-03 DIAGNOSIS — I679 Cerebrovascular disease, unspecified: Secondary | ICD-10-CM | POA: Diagnosis not present

## 2012-11-03 DIAGNOSIS — M899 Disorder of bone, unspecified: Secondary | ICD-10-CM | POA: Diagnosis not present

## 2012-11-03 DIAGNOSIS — I10 Essential (primary) hypertension: Secondary | ICD-10-CM

## 2012-11-03 DIAGNOSIS — C73 Malignant neoplasm of thyroid gland: Secondary | ICD-10-CM

## 2012-11-03 DIAGNOSIS — E785 Hyperlipidemia, unspecified: Secondary | ICD-10-CM | POA: Diagnosis not present

## 2012-11-03 DIAGNOSIS — E032 Hypothyroidism due to medicaments and other exogenous substances: Secondary | ICD-10-CM

## 2012-11-03 DIAGNOSIS — D126 Benign neoplasm of colon, unspecified: Secondary | ICD-10-CM

## 2012-11-03 DIAGNOSIS — M545 Low back pain: Secondary | ICD-10-CM

## 2012-11-03 DIAGNOSIS — C4490 Unspecified malignant neoplasm of skin, unspecified: Secondary | ICD-10-CM

## 2012-11-03 DIAGNOSIS — Z85850 Personal history of malignant neoplasm of thyroid: Secondary | ICD-10-CM | POA: Diagnosis not present

## 2012-11-03 DIAGNOSIS — M949 Disorder of cartilage, unspecified: Secondary | ICD-10-CM

## 2012-11-03 LAB — CBC WITH DIFFERENTIAL/PLATELET
Basophils Relative: 0.6 % (ref 0.0–3.0)
Eosinophils Relative: 2.6 % (ref 0.0–5.0)
HCT: 42 % (ref 36.0–46.0)
Hemoglobin: 14.2 g/dL (ref 12.0–15.0)
Lymphs Abs: 1.5 10*3/uL (ref 0.7–4.0)
MCV: 87.3 fl (ref 78.0–100.0)
Monocytes Absolute: 0.5 10*3/uL (ref 0.1–1.0)
Neutro Abs: 4.3 10*3/uL (ref 1.4–7.7)
RBC: 4.82 Mil/uL (ref 3.87–5.11)
WBC: 6.6 10*3/uL (ref 4.5–10.5)

## 2012-11-03 LAB — LIPID PANEL
Cholesterol: 170 mg/dL (ref 0–200)
HDL: 52.4 mg/dL (ref >39.00)
LDL Cholesterol: 100 mg/dL — ABNORMAL HIGH (ref 0–99)
VLDL: 17.8 mg/dL (ref 0.0–40.0)

## 2012-11-03 LAB — BASIC METABOLIC PANEL
BUN: 17 mg/dL (ref 6–23)
Calcium: 9.4 mg/dL (ref 8.4–10.5)
Creatinine, Ser: 0.6 mg/dL (ref 0.4–1.2)
GFR: 104.22 mL/min (ref >60.00)
Potassium: 4.3 mEq/L (ref 3.5–5.1)

## 2012-11-03 LAB — HEPATIC FUNCTION PANEL
AST: 23 U/L (ref 0–37)
Total Bilirubin: 0.7 mg/dL (ref 0.3–1.2)

## 2012-11-03 MED ORDER — METOPROLOL SUCCINATE ER 50 MG PO TB24: 50.0000 mg | ORAL_TABLET | Freq: Every day | ORAL | Status: DC

## 2012-11-03 MED ORDER — FUROSEMIDE 20 MG PO TABS: ORAL_TABLET | ORAL | Status: DC

## 2012-11-03 MED ORDER — SIMVASTATIN 20 MG PO TABS: 20.0000 mg | ORAL_TABLET | Freq: Every day | ORAL | Status: DC

## 2012-11-03 MED ORDER — AMLODIPINE BESYLATE 10 MG PO TABS: 10.0000 mg | ORAL_TABLET | Freq: Every day | ORAL | Status: DC

## 2012-11-03 NOTE — Progress Notes (Signed)
Subjective:    Patient ID: [name], female    DOB: 1941/06/28, 72 y.o.   MRN: [id number]  HPI 72 y/o WF here for a follow up visit... she has mult med problems as noted below>  ~  February 27, 2010:  BP remains well controlled on Metoprolol, Norvasc, Lasix... no cerebrovasc symptoms on Aggrenox & due for f/u CDopplers in May (stable mild carotid dis)... Chol also improved w/ her Simva20... she continues f/u w/ DrKerr for her Thyroid Ca...  ~  August 28, 2010:  she is sched for an I-131 total body scan tomorrow per DrKerr (Neg- no evid met dis)... recent thyroid blood tests were OK- thyroglob undetectable & TSH= 0.42 w/ FreeT4= 1.28 (0.61-1.12)... BP remains controlled on her 3 med regimen;  no CP, palpit, SOB, or cerebral ischemic symtpoms on her Aggrenox;  Lipids remain controlled w/ diet + Simva20;  otherw stable- no new complaints or concerns, refills written today, had Flu shot 9/11.  ~  February 20, 2011:  1mo ROV & she remains stable>  She saw DrKerr last week & Thyroid status is stable, TSH 0.36 on Synthroid100, Ultrasound shows no recurrent or resid thyroid tissue or adenopathy;  Also had CDoppler- stable, mild plaque in bulbs & 0-39% bilat ICA stenoses...  FLP looks good on Simva20; BP well controlled on meds; and no cerebral ischemic symptoms on the Aggrenox...  ~  October 02, 2011:   73mo ROV & [name] reports stable> she had basal cell cancer removed from her nose & is sched for Moh's soon;  She had f/u visit w/ DrKerr 10/12 for the Thyroid Cancer- on suppressive Levothyroxine 1105mcg/d & labs done by DrKerr reviewed... BP remains controlled on Metop, Amlodip, Furosem;  She is asymptomatic w/o CP, palpit, dizzy, SOB, edema, or cerebral ischemic symptoms on her Aggrenox Bid;  Chol looks good on Simva20- see prob list below>> she had flu vaccine in Sept...  ~  Mar 31, 2012:  1mo ROV & she reports sudden right hip pain yest- went to see DrHilts & he Dx Shingles & Rx w/ Valtrex & Vicodin; we  discussed Shingles vaccine once she's recovered; otherw doing well- BP controlled, Chol at goals, etc...    She saw DrPerry for GI 12/12 w/ colonoscopy done> 2 polyps in asc colon= tubular adenomas & f/u suggested in 3yrs...    She had f/u eval DrKerr 4/13 w/ hx papillary thyroid cancer> s/p thyroidectomy & RAI therapy; on Levothy132mcg/d; ThySonar 4/13> no resid thyroid tissue or mass; "Everything was fine, no sign of cancer coming back" she says.    She had BMD at [facility] 12/12 per Gyn> TScore in left FemNeck -1.9    She sees DrLupton for Derm> "I have keratosis" she says & treated w/ topical cortisone cream... 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  We reviewed the following medical problems during today's office visit>>     AR> on Zyrtek, Mucinex; stable on prn med rx...    HBP, MVP> on Metop50, Amlod10, Lasix20-1/2daily; BP= 138/76 & she denies CP, palpit, dizzy, SOB, edema, etc...    Cerebrovasc dis & TIA> on Aggrenox25-200Bid; she remains asymptomatic w/o cerebral ischemic symptoms...    CHOL> on Simva20;  FLP shows TChol 170, TG 89, HDL 52, LDL 100; continue same Rx & diet efforts...    Thyroid cancer & hypothy> on Synthroid100; followed by DrKerr Q41mo- seen 11/13 & doing well...    GI- colon polyps> followed by DrPerry w/ last colon 12/12 showing 2 sm adenomatous polyps removed & f/u planned 40yrs...    LBP> off Vicodin & using OTC pain meds prn; she had eval DrDuda & ESI from [facility] in 2009; doing well w/o signif recurrent pain...    Osteopenia> on calcium, MVI, VitD; prev on Fosamax but stopped by GYN ?2008 & they follow her BMDs...    Skin Cancer> new- in left ext ear canal w/ surg planned by Derm 1/14...    Shingles> she had the  shingles vaccine prev; developed rt leg rash/pain & eval by Ortho- DrHilts 12/13- treated w/ Valtrex, Pred, Vicodin & resolved... We reviewed prob list, meds, xrays and labs> see below for updates >> she had the flu vaccine 9/13 & requests refill prescriptions today... CXR 1/14 showed norm heart size, clear lungs-wnl, clips in lower neck from thyroid surg... LABS 1/14: FLP- at goals on Simva20;  Chems- wnl;  CBC- wnl;  VitD= 69;  Thyroid per DrKerr...         Problem List:  ALLERGIC RHINITIS (ICD-477.9) - we discussed Rx w/ Zyrtek, Saline, Mucinex OTC...  HYPERTENSION (ICD-401.9) - on METOPROLOL XL 50mg /d, NORVASC 10mg /d, & LASIX 20mg - 1/2 tab daily...   ~  CXR 12/10 showed mild cardiomeg, clear lungs, NAD... ~  10/11:  BP=134/86, doing well w/ med + home BP checks... denies HA, fatigue, visual changes, CP, palipit, dizziness, syncope, dyspnea, edema, etc... ~  4/12:  BP= 120/76 & she continues to remain asymptomatic... ~  11/12:  BP= 122/72 & she continues stable w/o CP, palpit, dizzy, SOB, edema, or cerebral ischemic symptoms on her Aggrenox Bid. ~  5/13:  BP= 138/72 & she denies CP, palpit, SOB, edema, etc... ~  1/14: on Metop50, Amlod10, Lasix20-1/2daily; BP= 138/76 & she denies CP, palpit, dizzy, SOB, edema, etc...  MITRAL VALVE PROLAPSE (ICD-424.0) - clinical diagnosis in the past... no recent symptoms of CP or palpit... can't find prev 2DEcho...  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 CDoppler's w/ non-obstructive plaque right carotid bulb, no signif ICA stenoses... ~  f/u CDoppler 5/10 showed mild plaque bilat, 0-39% blat ICA stenoses... f/u 34yr. ~  f/u CDoppler 5/11 showed mild plaque bilat, 0-39% bilat ICA stenoses, no change, f/u 41yr. ~  f/u CDoppler 4/12 showed mild plaque in bulbs, 0-39% bilat ICA stenoses, stable, repeat rec in 27yrs.  HYPERLIPIDEMIA (ICD-272.4) - on SIMVASTATIN 20mg /d...  she tried off meds 3/09 due to leg pains- but further eval showed HNP...  ~  pre-treatment FLP 3/04 showed TChol 282, TG 210, HDL 56, LDL 184... statin Rx started... ~  f/u FLP's on Statin Rx were good:  TChol 150-166, TG 87-119, HDL 35-49, LDL 86-107... ~  FLP 10/08 on Zocor20/d showed TChol 174, TG 136, HDL 47, LDL 99... ~  FLP 4/09 off Zocor showed TChol 285, TG 192, HDL 56, LDL 200... rec- restart Zocor20. ~  FLP 11/09 on Simva20 showed TChol 189, Tg 142, HDL 46, LDL 115... rec> continue same. ~  FLP 4/10 on Simva20 showed TChol 193, TG 114, HDL 55, LDL 116... may need incr dose. 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  ~  Endocrine eval by DrKerr 9/10 w/ radioactive iodine therapy- 106 mCi I- 131 given and scan showed only uptake in neck... ~  labs 4/10 showed TSH = 1.30 ~  9/10:  started on thyroid replacement by DrKerr- currently Synthroid 112 micrograms/d. ~  12/10: Synthroid decreased to 142mcg/d due to ?elevated BP?... labs followed by DrKerr. ~  9/11:  f/u DrKerr doing well- labs on SYNTHROID 148mcg/d= OK, and I-131 whole body scan reported neg. ~  4/12:  She continues regular f/u DrKerr> TSH= 0.36, Thyroid Ultrasound w/o recurrent or resid thyroid tissue seen... ~  4/13:  F/u DrKerr 4/13 w/ hx papillary thyroid cancer> s/p thyroidectomy & RAI therapy; on  Levothy14mcg/d; ThySonar 4/13> no resid thyroid tissue or mass; "Everything was fine, no sign of cancer coming back" she says. ~  11/13: she saw DrKerr for 37mo ROV> papillary thyr ca, follicular variant- post surg hypothyroidism on Synthroid100; TSH=0.52; Throglob level remains undetectable... ~  She continues regular Q40mo follow up visits w/ DrKerr...  COLONIC POLYPS (ICD-211.3) - colonoscopy 8/09 by DrPerry w/ several adenomatous polyps removed... f/u planned 5 yrs. ~  She saw DrPerry 12/12 w/ colonoscopy done> 2 polyps in asc colon= tubular adenomas & f/u suggested in 23yrs...  BACK PAIN, LUMBAR (ICD-724.2) - eval DrDuda 4/09 w/ HNP L5-S1 and treated w/ epid steroid shot by DrNewton and improved...  OSTEOPENIA (ICD-733.90) - prev on Fosamax but this was stopped by GYN after last BMD (2008) at [facility] showed improved BMD... she takes Caltrate, Vits, Vit D... ~  labs 4/09 showed Vit D level = 33 therefore started Vit D OTC 11-1998 daly. ~  labs 4/10 showed Vit D level = 59 on 1000 u daily. ~  pt reports that f/u BMD 9/10 was "excellent"... ~  labs 4/11 showed Vit d level = 39... rec> continue 1000 u daily. ~  She had BMD at 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 stable on AGGRENOX Bid... ~  4/12:  She continues w/o cerebral ischemic symptoms...  Skin Cancer - BCE removed from left ankle by DrNolan 8/10... Another removed from nose & followed by Moh's surg... ~  5/13:  She reports sudden right hip pain w/ eval DrHilts- Shingles Rx w/ Valtrex & Vicodin by him...  Hx Shingles >> she's had the Shingles vaccine... ~  12/13: she had an outbreak on right leg w/ pain; went to her Ortho- DrHilts & treated w/ Valtrex, Pred, Vicodin and resolved...  Health Maintenance:   ~  last colonoscopy 8/09 by DrPerry> see above. ~  GYN= DrRichardson... pt states  she is seen every other yr & Mammogram/ BMD at [facility]. ~  Immunizations:  Tetanus/ TDAP updated 2010;  Pneumovax in 2006 at age 21;  she gets the Flu shots yearly...   Past Surgical History  Procedure Date  . Dilation and curettage of uterus   . Total thyroidectomy   . Polypectomy   . Colonoscopy     Outpatient Encounter Prescriptions as of 11/03/2012  Medication Sig Dispense Refill  . AGGRENOX 25-200 MG per 12 hr capsule TAKE ONE CAPSULE BY MOUTH TWICE A DAY  60 capsule  7  . amLODipine (NORVASC) 10 MG tablet Take 1 tablet (10 mg total) by mouth daily.  30 tablet  11  . Calcium Carbonate-Vitamin D (CALCIUM 600+D) 600-400 MG-UNIT per tablet Take 1 tablet by mouth daily.       . cetirizine (ZYRTEC) 5 MG tablet Take 5 mg by mouth as needed.        . Cholecalciferol (VITAMIN D) 2000 UNITS CAPS Take 1 capsule by mouth daily.        [name] dextromethorphan-guaiFENesin (MUCINEX DM) 30-600 MG per 12 hr tablet Take 1 tablet by mouth as needed.       . furosemide (LASIX) 20 MG tablet Take 1/2 daily as needed for swelling  30 tablet  11  . levothyroxine (SYNTHROID, LEVOTHROID) 100 MCG tablet Take 100 mcg by mouth daily.        . metoprolol succinate (TOPROL-XL) 50 MG 24 hr tablet TAKE 1 TABLET BY MOUTH EVERY DAY  30 tablet  2  . Multiple Vitamins-Minerals (CENTRUM SILVER PO) Take 1 tablet by mouth daily.        . simvastatin (ZOCOR) 20 MG tablet TAKE 1 TABLET BY MOUTH AT BEDTIME  30 tablet  2  . [DISCONTINUED] amoxicillin-clavulanate (AUGMENTIN) 875-125 MG per tablet Take 1 tablet by mouth 2 (two) times daily.  14 tablet  0  . [DISCONTINUED] HYDROcodone-acetaminophen (NORCO) 5-325 MG per tablet Take 1 tablet by mouth daily.       . [DISCONTINUED] valACYclovir (VALTREX) 1000 MG tablet Take 1,000 mg by mouth daily.        Facility-Administered Encounter Medications as of 11/03/2012  Medication Dose Route Frequency Provider Last Rate Last Dose  . 0.9 %  sodium chloride infusion  500 mL Intravenous  Continuous Hilarie Fredrickson, MD        Allergies  Allergen Reactions  . Sulfonamide Derivatives     REACTION: rash    Current Medications, Allergies, Past Medical History, Past Surgical History, Family History, and Social History were reviewed in [employer] record.    Review of Systems        See HPI - all other systems neg except as noted... The patient denies anorexia, fever, weight loss, weight gain, vision loss, decreased hearing, hoarseness, chest pain, syncope, dyspnea on exertion, peripheral edema, prolonged cough, headaches, hemoptysis, abdominal pain, melena, hematochezia, severe indigestion/heartburn, hematuria, incontinence, muscle weakness, suspicious skin lesions, transient blindness, difficulty walking, depression, unusual weight change, abnormal bleeding, enlarged lymph nodes, and angioedema.     Objective:   Physical Exam     WD, WN, 72 y/o WF in NAD... GENERAL:  Alert & oriented; pleasant & cooperative... HEENT:  [place]/AT, EOM-wnl, PERRLA, pterygium left eye, EACs-clear, TMs-wnl, NOSE-clear, THROAT-clear & wnl. NECK:  Supple w/ fairROM; no JVD; normal carotid impulses w/o bruits; scar of prev thyroid surg, no nodules, no adenopathy. CHEST:  Clear to P & A; without wheezes/ rales/ or rhonchi. HEART:  Regular Rhythm; without murmurs/ rubs/ or gallops. ABDOMEN:  Soft & nontender; normal bowel sounds; no organomegaly or masses  detected. EXT: without deformities or arthritic changes; no varicose veins/ venous insuffic/ or edema. NEURO:  CN's intact; motor testing normal; sensory testing normal; gait normal & balance OK. DERM:  Small raised lesion inside left pinna/ ear canal...  RADIOLOGY DATA:  Reviewed in the EPIC EMR & discussed w/ the patient...  LABORATORY DATA:  Reviewed in the EPIC EMR & discussed w/ the patient...   Assessment & Plan:    HBP>  Controlled on meds, tol well, asymptomatic, continue same...  Cerebrovasc Dis & hx TIA>  She  remains asymptomatic w/o cerebral ischemic symptoms, continue Agrennox...  HYPERLIPID>  Stable on diet + Simva20...  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  GI> Hx polyps>  She is up to date and had f/u colonoscopy 12/12 as above...  Osteopenia>  Managed by GYN DrRichardson & pt reports that her last BMD was "excellent"...  Skin Cancer>  She reports Moh's surg for BCE on nose, plus several "keratoses"; new lesion inside left pinna w/ surg pending...  Other medical issues as noted...   Patient's Medications  New Prescriptions   No medications on file  Previous Medications   AGGRENOX 25-200 MG PER 12 HR CAPSULE    TAKE ONE CAPSULE BY MOUTH TWICE A DAY   CALCIUM CARBONATE-VITAMIN D (CALCIUM 600+D) 600-400 MG-UNIT PER TABLET    Take 1 tablet by mouth daily.    CETIRIZINE (ZYRTEC) 5 MG TABLET    Take 5 mg by mouth as needed.     CHOLECALCIFEROL (VITAMIN D) 2000 UNITS CAPS    Take 1 capsule by mouth daily.     DEXTROMETHORPHAN-GUAIFENESIN (MUCINEX DM) 30-600 MG PER 12 HR TABLET    Take 1 tablet by mouth as needed.    LEVOTHYROXINE (SYNTHROID, LEVOTHROID) 100 MCG TABLET    Take 100 mcg by mouth daily.     MULTIPLE VITAMINS-MINERALS (CENTRUM SILVER PO)    Take 1 tablet by mouth daily.    Modified Medications   Modified Medication Previous Medication   AMLODIPINE (NORVASC) 10 MG TABLET amLODipine (NORVASC) 10 MG tablet      Take 1 tablet (10 mg total) by mouth daily.    Take 1 tablet (10 mg total) by mouth daily.   FUROSEMIDE (LASIX) 20 MG TABLET furosemide (LASIX) 20 MG tablet      Take 1/2 daily as needed for swelling    Take 1/2 daily as needed for swelling   METOPROLOL SUCCINATE (TOPROL-XL) 50 MG 24 HR TABLET metoprolol succinate (TOPROL-XL) 50 MG 24 hr tablet      Take 1 tablet (50 mg total) by mouth daily. Take with or immediately following a meal.    TAKE 1 TABLET BY MOUTH EVERY DAY   SIMVASTATIN (ZOCOR) 20 MG  TABLET simvastatin (ZOCOR) 20 MG tablet      Take 1 tablet (20 mg total) by mouth at bedtime.    TAKE 1 TABLET BY MOUTH AT BEDTIME  Discontinued Medications   AMOXICILLIN-CLAVULANATE (AUGMENTIN) 875-125 MG PER TABLET    Take 1 tablet by mouth 2 (two) times daily.   HYDROCODONE-ACETAMINOPHEN (NORCO) 5-325 MG PER TABLET    Take 1 tablet by mouth daily.    VALACYCLOVIR (VALTREX) 1000 MG TABLET    Take 1,000 mg by mouth daily.

## 2012-11-03 NOTE — Patient Instructions (Addendum)
Today we updated your med list in our EPIC system...    Continue your current medications the same...    We refilled the meds you requested...  Today we did your follow up CXR 7 FASTING blood work...    We will contact you w/ the results when avail...  Call for any questions...  Let's continue our 6 month follow up visit.Marland KitchenMarland Kitchen

## 2012-11-04 DIAGNOSIS — C44211 Basal cell carcinoma of skin of unspecified ear and external auricular canal: Secondary | ICD-10-CM | POA: Diagnosis not present

## 2012-11-04 HISTORY — PX: OTHER SURGICAL HISTORY: SHX169

## 2012-11-16 DIAGNOSIS — Z1231 Encounter for screening mammogram for malignant neoplasm of breast: Secondary | ICD-10-CM | POA: Diagnosis not present

## 2012-12-05 ENCOUNTER — Other Ambulatory Visit: Payer: Self-pay | Admitting: Surgery

## 2012-12-05 DIAGNOSIS — C44319 Basal cell carcinoma of skin of other parts of face: Secondary | ICD-10-CM | POA: Diagnosis not present

## 2013-02-23 ENCOUNTER — Encounter (INDEPENDENT_AMBULATORY_CARE_PROVIDER_SITE_OTHER): Payer: Medicare Other

## 2013-02-23 DIAGNOSIS — I6529 Occlusion and stenosis of unspecified carotid artery: Secondary | ICD-10-CM

## 2013-03-13 DIAGNOSIS — E890 Postprocedural hypothyroidism: Secondary | ICD-10-CM | POA: Diagnosis not present

## 2013-03-13 DIAGNOSIS — C73 Malignant neoplasm of thyroid gland: Secondary | ICD-10-CM | POA: Diagnosis not present

## 2013-04-03 ENCOUNTER — Other Ambulatory Visit: Payer: Self-pay | Admitting: Internal Medicine

## 2013-04-03 DIAGNOSIS — C73 Malignant neoplasm of thyroid gland: Secondary | ICD-10-CM | POA: Diagnosis not present

## 2013-04-03 DIAGNOSIS — E890 Postprocedural hypothyroidism: Secondary | ICD-10-CM | POA: Diagnosis not present

## 2013-04-05 ENCOUNTER — Ambulatory Visit
Admission: RE | Admit: 2013-04-05 | Discharge: 2013-04-05 | Disposition: A | Discharge status: Home / Self Care | Payer: Medicare Other | Source: Ambulatory Visit | Attending: Internal Medicine | Admitting: Internal Medicine

## 2013-04-05 DIAGNOSIS — C73 Malignant neoplasm of thyroid gland: Secondary | ICD-10-CM

## 2013-04-05 DIAGNOSIS — E079 Disorder of thyroid, unspecified: Secondary | ICD-10-CM | POA: Diagnosis not present

## 2013-04-24 ENCOUNTER — Other Ambulatory Visit: Payer: Self-pay | Admitting: Surgery

## 2013-04-24 DIAGNOSIS — L821 Other seborrheic keratosis: Secondary | ICD-10-CM | POA: Diagnosis not present

## 2013-04-24 DIAGNOSIS — C44319 Basal cell carcinoma of skin of other parts of face: Secondary | ICD-10-CM | POA: Diagnosis not present

## 2013-04-24 DIAGNOSIS — D235 Other benign neoplasm of skin of trunk: Secondary | ICD-10-CM | POA: Diagnosis not present

## 2013-04-24 DIAGNOSIS — L819 Disorder of pigmentation, unspecified: Secondary | ICD-10-CM | POA: Diagnosis not present

## 2013-04-24 DIAGNOSIS — Z85828 Personal history of other malignant neoplasm of skin: Secondary | ICD-10-CM | POA: Diagnosis not present

## 2013-05-11 ENCOUNTER — Ambulatory Visit (INDEPENDENT_AMBULATORY_CARE_PROVIDER_SITE_OTHER): Payer: Medicare Other | Admitting: Pulmonary Disease

## 2013-05-11 ENCOUNTER — Encounter: Payer: Self-pay | Admitting: Pulmonary Disease

## 2013-05-11 VITALS — BP 130/70 | HR 64 | Temp 97.7°F | Ht 61.0 in | Wt 126.4 lb

## 2013-05-11 DIAGNOSIS — C73 Malignant neoplasm of thyroid gland: Secondary | ICD-10-CM | POA: Diagnosis not present

## 2013-05-11 DIAGNOSIS — I679 Cerebrovascular disease, unspecified: Secondary | ICD-10-CM

## 2013-05-11 DIAGNOSIS — E785 Hyperlipidemia, unspecified: Secondary | ICD-10-CM

## 2013-05-11 DIAGNOSIS — I10 Essential (primary) hypertension: Secondary | ICD-10-CM | POA: Diagnosis not present

## 2013-05-11 DIAGNOSIS — C4490 Unspecified malignant neoplasm of skin, unspecified: Secondary | ICD-10-CM

## 2013-05-11 DIAGNOSIS — M545 Low back pain: Secondary | ICD-10-CM

## 2013-05-11 DIAGNOSIS — E032 Hypothyroidism due to medicaments and other exogenous substances: Secondary | ICD-10-CM

## 2013-05-11 MED ORDER — ASPIRIN-DIPYRIDAMOLE ER 25-200 MG PO CP12: ORAL_CAPSULE | ORAL | Status: DC

## 2013-05-11 NOTE — Progress Notes (Signed)
Subjective:    Patient ID: [name], female    DOB: January 26, 1941, 72 y.o.   MRN: [id number]  HPI 72 y/o WF here for a follow up visit... she has mult med problems as noted below>  ~  February 27, 2010:  BP remains well controlled on Metoprolol, Norvasc, Lasix... no cerebrovasc symptoms on Aggrenox & due for f/u CDopplers in May (stable mild carotid dis)... Chol also improved w/ her Simva20... she continues f/u w/ DrKerr for her Thyroid Ca...  ~  August 28, 2010:  she is sched for an I-131 total body scan tomorrow per DrKerr (Neg- no evid met dis)... recent thyroid blood tests were OK- thyroglob undetectable & TSH= 0.42 w/ FreeT4= 1.28 (0.61-1.12)... BP remains controlled on her 3 med regimen;  no CP, palpit, SOB, or cerebral ischemic symtpoms on her Aggrenox;  Lipids remain controlled w/ diet + Simva20;  otherw stable- no new complaints or concerns, refills written today, had Flu shot 9/11.  ~  February 20, 2011:  782mo ROV & she remains stable>  She saw DrKerr last week & Thyroid status is stable, TSH 0.36 on Synthroid100, Ultrasound shows no recurrent or resid thyroid tissue or adenopathy;  Also had CDoppler- stable, mild plaque in bulbs & 0-39% bilat ICA stenoses...  FLP looks good on Simva20; BP well controlled on meds; and no cerebral ischemic symptoms on the Aggrenox...  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 BP remains controlled on Metop, Amlodip, Furosem;  She is asymptomatic w/o CP, palpit, dizzy, SOB, edema, or cerebral ischemic symptoms on her Aggrenox Bid;  Chol looks good on Simva20- see prob list below>> she had flu vaccine in Sept...  ~  Mar 31, 2012:  782mo ROV & she reports sudden right hip pain yest- went to see DrHilts & he Dx Shingles & Rx w/ Valtrex & Vicodin; we  discussed Shingles vaccine once she's recovered; otherw doing well- BP controlled, Chol at goals, etc...    She saw DrPerry for GI 12/12 w/ colonoscopy done> 2 polyps in asc colon= tubular adenomas & f/u suggested in 66yrs...    She had f/u eval DrKerr 4/13 w/ hx papillary thyroid cancer> s/p thyroidectomy & RAI therapy; on Levothy11mcg/d; ThySonar 4/13> no resid thyroid tissue or mass; "Everything was fine, no sign of cancer coming back" she says.    She had BMD at [facility] 12/12 per Gyn> TScore in left FemNeck -1.9    She sees DrLupton for Derm> "I have keratosis" she says & treated w/ topical cortisone cream... 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  We reviewed the following medical problems during today's office visit>>     AR> on Zyrtek, Mucinex; stable on prn med rx...    HBP, MVP> on Metop50, Amlod10, Lasix20-1/2daily; BP= 138/76 & she denies CP, palpit, dizzy, SOB, edema, etc...    Cerebrovasc dis & TIA> on Aggrenox25-200Bid; she remains asymptomatic w/o cerebral ischemic symptoms...    CHOL> on Simva20;  FLP shows TChol 170, TG 89, HDL 52, LDL 100; continue same Rx & diet efforts...    Thyroid cancer & hypothy> on Synthroid100; followed by DrKerr Q8mo- seen 11/13 & doing well...    GI- colon polyps> followed by DrPerry w/ last colon 12/12 showing 2 sm adenomatous polyps removed & f/u planned 59yrs...    LBP> off Vicodin & using OTC pain meds prn; she had eval DrDuda & ESI from [facility] in 2009; doing well w/o signif recurrent pain...    Osteopenia> on calcium, MVI, VitD; prev on Fosamax but stopped by GYN ?2008 & they follow her BMDs...    Skin Cancer> new- in left ext ear canal w/ surg planned by Derm 1/14...    Shingles> she had the  shingles vaccine prev; developed rt leg rash/pain & eval by Ortho- DrHilts 12/13- treated w/ Valtrex, Pred, Vicodin & resolved... We reviewed prob list, meds, xrays and labs> see below for updates >> she had the flu vaccine 9/13 & requests refill prescriptions today... CXR 1/14 showed norm heart size, clear lungs-wnl, clips in lower neck from thyroid surg... LABS 1/14: FLP- at goals on Simva20;  Chems- wnl;  CBC- wnl;  VitD= 69;  Thyroid per DrKerr...  ~  May 11, 2013:  91mo ROV & [name] is doing quite well- no new complaints or concerns;  She had a recent Basal cell ca removed from her left ear 7 is sched for [facility]'s [facility] soon...     BP is controlled on MetopER50-1/2 tab, Amlod10, and Lasix20-1/2tab daily; BP= 130/70 & she denies HA, dizzy, CP, palpit, SOB, edema, etc...    Chol has been well managed w/ diet + Simva20 w/ FLP 1/14 showing all parameters wnl...    She remains on Aggrenox Bid & denies all cerebral ischemic symptoms...     She continues to f/u w/ drKerr for her Hx thyroid cancer, s/p total thyroidectomy in 2010; seen 6/14 & stable on Synthroid115mcg/d (labs reviewed)... We reviewed prob list, meds, xrays and labs> see below for updates >>          Problem List:  ALLERGIC RHINITIS (ICD-477.9) - we discussed Rx w/ Zyrtek, Saline, Mucinex OTC...  HYPERTENSION (ICD-401.9) - on METOPROLOL XL 50mg /d, NORVASC 10mg /d, & LASIX 20mg - 1/2 tab daily...   ~  CXR 12/10 showed mild cardiomeg, clear lungs, NAD... ~  10/11:  BP=134/86, doing well w/ med + home BP checks... denies HA, fatigue, visual changes, CP, palipit, dizziness, syncope, dyspnea, edema, etc... ~  4/12:  BP= 120/76 & she continues to remain asymptomatic... ~  11/12:  BP= 122/72 & she continues stable w/o CP, palpit, dizzy, SOB, edema, or cerebral ischemic symptoms on her Aggrenox Bid. ~  5/13:  BP= 138/72 & she denies CP, palpit, SOB, edema, etc... ~  1/14: on Metop50, Amlod10, Lasix20-1/2daily; BP= 138/76 &  she denies CP, palpit, dizzy, SOB, edema, etc... ~  CXR 1/14 showed normal heart size, clear lungs, surg clips in low neck, NAD.[name]. ~  7/14: BP is controlled on MetopER50-1/2 tab, Amlod10, and Lasix20-1/2tab daily; BP= 130/70 & she denies HA, dizzy, CP, palpit, SOB, edema, etc.  MITRAL VALVE PROLAPSE (ICD-424.0) - clinical diagnosis in the past... no recent symptoms of CP or palpit... can't find prev 2DEcho...  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 CDoppler's w/ non-obstructive plaque right carotid bulb, no signif ICA stenoses... ~  f/u CDoppler 5/10 showed mild plaque  bilat, 0-39% blat ICA stenoses... f/u 78yr. ~  f/u CDoppler 5/11 showed mild plaque bilat, 0-39% bilat ICA stenoses, no change, f/u 30yr. ~  f/u CDoppler 4/12 showed mild plaque in bulbs, 0-39% bilat ICA stenoses, stable, repeat rec in 31yrs. ~  f/u CDopplers 4/14 showed mild heterogeneous plaque bilat, stable 0-39% bilat ICA stenoses, f/u 2 yrs...  HYPERLIPIDEMIA (ICD-272.4) - on SIMVASTATIN 20mg /d... she tried off meds 3/09 due to leg pains- but further eval showed HNP...  ~  pre-treatment FLP 3/04 showed TChol 282, TG 210, HDL 56, LDL 184... statin Rx started... ~  f/u FLP's on Statin Rx were good:  TChol 150-166, TG 87-119, HDL 35-49, LDL 86-107... ~  FLP 10/08 on Zocor20/d showed TChol 174, TG 136, HDL 47, LDL 99... ~  FLP 4/09 off Zocor showed TChol 285, TG 192, HDL 56, LDL 200... rec- restart Zocor20. ~  FLP 11/09 on Simva20 showed TChol 189, Tg 142, HDL 46, LDL 115... rec> continue same. ~  FLP 4/10 on Simva20 showed TChol 193, TG 114, HDL 55, LDL 116... may need incr dose. 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 OTHER IATROGENIC HYPOTHYROIDISM (ICD-244.3) - on SYNTHROID 149mcg/d ~  right lower pole thyroid nodule noted Apr10> eval revealed a papillary carcinoma on the right, and Hashimoto's disease on the left- s/p total thyroidectomy 7/10 by DrCornett...  ~  Endocrine eval by DrKerr 9/10 w/ radioactive iodine therapy- 106 mCi I- 131 given and scan showed only uptake in neck... ~  labs 4/10 showed TSH = 1.30 ~  9/10:  started on thyroid replacement by DrKerr- currently Synthroid 112 micrograms/d. ~  12/10: Synthroid decreased to 184mcg/d due to ?elevated BP?... labs followed by DrKerr. ~  9/11:  f/u DrKerr doing well- labs on SYNTHROID 147mcg/d= OK, and I-131 whole body scan reported neg. ~  4/12:  She continues regular f/u DrKerr> TSH= 0.36, Thyroid Ultrasound w/o recurrent or resid thyroid tissue seen... ~  4/13:  F/u DrKerr 4/13 w/ hx papillary thyroid cancer> s/p thyroidectomy & RAI therapy; on Levothy158mcg/d; ThySonar 4/13> no resid thyroid tissue or mass; "Everything was fine, no sign of cancer coming back" she says. ~  11/13: she saw DrKerr for 53mo ROV> papillary thyr ca, follicular variant- post surg hypothyroidism on Synthroid100; TSH=0.52; Throglob level remains undetectable... ~  Thyroid Ultrasound 6/14 showed no resid thyroid tissue & no adenopathy... ~  She continues regular Q77mo follow up visits w/ DrKerr...  COLONIC POLYPS (ICD-211.3) - colonoscopy 8/09 by DrPerry w/ several adenomatous polyps removed... f/u planned 5 yrs. ~  She saw DrPerry 12/12 w/ colonoscopy done> 2 polyps in asc colon= tubular adenomas & f/u suggested in 72yrs...  BACK PAIN, LUMBAR (ICD-724.2) - eval DrDuda 4/09 w/ HNP L5-S1 and treated w/ epid steroid shot by DrNewton and improved...  OSTEOPENIA (ICD-733.90) - prev on Fosamax but this was stopped by GYN  after last BMD (2008) at [facility] showed improved BMD... she takes Caltrate, Vits, Vit D... ~  labs 4/09 showed Vit  D level = 33 therefore started Vit D OTC 11-1998 daly. ~  labs 4/10 showed Vit D level = 59 on 1000 u daily. ~  pt reports that f/u BMD 9/10 was "excellent"... ~  labs 4/11 showed Vit d level = 39... rec> continue 1000 u daily. 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 stable on AGGRENOX Bid... ~  4/12:  She continues w/o cerebral ischemic symptoms...  Skin Cancer - BCE removed from left ankle by DrNolan 8/10... Another removed from nose & followed by Moh's surg... ~  She has had several Basal cells removed w/ subseq Moh's surg at the [facility]...  Hx Shingles >> she's had the Shingles vaccine... ~  5/13:  She reports sudden right hip pain w/ eval DrHilts- Shingles Rx w/ Valtrex & Vicodin by him... ~  12/13: she had an outbreak on right leg w/ pain; went to her Ortho- DrHilts & treated w/ Valtrex, Pred, Vicodin and resolved...  Health Maintenance:   ~  last colonoscopy 8/09 by DrPerry> see above. ~  GYN= DrRichardson... pt states she is seen every other yr & Mammogram/ BMD at [facility]. ~  Immunizations:  Tetanus/ TDAP updated 2010;  Pneumovax in 2006 at age 3;  she gets the Flu shots yearly...   Past Surgical History  Procedure Laterality Date  . Dilation and curettage of uterus    . Total thyroidectomy    . Polypectomy    . Colonoscopy      Outpatient Encounter Prescriptions as of 05/11/2013  Medication Sig Dispense Refill  . AGGRENOX 25-200 MG per 12 hr capsule TAKE ONE CAPSULE BY MOUTH TWICE A DAY  60 capsule  7  . amLODipine (NORVASC) 10 MG tablet Take 1 tablet (10 mg total) by mouth daily.  30 tablet  11  . Calcium Carbonate-Vitamin D (CALCIUM 600+D) 600-400 MG-UNIT per tablet Take 1 tablet by  mouth daily.       . cetirizine (ZYRTEC) 5 MG tablet Take 5 mg by mouth as needed.        . Cholecalciferol (VITAMIN D) 2000 UNITS CAPS Take 1 capsule by mouth daily.        [name] dextromethorphan-guaiFENesin (MUCINEX DM) 30-600 MG per 12 hr tablet Take 1 tablet by mouth as needed.       . furosemide (LASIX) 20 MG tablet Take 1/2 daily as needed for swelling  30 tablet  11  . levothyroxine (SYNTHROID, LEVOTHROID) 100 MCG tablet Take 100 mcg by mouth daily.        . metoprolol succinate (TOPROL-XL) 50 MG 24 hr tablet Take 1 tablet (50 mg total) by mouth daily. Take with or immediately following a meal.  30 tablet  11  . Multiple Vitamins-Minerals (CENTRUM SILVER PO) Take 1 tablet by mouth daily.        . simvastatin (ZOCOR) 20 MG tablet Take 1 tablet (20 mg total) by mouth at bedtime.  30 tablet  11   Facility-Administered Encounter Medications as of 05/11/2013  Medication Dose Route Frequency Provider Last Rate Last Dose  . 0.9 %  sodium chloride infusion  500 mL Intravenous Continuous Hilarie Fredrickson, MD        Allergies  Allergen Reactions  . Sulfonamide Derivatives     REACTION: rash    Current Medications, Allergies,  Past Medical History, Past Surgical History, Family History, and Social History were reviewed in [employer] record.    Review of Systems        See HPI - all other systems neg except as noted... The patient denies anorexia, fever, weight loss, weight gain, vision loss, decreased hearing, hoarseness, chest pain, syncope, dyspnea on exertion, peripheral edema, prolonged cough, headaches, hemoptysis, abdominal pain, melena, hematochezia, severe indigestion/heartburn, hematuria, incontinence, muscle weakness, suspicious skin lesions, transient blindness, difficulty walking, depression, unusual weight change, abnormal bleeding, enlarged lymph nodes, and angioedema.     Objective:   Physical Exam     WD, WN, 72 y/o WF in NAD... GENERAL:  Alert & oriented;  pleasant & cooperative... HEENT:  [place]/AT, EOM-wnl, PERRLA, pterygium left eye, EACs-clear, TMs-wnl, NOSE-clear, THROAT-clear & wnl. NECK:  Supple w/ fairROM; no JVD; normal carotid impulses w/o bruits; scar of prev thyroid surg, no nodules, no adenopathy. CHEST:  Clear to P & A; without wheezes/ rales/ or rhonchi. HEART:  Regular Rhythm; without murmurs/ rubs/ or gallops. ABDOMEN:  Soft & nontender; normal bowel sounds; no organomegaly or masses detected. EXT: without deformities or arthritic changes; no varicose veins/ venous insuffic/ or edema. NEURO:  CN's intact; motor testing normal; sensory testing normal; gait normal & balance OK. DERM:  Small raised lesion inside left pinna/ ear canal...  RADIOLOGY DATA:  Reviewed in the EPIC EMR & discussed w/ the patient...  LABORATORY DATA:  Reviewed in the EPIC EMR & discussed w/ the patient...   Assessment & Plan:    HBP>  Controlled on meds, tol well, asymptomatic, continue same...  Cerebrovasc Dis & hx TIA>  She remains asymptomatic w/o cerebral ischemic symptoms, continue Agrennox...  HYPERLIPID>  Stable on diet + Simva20...  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  GI> Hx polyps>  She is up to date and had f/u colonoscopy 12/12 as above...  Osteopenia>  Managed by GYN DrRichardson & pt reports that her last BMD was "excellent"...  Skin Cancer>  She reports Moh's surg for BCE on nose, plus several "keratoses"; new lesion inside left pinna w/ surg pending...  Other medical issues as noted...   Patient's Medications  New Prescriptions   No medications on file  Previous Medications   AMLODIPINE (NORVASC) 10 MG TABLET    Take 1 tablet (10 mg total) by mouth daily.   CALCIUM CARBONATE-VITAMIN D (CALCIUM 600+D) 600-400 MG-UNIT PER TABLET    Take 1 tablet by mouth daily.    CETIRIZINE (ZYRTEC) 5 MG TABLET    Take 5 mg by mouth as needed.     CHOLECALCIFEROL (VITAMIN  D) 1000 UNITS TABLET    Take 1,000 Units by mouth daily.   DEXTROMETHORPHAN-GUAIFENESIN (MUCINEX DM) 30-600 MG PER 12 HR TABLET    Take 1 tablet by mouth as needed.    FUROSEMIDE (LASIX) 20 MG TABLET    Take 1/2 daily as needed for swelling   LEVOTHYROXINE (SYNTHROID, LEVOTHROID) 100 MCG TABLET    Take 100 mcg by mouth daily.     METOPROLOL SUCCINATE (TOPROL-XL) 50 MG 24 HR TABLET    Take 1 tablet (50 mg total) by mouth daily. Take with or immediately following a meal.   MULTIPLE VITAMINS-MINERALS (CENTRUM SILVER PO)    Take 1 tablet by mouth daily.     SIMVASTATIN (ZOCOR) 20 MG TABLET    Take 1 tablet (20 mg total) by mouth  at bedtime.   VITAMIN E 400 UNIT CAPSULE    Take 400 Units by mouth daily.  Modified Medications   Modified Medication Previous Medication   DIPYRIDAMOLE-ASPIRIN (AGGRENOX) 200-25 MG PER 12 HR CAPSULE AGGRENOX 25-200 MG per 12 hr capsule      TAKE ONE CAPSULE BY MOUTH TWICE A DAY    TAKE ONE CAPSULE BY MOUTH TWICE A DAY  Discontinued Medications   CHOLECALCIFEROL (VITAMIN D) 2000 UNITS CAPS    Take 1 capsule by mouth daily.

## 2013-05-11 NOTE — Patient Instructions (Addendum)
Today we updated your med list in our EPIC system...    Continue your current medications the same...    We refilled the meds you requested...  Call for any questions...  Let's plan a follow up visit in 43mo, sooner if needed for problems.Marland KitchenMarland Kitchen

## 2013-05-31 DIAGNOSIS — C44319 Basal cell carcinoma of skin of other parts of face: Secondary | ICD-10-CM | POA: Diagnosis not present

## 2013-06-12 DIAGNOSIS — H2510 Age-related nuclear cataract, unspecified eye: Secondary | ICD-10-CM | POA: Diagnosis not present

## 2013-06-12 DIAGNOSIS — H11049 Peripheral pterygium, stationary, unspecified eye: Secondary | ICD-10-CM | POA: Diagnosis not present

## 2013-06-22 DIAGNOSIS — M5430 Sciatica, unspecified side: Secondary | ICD-10-CM | POA: Diagnosis not present

## 2013-07-04 DIAGNOSIS — IMO0002 Reserved for concepts with insufficient information to code with codable children: Secondary | ICD-10-CM | POA: Diagnosis not present

## 2013-07-26 ENCOUNTER — Ambulatory Visit (INDEPENDENT_AMBULATORY_CARE_PROVIDER_SITE_OTHER): Payer: Medicare Other

## 2013-07-26 DIAGNOSIS — Z23 Encounter for immunization: Secondary | ICD-10-CM

## 2013-11-22 ENCOUNTER — Ambulatory Visit (INDEPENDENT_AMBULATORY_CARE_PROVIDER_SITE_OTHER)
Admission: RE | Admit: 2013-11-22 | Discharge: 2013-11-22 | Disposition: A | Discharge status: Home / Self Care | Payer: Medicare Other | Source: Ambulatory Visit | Attending: Pulmonary Disease | Admitting: Pulmonary Disease

## 2013-11-22 ENCOUNTER — Ambulatory Visit (INDEPENDENT_AMBULATORY_CARE_PROVIDER_SITE_OTHER): Payer: Medicare Other | Admitting: Pulmonary Disease

## 2013-11-22 ENCOUNTER — Other Ambulatory Visit (INDEPENDENT_AMBULATORY_CARE_PROVIDER_SITE_OTHER): Payer: Medicare Other

## 2013-11-22 ENCOUNTER — Encounter: Payer: Self-pay | Admitting: Pulmonary Disease

## 2013-11-22 VITALS — BP 110/68 | HR 64 | Temp 96.9°F | Ht 61.0 in | Wt 129.8 lb

## 2013-11-22 DIAGNOSIS — I10 Essential (primary) hypertension: Secondary | ICD-10-CM | POA: Diagnosis not present

## 2013-11-22 DIAGNOSIS — F419 Anxiety disorder, unspecified: Secondary | ICD-10-CM

## 2013-11-22 DIAGNOSIS — I679 Cerebrovascular disease, unspecified: Secondary | ICD-10-CM | POA: Diagnosis not present

## 2013-11-22 DIAGNOSIS — D126 Benign neoplasm of colon, unspecified: Secondary | ICD-10-CM

## 2013-11-22 DIAGNOSIS — M25519 Pain in unspecified shoulder: Secondary | ICD-10-CM

## 2013-11-22 DIAGNOSIS — M949 Disorder of cartilage, unspecified: Secondary | ICD-10-CM

## 2013-11-22 DIAGNOSIS — C73 Malignant neoplasm of thyroid gland: Secondary | ICD-10-CM

## 2013-11-22 DIAGNOSIS — M899 Disorder of bone, unspecified: Secondary | ICD-10-CM

## 2013-11-22 DIAGNOSIS — C4490 Unspecified malignant neoplasm of skin, unspecified: Secondary | ICD-10-CM

## 2013-11-22 DIAGNOSIS — E032 Hypothyroidism due to medicaments and other exogenous substances: Secondary | ICD-10-CM

## 2013-11-22 DIAGNOSIS — M25511 Pain in right shoulder: Secondary | ICD-10-CM

## 2013-11-22 DIAGNOSIS — E785 Hyperlipidemia, unspecified: Secondary | ICD-10-CM | POA: Diagnosis not present

## 2013-11-22 DIAGNOSIS — F411 Generalized anxiety disorder: Secondary | ICD-10-CM | POA: Diagnosis not present

## 2013-11-22 DIAGNOSIS — J309 Allergic rhinitis, unspecified: Secondary | ICD-10-CM

## 2013-11-22 DIAGNOSIS — M545 Low back pain, unspecified: Secondary | ICD-10-CM

## 2013-11-22 LAB — BASIC METABOLIC PANEL
BUN: 14 mg/dL (ref 6–23)
CHLORIDE: 104 meq/L (ref 96–112)
CO2: 26 meq/L (ref 19–32)
Calcium: 9.3 mg/dL (ref 8.4–10.5)
Creatinine, Ser: 0.6 mg/dL (ref 0.4–1.2)
GFR: 103.92 mL/min (ref >60.00)
Glucose, Bld: 97 mg/dL (ref 70–99)
Potassium: 4 mEq/L (ref 3.5–5.1)
SODIUM: 139 meq/L (ref 135–145)

## 2013-11-22 LAB — CBC WITH DIFFERENTIAL/PLATELET
BASOS ABS: 0 10*3/uL (ref 0.0–0.1)
Basophils Relative: 0.6 % (ref 0.0–3.0)
EOS ABS: 0.2 10*3/uL (ref 0.0–0.7)
Eosinophils Relative: 2.8 % (ref 0.0–5.0)
HCT: 42 % (ref 36.0–46.0)
Hemoglobin: 14.3 g/dL (ref 12.0–15.0)
LYMPHS PCT: 24.3 % (ref 12.0–46.0)
Lymphs Abs: 1.5 10*3/uL (ref 0.7–4.0)
MCHC: 34 g/dL (ref 30.0–36.0)
MCV: 88.4 fl (ref 78.0–100.0)
MONOS PCT: 7.3 % (ref 3.0–12.0)
Monocytes Absolute: 0.4 10*3/uL (ref 0.1–1.0)
NEUTROS PCT: 65 % (ref 43.0–77.0)
Neutro Abs: 4 10*3/uL (ref 1.4–7.7)
Platelets: 273 10*3/uL (ref 150.0–400.0)
RBC: 4.75 Mil/uL (ref 3.87–5.11)
RDW: 12.9 % (ref 11.5–14.6)
WBC: 6.1 10*3/uL (ref 4.5–10.5)

## 2013-11-22 LAB — LIPID PANEL
Cholesterol: 171 mg/dL (ref 0–200)
HDL: 56 mg/dL (ref >39.00)
LDL Cholesterol: 92 mg/dL (ref 0–99)
Total CHOL/HDL Ratio: 3
Triglycerides: 114 mg/dL (ref 0.0–149.0)
VLDL: 22.8 mg/dL (ref 0.0–40.0)

## 2013-11-22 LAB — HEPATIC FUNCTION PANEL
ALK PHOS: 63 U/L (ref 39–117)
ALT: 25 U/L (ref 0–35)
AST: 23 U/L (ref 0–37)
Albumin: 4.5 g/dL (ref 3.5–5.2)
Bilirubin, Direct: 0 mg/dL (ref 0.0–0.3)
TOTAL PROTEIN: 7.8 g/dL (ref 6.0–8.3)
Total Bilirubin: 0.6 mg/dL (ref 0.3–1.2)

## 2013-11-22 LAB — TSH: TSH: 0.42 u[IU]/mL (ref 0.35–5.50)

## 2013-11-22 MED ORDER — ASPIRIN-DIPYRIDAMOLE ER 25-200 MG PO CP12: ORAL_CAPSULE | ORAL | Status: DC

## 2013-11-22 MED ORDER — SIMVASTATIN 20 MG PO TABS: 20.0000 mg | ORAL_TABLET | Freq: Every day | ORAL | Status: DC

## 2013-11-22 MED ORDER — TRAMADOL HCL 50 MG PO TABS: 50.0000 mg | ORAL_TABLET | Freq: Three times a day (TID) | ORAL | Status: DC | PRN

## 2013-11-22 MED ORDER — AMLODIPINE BESYLATE 10 MG PO TABS: 10.0000 mg | ORAL_TABLET | Freq: Every day | ORAL | Status: DC

## 2013-11-22 MED ORDER — METOPROLOL SUCCINATE ER 50 MG PO TB24: 50.0000 mg | ORAL_TABLET | Freq: Every day | ORAL | Status: DC

## 2013-11-22 MED ORDER — FUROSEMIDE 20 MG PO TABS: ORAL_TABLET | ORAL | Status: DC

## 2013-11-22 NOTE — Progress Notes (Signed)
Subjective:    Patient ID: [name], female    DOB: Apr 22, 1941, 73 y.o.   MRN: [id number]  HPI 73 y/o WF here for a follow up visit... she has mult med problems as noted below>  ~  Mar 31, 2012:  44mo ROV & she reports sudden right hip pain yest- went to see DrHilts & he Dx Shingles & Rx w/ Valtrex & Vicodin; we discussed Shingles vaccine once she's recovered; otherw doing well- BP controlled, Chol at goals, etc...    She saw DrPerry for GI 12/12 w/ colonoscopy done> 2 polyps in asc colon= tubular adenomas & f/u suggested in 41yrs...    She had f/u eval DrKerr 4/13 w/ hx papillary thyroid cancer> s/p thyroidectomy & RAI therapy; on Levothy149mcg/d; ThySonar 4/13> no resid thyroid tissue or mass; "Everything was fine, no sign of cancer coming back" she says.    She had BMD at [facility] 12/12 per Gyn> TScore in left FemNeck -1.9    She sees DrLupton for Derm> "I have keratosis" she says & treated w/ topical cortisone cream... 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  We reviewed the following medical problems during today's office visit>>     AR> on Zyrtek, Mucinex; stable on prn med rx...    HBP, MVP> on Metop50, Amlod10, Lasix20-1/2daily; BP= 138/76 & she denies CP, palpit, dizzy, SOB, edema, etc...    Cerebrovasc dis & TIA> on Aggrenox25-200Bid; she remains asymptomatic w/o cerebral ischemic symptoms...    CHOL> on Simva20; FLP shows TChol 170, TG 89, HDL 52, LDL 100; continue same Rx & diet efforts...    Thyroid cancer & hypothy> on Synthroid100; followed by DrKerr Q58mo- seen 11/13 & doing well...    GI- colon polyps> followed by DrPerry w/ last colon 12/12 showing 2 sm adenomatous polyps removed & f/u planned 78yrs...    LBP> off  Vicodin & using OTC pain meds prn; she had eval DrDuda & ESI from [facility] in 2009; doing well w/o signif recurrent pain...    Osteopenia> on calcium, MVI, VitD; prev on Fosamax but stopped by GYN ?2008 & they follow her BMDs...    Skin Cancer> new- in left ext ear canal w/ surg planned by Derm 1/14...    Shingles> she had the shingles vaccine prev; developed rt leg rash/pain & eval by Ortho- DrHilts 12/13- treated w/ Valtrex, Pred, Vicodin & resolved... We reviewed prob list, meds, xrays and labs> see below for updates >> she had the flu vaccine 9/13 & requests refill prescriptions today...  CXR 1/14 showed norm heart size, clear lungs-wnl, clips in lower neck from thyroid surg...  LABS 1/14: FLP- at goals on Simva20;  Chems- wnl;  CBC- wnl;  VitD= 69;  Thyroid per DrKerr...  ~  May 11, 2013:  1mo ROV & [name] is doing quite well- no new complaints or concerns;  She had a recent Basal cell ca removed from her left ear 7 is sched for Moh's at [facility] soon...     BP is controlled on MetopER50-1/2 tab, Amlod10, and Lasix20-1/2tab daily; BP= 130/70 & she denies HA, dizzy, CP, palpit, SOB, edema, etc...    Chol has been well managed w/ diet +  Simva20 w/ FLP 1/14 showing all parameters wnl...    She remains on Aggrenox Bid & denies all cerebral ischemic symptoms...     She continues to f/u w/ DrKerr for her Hx thyroid cancer, s/p total thyroidectomy in 2010; seen 6/14 & stable on Synthroid128mcg/d (labs reviewed)... We reviewed prob list, meds, xrays and labs> see below for updates >>   ~  November 22, 2013:  15mo ROV & [name] is c/o incr pain in right shoulder- we discussed getting an XRay & referral to Ortho, DrDuda... She had epid steroid injection in her back 8/14 w/ some relief...     Allergies controlled w/ Zyrtek & Mucinex...    BP is regulated w/ MetopER50, Amlod10, Lasix20-1/2Qam; BP= 110/68 & she denies CP, palpit, SOB, edema, etc...    She remains on AggrenoxBid & denies  cerebral ischemic symptoms...    FLP looks good on Simva20 w/ TChol 171, TG 114, HDL 56, LDL 92     She continues to f/u w/ DrKerr (seen 6/14) for her Hx thyroid cancer, s/p total thyroidectomy in 2010; seen 6/14 & stable on Synthroid160mcg/d We reviewed prob list, meds, xrays and labs> see below for updates >> she had the 2014 Flu vaccine 9/14...  Right shoulder film> Prominent ossified density in the soft tissues anterior to the right humerus, could be related to severe calcific subscapularis tendinitis and/or prior tear, could be related to a prominent loose body. MRI recommended & we will refer to Ortho...  LABS 1/15:  FLP- at goals on Simva20;  Chems- wnl;  CBC- wnl;  TSH=0.42 on Levo100...          Problem List:  ALLERGIC RHINITIS (ICD-477.9) - we discussed Rx w/ Zyrtek, Saline, Mucinex OTC...  HYPERTENSION (ICD-401.9) - on METOPROLOL XL 50mg /d, NORVASC 10mg /d, & LASIX 20mg - 1/2 tab daily...   ~  CXR 12/10 showed mild cardiomeg, clear lungs, NAD... ~  10/11:  BP=134/86, doing well w/ med + home BP checks... denies HA, fatigue, visual changes, CP, palipit, dizziness, syncope, dyspnea, edema, etc... ~  4/12:  BP= 120/76 & she continues to remain asymptomatic... ~  11/12:  BP= 122/72 & she continues stable w/o CP, palpit, dizzy, SOB, edema, or cerebral ischemic symptoms on her Aggrenox Bid. ~  5/13:  BP= 138/72 & she denies CP, palpit, SOB, edema, etc... ~  1/14: on Metop50, Amlod10, Lasix20-1/2daily; BP= 138/76 & she denies CP, palpit, dizzy, SOB, edema, etc... ~  CXR 1/14 showed normal heart size, clear lungs, surg clips in low neck, NAD.[name]. ~  7/14: BP is controlled on MetopER50-1/2 tab, Amlod10, and Lasix20-1/2tab daily; BP= 130/70 & she denies HA, dizzy, CP, palpit, SOB, edema, etc. ~  1/15: BP is regulated w/ MetopER50, Amlod10, Lasix20-1/2Qam; BP= 110/68 & she denies CP, palpit, SOB, edema, etc.  MITRAL VALVE PROLAPSE (ICD-424.0) - clinical diagnosis in the past... no recent  symptoms of CP or palpit... can't find prev 2DEcho...  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 CDoppler's w/ non-obstructive plaque right carotid bulb, no signif ICA stenoses... ~  f/u CDoppler 5/10 showed mild plaque bilat, 0-39% blat ICA stenoses... f/u 16yr. ~  f/u CDoppler 5/11 showed mild plaque bilat, 0-39% bilat ICA stenoses, no change, f/u 38yr. ~  f/u CDoppler 4/12 showed mild plaque in bulbs, 0-39% bilat ICA stenoses, stable, repeat rec in 87yrs. ~  f/u CDopplers 4/14 showed mild heterogeneous plaque bilat, stable 0-39% bilat ICA stenoses, f/u 2  yrs.Marland KitchenMarland Kitchen  HYPERLIPIDEMIA (ICD-272.4) - on SIMVASTATIN 20mg /d... she tried off meds 3/09 due to leg pains- but further eval showed HNP...  ~  pre-treatment FLP 3/04 showed TChol 282, TG 210, HDL 56, LDL 184... statin Rx started... ~  f/u FLP's on Statin Rx were good:  TChol 150-166, TG 87-119, HDL 35-49, LDL 86-107... ~  [place] 10/08 on Zocor20/d showed TChol 174, TG 136, HDL 47, LDL 99... ~  [place] 4/09 off Zocor showed TChol 285, TG 192, HDL 56, LDL 200... rec- restart Zocor20. ~  [place] 11/09 on Simva20 showed TChol 189, Tg 142, HDL 46, LDL 115... rec> continue same. ~  FLP 4/10 on Simva20 showed TChol 193, TG 114, HDL 55, LDL 116... may need incr dose. 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  ~  Endocrine eval by DrKerr 9/10 w/ radioactive iodine therapy- 106 mCi I- 131 given and scan showed only uptake in neck... ~  labs 4/10 showed TSH = 1.30 ~  9/10:  started on thyroid replacement by DrKerr- currently Synthroid 112 micrograms/d. ~  12/10: Synthroid decreased to 155mcg/d due to ?elevated BP?... labs followed by DrKerr. ~  9/11:  f/u DrKerr doing well- labs on SYNTHROID 146mcg/d= OK, and I-131 whole body scan reported neg. ~  4/12:  She continues regular f/u DrKerr> TSH= 0.36, Thyroid Ultrasound w/o recurrent or resid thyroid tissue seen... ~  4/13:  F/u DrKerr 4/13 w/ hx papillary thyroid cancer> s/p thyroidectomy & RAI therapy; on Levothy131mcg/d; ThySonar 4/13> no resid thyroid tissue or mass; "Everything was fine, no sign of cancer coming back" she says. ~  11/13: she saw DrKerr for 85mo ROV> papillary thyr ca, follicular variant- post surg hypothyroidism on Synthroid100; TSH=0.52; Throglob level remains undetectable... ~  Thyroid Ultrasound 6/14 showed no resid thyroid tissue & no adenopathy... ~  She continues regular Q51mo follow up visits w/ DrKerr...  COLONIC POLYPS (ICD-211.3) - colonoscopy 8/09 by DrPerry w/ several adenomatous polyps removed... f/u planned 5 yrs. ~  She saw DrPerry 12/12 w/ colonoscopy done> 2 polyps in asc colon= tubular adenomas & f/u suggested in 56yrs...  BACK PAIN, LUMBAR (ICD-724.2) - eval DrDuda 4/09 w/ HNP L5-S1 and treated w/ epid steroid shot by DrNewton and improved...  OSTEOPENIA (ICD-733.90) - prev on Fosamax but this was stopped by GYN after last BMD (2008) at [facility] showed improved BMD... she takes Caltrate, Vits, Vit D... ~  labs 4/09 showed Vit D level = 33 therefore started Vit D OTC 11-1998 daly. ~  labs 4/10 showed Vit D level = 59 on 1000 u daily. ~  pt reports that f/u BMD 9/10 was "excellent"... ~  labs 4/11 showed  Vit d level =  39... rec> continue 1000 u daily. 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 stable on AGGRENOX Bid... ~  4/12:  She continues w/o cerebral ischemic symptoms...  Skin Cancer - BCE removed from left ankle by DrNolan 8/10... Another removed from nose & followed by Moh's surg... ~  She has had several Basal cells removed w/ subseq Moh's surg at the [facility] cancer [facility]...  Hx Shingles >> she's had the Shingles vaccine... ~  5/13:  She reports sudden right hip pain w/ eval DrHilts- Shingles Rx w/ Valtrex & Vicodin by him... ~  12/13: she had an outbreak on right leg w/ pain; went to her Ortho- DrHilts & treated w/ Valtrex, Pred, Vicodin and resolved...  Health Maintenance:   ~  last colonoscopy 8/09 by DrPerry> see above. ~  GYN= DrRichardson... pt states she is seen every other yr & Mammogram/ BMD at [facility]. ~  Immunizations:  Tetanus/ TDAP updated 2010;  Pneumovax in 2006 at age 32;  she gets the Flu shots yearly...   Past Surgical History  Procedure Laterality Date  . Dilation and curettage of uterus    . Total thyroidectomy    . Polypectomy    . Colonoscopy    . Basal cell carcinoma removed from inside the left ear  11/04/2012    Outpatient Encounter Prescriptions as of 11/22/2013  Medication Sig  . amLODipine (NORVASC) 10 MG tablet Take 1 tablet (10 mg total) by mouth daily.  . Calcium Carbonate-Vitamin D (CALCIUM 600+D) 600-400 MG-UNIT per tablet Take 1 tablet by mouth daily.   . cetirizine (ZYRTEC) 5 MG tablet Take 5 mg by mouth as needed.    . cholecalciferol (VITAMIN D) 1000 UNITS tablet Take 1,000 Units by mouth daily.  [name] dextromethorphan-guaiFENesin (MUCINEX DM) 30-600 MG per 12 hr tablet Take 1 tablet by mouth as needed.   . dipyridamole-aspirin (AGGRENOX) 200-25 MG per 12 hr capsule TAKE ONE CAPSULE BY  MOUTH TWICE A DAY  . furosemide (LASIX) 20 MG tablet Take 1/2 daily as needed for swelling  . levothyroxine (SYNTHROID, LEVOTHROID) 100 MCG tablet Take 100 mcg by mouth daily.    . metoprolol succinate (TOPROL-XL) 50 MG 24 hr tablet Take 1 tablet (50 mg total) by mouth daily. Take with or immediately following a meal.  . Multiple Vitamins-Minerals (CENTRUM SILVER PO) Take 1 tablet by mouth daily.    . simvastatin (ZOCOR) 20 MG tablet Take 1 tablet (20 mg total) by mouth at bedtime.  . vitamin E 400 UNIT capsule Take 400 Units by mouth daily.    Allergies  Allergen Reactions  . Sulfonamide Derivatives     REACTION: rash    Current Medications, Allergies, Past Medical History, Past Surgical History, Family History, and Social History were reviewed in [employer] record.    Review of Systems        See HPI - all other systems neg except as noted... The patient denies anorexia, fever, weight loss, weight gain, vision loss, decreased hearing, hoarseness, chest pain, syncope, dyspnea on exertion, peripheral edema, prolonged cough, headaches, hemoptysis, abdominal pain, melena, hematochezia, severe indigestion/heartburn, hematuria, incontinence, muscle weakness, suspicious skin lesions, transient blindness, difficulty walking, depression, unusual weight change, abnormal bleeding, enlarged lymph nodes, and angioedema.     Objective:   Physical Exam  WD, WN, 73 y/o WF in NAD... GENERAL:  Alert & oriented; pleasant & cooperative... HEENT:  [place]/AT, EOM-wnl, PERRLA, pterygium left eye, EACs-clear, TMs-wnl, NOSE-clear, THROAT-clear & wnl. NECK:  Supple w/ fairROM; no JVD; normal carotid impulses w/o bruits; scar of prev thyroid surg, no nodules, no adenopathy. CHEST:  Clear to P & A; without wheezes/ rales/ or rhonchi. HEART:  Regular Rhythm; without murmurs/ rubs/ or gallops. ABDOMEN:  Soft & nontender; normal bowel sounds; no organomegaly or masses detected. EXT:  without deformities or arthritic changes; no varicose veins/ venous insuffic/ or edema. NEURO:  CN's intact; motor testing normal; sensory testing normal; gait normal & balance OK. DERM:  Small raised lesion inside left pinna/ ear canal...  RADIOLOGY DATA:  Reviewed in the EPIC EMR & discussed w/ the patient...  LABORATORY DATA:  Reviewed in the EPIC EMR & discussed w/ the patient...   Assessment & Plan:    HBP>  Controlled on meds, tol well, asymptomatic, continue same...  Cerebrovasc Dis & hx TIA>  She remains asymptomatic w/o cerebral ischemic symptoms, continue Agrennox...  HYPERLIPID>  Stable on diet + Simva20...  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  GI> Hx polyps>  She is up to date and had f/u colonoscopy 12/12 as above...  Osteopenia>  Managed by GYN DrRichardson & pt reports that her last BMD was "excellent"...  Skin Cancer>  She reports Moh's surg for BCE on nose, plus several "keratoses"; new lesion inside left pinna w/ surg pending...  Other medical issues as noted.Marland KitchenMarland Kitchen

## 2013-11-22 NOTE — Patient Instructions (Signed)
Today we updated your med list in our EPIC system...    Continue your current medications the same...    We refilled the meds you requested...  We wrote a new prescription for TRAMADOL 50mg  - take one tab up to 3 times daily as needed for pain...  Today we XRayed your right shoulder & did your follow up FASTING blood work...    We will contact you w/ the results when available...   Apply heat to the right shoulder & try the Tramadol...    If the pain persists the next step is to get DrDuda to check it & consider an injection...  Call for any questions...  Let's plan a follow up visit in 55mo, sooner if needed for problems.Marland KitchenMarland Kitchen

## 2013-11-27 ENCOUNTER — Telehealth: Payer: Self-pay | Admitting: Pulmonary Disease

## 2013-11-27 DIAGNOSIS — M25519 Pain in unspecified shoulder: Secondary | ICD-10-CM

## 2013-11-27 NOTE — Telephone Encounter (Signed)
Please notify patient> FLP looks good on Simva20> continue same... Chems, LFTs, CBC, Thyroid all look good/ WNL.Katherine KitchenMarland Bush Continue current meds and Synthroid100 the same .Please notify patient> XRay right shoulder shows a large rounded calcif density ant to right humerus> She needs ortho referral ASAP for this...   I spoke with patient about results and she verbalized understanding and had no questions. Referral placed

## 2013-12-07 DIAGNOSIS — M67919 Unspecified disorder of synovium and tendon, unspecified shoulder: Secondary | ICD-10-CM | POA: Diagnosis not present

## 2013-12-07 DIAGNOSIS — M719 Bursopathy, unspecified: Secondary | ICD-10-CM | POA: Diagnosis not present

## 2013-12-12 DIAGNOSIS — Z1231 Encounter for screening mammogram for malignant neoplasm of breast: Secondary | ICD-10-CM | POA: Diagnosis not present

## 2013-12-12 DIAGNOSIS — M899 Disorder of bone, unspecified: Secondary | ICD-10-CM | POA: Diagnosis not present

## 2013-12-12 DIAGNOSIS — M949 Disorder of cartilage, unspecified: Secondary | ICD-10-CM | POA: Diagnosis not present

## 2014-01-04 DIAGNOSIS — M67919 Unspecified disorder of synovium and tendon, unspecified shoulder: Secondary | ICD-10-CM | POA: Diagnosis not present

## 2014-01-17 ENCOUNTER — Encounter: Payer: Self-pay | Admitting: Pulmonary Disease

## 2014-01-25 ENCOUNTER — Encounter: Payer: Self-pay | Admitting: Pulmonary Disease

## 2014-03-05 ENCOUNTER — Other Ambulatory Visit: Payer: Self-pay | Admitting: Physician Assistant

## 2014-03-05 DIAGNOSIS — L819 Disorder of pigmentation, unspecified: Secondary | ICD-10-CM | POA: Diagnosis not present

## 2014-03-05 DIAGNOSIS — C44211 Basal cell carcinoma of skin of unspecified ear and external auricular canal: Secondary | ICD-10-CM | POA: Diagnosis not present

## 2014-03-05 DIAGNOSIS — D235 Other benign neoplasm of skin of trunk: Secondary | ICD-10-CM | POA: Diagnosis not present

## 2014-03-05 DIAGNOSIS — C4441 Basal cell carcinoma of skin of scalp and neck: Secondary | ICD-10-CM | POA: Diagnosis not present

## 2014-03-05 DIAGNOSIS — D1801 Hemangioma of skin and subcutaneous tissue: Secondary | ICD-10-CM | POA: Diagnosis not present

## 2014-03-05 DIAGNOSIS — L821 Other seborrheic keratosis: Secondary | ICD-10-CM | POA: Diagnosis not present

## 2014-03-29 ENCOUNTER — Telehealth: Payer: Self-pay | Admitting: Pulmonary Disease

## 2014-03-29 MED ORDER — AZITHROMYCIN 250 MG PO TABS: ORAL_TABLET | ORAL | Status: DC

## 2014-03-29 NOTE — Telephone Encounter (Signed)
Per SN---  Ok for the zpak to be sent to the pharmacy.  This has been sent to the pharmacy and the pt is aware. Nothing further is needed.

## 2014-03-29 NOTE — Telephone Encounter (Signed)
PT c/o sinus drainage, prod cough (green with specks of blood).  Denies fever, sob or wheezing.  Taking otc tussin, mucinex and zyrtec.  Pt states that a Zpak  Normally helps.  Please advise.  [employer] [address]  Allergies  Allergen Reactions  . Sulfonamide Derivatives     REACTION: rash    Current Outpatient Prescriptions on File Prior to Visit  Medication Sig Dispense Refill  . amLODipine (NORVASC) 10 MG tablet Take 1 tablet (10 mg total) by mouth daily.  30 tablet  11  . Calcium Carbonate-Vitamin D (CALCIUM 600+D) 600-400 MG-UNIT per tablet Take 1 tablet by mouth daily.       . cetirizine (ZYRTEC) 5 MG tablet Take 5 mg by mouth as needed.        . cholecalciferol (VITAMIN D) 1000 UNITS tablet Take 1,000 Units by mouth daily.      [name] dextromethorphan-guaiFENesin (MUCINEX DM) 30-600 MG per 12 hr tablet Take 1 tablet by mouth as needed.       . dipyridamole-aspirin (AGGRENOX) 200-25 MG per 12 hr capsule TAKE ONE CAPSULE BY MOUTH TWICE A DAY  60 capsule  11  . furosemide (LASIX) 20 MG tablet Take 1/2 daily as needed for swelling  30 tablet  11  . levothyroxine (SYNTHROID, LEVOTHROID) 100 MCG tablet Take 100 mcg by mouth daily.        . metoprolol succinate (TOPROL-XL) 50 MG 24 hr tablet Take 1 tablet (50 mg total) by mouth daily. Take with or immediately following a meal.  30 tablet  11  . Multiple Vitamins-Minerals (CENTRUM SILVER PO) Take 1 tablet by mouth daily.        . simvastatin (ZOCOR) 20 MG tablet Take 1 tablet (20 mg total) by mouth at bedtime.  30 tablet  11  . traMADol (ULTRAM) 50 MG tablet Take 1 tablet (50 mg total) by mouth 3 (three) times daily as needed.  90 tablet  5  . vitamin E 400 UNIT capsule Take 400 Units by mouth daily.       Current Facility-Administered Medications on File Prior to Visit  Medication Dose Route Frequency Provider Last Rate Last Dose  . 0.9 %  sodium chloride infusion  500 mL Intravenous Continuous Irene Shipper, MD

## 2014-04-03 DIAGNOSIS — C73 Malignant neoplasm of thyroid gland: Secondary | ICD-10-CM | POA: Diagnosis not present

## 2014-04-05 DIAGNOSIS — C73 Malignant neoplasm of thyroid gland: Secondary | ICD-10-CM | POA: Diagnosis not present

## 2014-04-05 DIAGNOSIS — E890 Postprocedural hypothyroidism: Secondary | ICD-10-CM | POA: Diagnosis not present

## 2014-04-10 DIAGNOSIS — C44211 Basal cell carcinoma of skin of unspecified ear and external auricular canal: Secondary | ICD-10-CM | POA: Diagnosis not present

## 2014-05-23 ENCOUNTER — Encounter: Payer: Self-pay | Admitting: Pulmonary Disease

## 2014-05-24 ENCOUNTER — Encounter: Payer: Self-pay | Admitting: Pulmonary Disease

## 2014-05-24 ENCOUNTER — Ambulatory Visit (INDEPENDENT_AMBULATORY_CARE_PROVIDER_SITE_OTHER): Payer: Medicare Other | Admitting: Pulmonary Disease

## 2014-05-24 VITALS — BP 130/70 | HR 66 | Temp 97.9°F | Ht 61.0 in | Wt 126.2 lb

## 2014-05-24 DIAGNOSIS — E032 Hypothyroidism due to medicaments and other exogenous substances: Secondary | ICD-10-CM

## 2014-05-24 DIAGNOSIS — I679 Cerebrovascular disease, unspecified: Secondary | ICD-10-CM

## 2014-05-24 DIAGNOSIS — M899 Disorder of bone, unspecified: Secondary | ICD-10-CM

## 2014-05-24 DIAGNOSIS — D126 Benign neoplasm of colon, unspecified: Secondary | ICD-10-CM

## 2014-05-24 DIAGNOSIS — C73 Malignant neoplasm of thyroid gland: Secondary | ICD-10-CM | POA: Diagnosis not present

## 2014-05-24 DIAGNOSIS — M25511 Pain in right shoulder: Secondary | ICD-10-CM | POA: Insufficient documentation

## 2014-05-24 DIAGNOSIS — I10 Essential (primary) hypertension: Secondary | ICD-10-CM

## 2014-05-24 DIAGNOSIS — M545 Low back pain, unspecified: Secondary | ICD-10-CM

## 2014-05-24 DIAGNOSIS — Z23 Encounter for immunization: Secondary | ICD-10-CM

## 2014-05-24 DIAGNOSIS — E785 Hyperlipidemia, unspecified: Secondary | ICD-10-CM

## 2014-05-24 DIAGNOSIS — M949 Disorder of cartilage, unspecified: Secondary | ICD-10-CM

## 2014-05-24 MED ORDER — AMLODIPINE BESYLATE 10 MG PO TABS: 10.0000 mg | ORAL_TABLET | Freq: Every day | ORAL | Status: DC

## 2014-05-24 MED ORDER — METOPROLOL SUCCINATE ER 50 MG PO TB24: 50.0000 mg | ORAL_TABLET | Freq: Every day | ORAL | Status: DC

## 2014-05-24 MED ORDER — SIMVASTATIN 20 MG PO TABS: 20.0000 mg | ORAL_TABLET | Freq: Every day | ORAL | Status: DC

## 2014-05-24 MED ORDER — ASPIRIN-DIPYRIDAMOLE ER 25-200 MG PO CP12: ORAL_CAPSULE | ORAL | Status: DC

## 2014-05-24 MED ORDER — TRAMADOL HCL 50 MG PO TABS: 50.0000 mg | ORAL_TABLET | Freq: Three times a day (TID) | ORAL | Status: DC | PRN

## 2014-05-24 MED ORDER — FUROSEMIDE 20 MG PO TABS
ORAL_TABLET | ORAL | Status: DC
Start: 2014-05-24 — End: 2015-03-05

## 2014-05-24 NOTE — Patient Instructions (Signed)
Today we updated your med list in our EPIC system...    Continue your current medications the same...    We refilled your meds per request...  Today we gave you the 2nd PNEUMONIA vaccine- PREVNAR-13...    This is the last pneumonia shot you will need under the current guidelines...  Call for any questions...  Let's plan a follow up visit in 33mo with FASTING blood work at that time.Marland KitchenMarland Kitchen

## 2014-05-26 ENCOUNTER — Encounter: Payer: Self-pay | Admitting: Pulmonary Disease

## 2014-05-26 NOTE — Progress Notes (Signed)
Subjective:    Patient ID: [name], female    DOB: 04-27-1941, 73 y.o.   MRN: [id number]  HPI 73 y/o WF here for a follow up visit... she has mult med problems as noted below>  ~  November 03, 2012:  23mo ROV & [name] reports that she is just getting over shingles outbreak treated by her Ortho- DrHilts (Valtrex, Pred, Vicodin); also has a skin cancer in her left external ear canal that Derm is preparing to remove soon...  We reviewed the following medical problems during today's office visit>>     AR> on Zyrtek, Mucinex; stable on prn med rx...    HBP, MVP> on Metop50, Amlod10, Lasix20-1/2daily; BP= 138/76 & she denies CP, palpit, dizzy, SOB, edema, etc...    Cerebrovasc dis & TIA> on Aggrenox25-200Bid; she remains asymptomatic w/o cerebral ischemic symptoms...    CHOL> on Simva20; FLP shows TChol 170, TG 89, HDL 52, LDL 100; continue same Rx & diet efforts...    Thyroid cancer & hypothy> on Synthroid100; followed by DrKerr Q52mo- seen 11/13 & doing well...    GI- colon polyps> followed by DrPerry w/ last colon 12/12 showing 2 sm adenomatous polyps removed & f/u planned 63yrs...    LBP> off Vicodin & using OTC pain meds prn; she had eval DrDuda & ESI from [facility] in 2009; doing well w/o signif recurrent pain...    Osteopenia> on calcium, MVI, VitD; prev on Fosamax but stopped by GYN ?2008 & they follow her BMDs...    Skin Cancer> new- in left ext ear canal w/ surg planned by Derm 1/14...    Shingles> she had the shingles vaccine prev; developed rt leg rash/pain & eval by Ortho- DrHilts 12/13- treated w/ Valtrex, Pred, Vicodin & resolved... We reviewed prob list, meds, xrays and labs> see below for updates >> she had the flu vaccine 9/13 & requests refill prescriptions today...  CXR 1/14 showed norm heart size, clear lungs-wnl, clips in lower neck from thyroid surg...  LABS 1/14: FLP- at goals on Simva20;  Chems- wnl;  CBC- wnl;  VitD= 69;  Thyroid per DrKerr...  ~  May 11, 2013:  51mo ROV  & [name] is doing quite well- no new complaints or concerns;  She had a recent Basal cell ca removed from her left ear 7 is sched for Moh's at the Skin surg center soon...     BP is controlled on MetopER50-1/2 tab, Amlod10, and Lasix20-1/2tab daily; BP= 130/70 & she denies HA, dizzy, CP, palpit, SOB, edema, etc...    Chol has been well managed w/ diet + Simva20 w/ FLP 1/14 showing all parameters wnl...    She remains on Aggrenox Bid & denies all cerebral ischemic symptoms...     She continues to f/u w/ DrKerr for her Hx thyroid cancer, s/p total thyroidectomy in 2010; seen 6/14 & stable on Synthroid167mcg/d (labs reviewed)... We reviewed prob list, meds, xrays and labs> see below for updates >>   ~  November 22, 2013:  24mo ROV & [name] is c/o incr pain in right shoulder- we discussed getting an XRay & referral to Ortho, DrDuda... She had epid steroid injection in her back 8/14 w/ some relief...     Allergies controlled w/ Zyrtek & Mucinex...    BP is regulated w/ MetopER50, Amlod10, Lasix20-1/2Qam; BP= 110/68 & she denies CP, palpit, SOB, edema, etc...    She remains on AggrenoxBid & denies cerebral ischemic symptoms...    FLP looks good on Simva20 w/  TChol 171, TG 114, HDL 56, LDL 92     She continues to f/u w/ DrKerr (seen 6/14) for her Hx thyroid cancer, s/p total thyroidectomy in 2010; seen 6/14 & stable on Synthroid157mcg/d We reviewed prob list, meds, xrays and labs> see below for updates >> she had the 2014 Flu vaccine 9/14...  Right shoulder film> Prominent ossified density in the soft tissues anterior to the right humerus, could be related to severe calcific subscapularis tendinitis and/or prior tear, could be related to a prominent loose body. MRI recommended & we will refer to Ortho...  LABS 1/15:  FLP- at goals on Simva20;  Chems- wnl;  CBC- wnl;  TSH=0.42 on Levo100...   ~  May 24, 2014:  73mo ROV & [name] is doing well overall- DrDuda gave her 2 shots in her right shoulder (Feb &  Mar)- now much improved...    BP well controlled on Metop50, Amlod10, Lasix20-1/2 daily; BP= 130/70 & she denies CP, palpit, SOB, edema, or cerebral ischemic symptoms...    She had Endocrine f/u DrKerr 5/15> hx thyroid Ca, he does labs & pt reports that everything was fine    She has Gyn f/u due soon for PAP, she reports recent BMD & mammorgram- OK by her hx; BMD from [facility] 2/15 showed Tscore -1.9 in left FemNeck...    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 We reviewed prob list, meds, xrays and labs> see below for updates >> Meds refilled & we gave her a Prevnar-13 shot today...         Problem List:  ALLERGIC RHINITIS (ICD-477.9) - we discussed Rx w/ Zyrtek, Saline, Mucinex OTC...  HYPERTENSION (ICD-401.9) - on METOPROLOL XL 50mg /d, NORVASC 10mg /d, & LASIX 20mg - 1/2 tab daily...   ~  CXR 12/10 showed mild cardiomeg, clear lungs, NAD... ~  10/11:  BP=134/86, doing well w/ med + home BP checks... denies HA, fatigue, visual changes, CP, palipit, dizziness, syncope, dyspnea, edema, etc... ~  4/12:  BP= 120/76 & she continues to remain asymptomatic... ~  11/12:  BP= 122/72 & she continues stable w/o CP, palpit, dizzy, SOB, edema, or cerebral ischemic symptoms on her Aggrenox Bid. ~  5/13:  BP= 138/72 & she denies CP, palpit, SOB, edema, etc... ~  1/14: on Metop50, Amlod10, Lasix20-1/2daily; BP= 138/76 & she denies CP, palpit, dizzy, SOB, edema, etc... ~  CXR 1/14 showed normal heart size, clear lungs, surg clips in low neck, NAD.[name]. ~  7/14: BP is controlled on MetopER50-1/2 tab, Amlod10, and Lasix20-1/2tab daily; BP= 130/70 & she denies HA, dizzy, CP, palpit, SOB, edema, etc. ~  1/15: BP is regulated w/ MetopER50, Amlod10, Lasix20-1/2Qam; BP= 110/68 & she denies CP, palpit, SOB, edema, etc. ~  7/15: BP well controlled on Metop50, Amlod10, Lasix20-1/2 daily; BP= 130/70 & she denies CP, palpit, SOB, edema, or cerebral ischemic symptoms.  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 no recent symptoms of CP or palpit... can't find prev 2DEcho...  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 CDoppler's w/ non-obstructive plaque right carotid bulb, no signif ICA stenoses... ~  f/u CDoppler 5/10 showed mild plaque bilat, 0-39% blat ICA stenoses... f/u 5yr. ~  f/u CDoppler 5/11 showed mild plaque bilat, 0-39% bilat ICA stenoses, no change, f/u 35yr. ~  f/u CDoppler 4/12 showed mild plaque in bulbs, 0-39% bilat ICA stenoses, stable,  repeat rec in 60yrs. ~  f/u CDopplers 4/14 showed mild heterogeneous plaque bilat, stable 0-39% bilat ICA stenoses, f/u 2 yrs...  HYPERLIPIDEMIA (ICD-272.4) - on SIMVASTATIN 20mg /d... she tried off meds 3/09 due to leg pains- but further eval showed HNP...  ~  pre-treatment FLP 3/04 showed TChol 282, TG 210, HDL 56, LDL 184... statin Rx started... ~  f/u FLP's on Statin Rx were good:  TChol 150-166, TG 87-119, HDL 35-49, LDL 86-107... ~  [place] 10/08 on Zocor20/d showed TChol 174, TG 136, HDL 47, LDL 99... ~  [place] 4/09 off Zocor showed TChol 285, TG 192, HDL 56, LDL 200... rec- restart Zocor20. ~  [place] 11/09 on Simva20 showed TChol 189, Tg 142, HDL 46, LDL 115... rec> continue same. ~  FLP 4/10 on Simva20 showed TChol 193, TG 114, HDL 55, LDL 116... may need incr dose. 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  ~  Endocrine eval by DrKerr 9/10 w/ radioactive iodine therapy- 106 mCi I- 131 given and scan showed only uptake in neck... ~  labs 4/10 showed TSH = 1.30 ~  9/10:  started on thyroid replacement by DrKerr- currently Synthroid 112 micrograms/d. ~  12/10: Synthroid decreased to 148mcg/d due to ?elevated BP?... labs followed by DrKerr. ~  9/11:  f/u DrKerr doing well- labs on SYNTHROID 133mcg/d= OK, and I-131 whole body scan reported neg. ~  4/12:  She continues regular f/u DrKerr> TSH= 0.36, Thyroid Ultrasound w/o recurrent or resid thyroid tissue seen... ~  4/13:  F/u DrKerr 4/13 w/ hx papillary thyroid cancer> s/p thyroidectomy & RAI therapy; on Levothy173mcg/d; ThySonar 4/13> no resid thyroid tissue or mass; "Everything was fine, no sign of cancer coming back" she says. ~  11/13: she saw DrKerr for 76mo ROV> papillary thyr ca, follicular variant- post surg hypothyroidism on Synthroid100; TSH=0.52; Throglob level remains undetectable... ~  Thyroid Ultrasound 6/14 showed no resid thyroid tissue & no adenopathy... ~  She continues regular Q73mo follow up visits w/ DrKerr...  COLONIC POLYPS (ICD-211.3) - colonoscopy 8/09 by DrPerry w/ several adenomatous polyps removed... f/u planned 5 yrs. ~  She saw DrPerry 12/12 w/ colonoscopy done> 2 polyps in asc colon= tubular adenomas & f/u suggested in 11yrs...  BACK PAIN, LUMBAR (ICD-724.2) - eval DrDuda 4/09 w/ HNP L5-S1 and treated w/ epid steroid shot by DrNewton and improved...  OSTEOPENIA (ICD-733.90) - prev on Fosamax but this was stopped by GYN after last BMD (2008) at [facility] showed improved BMD... she takes Caltrate, Vits, Vit D... ~  labs 4/09 showed Vit D level = 33 therefore started Vit D OTC 11-1998 daly. ~  labs 4/10 showed Vit D level = 59 on 1000 u  daily. ~  pt reports that f/u BMD 9/10 was "excellent"... ~  labs 4/11 showed Vit d level = 39... rec> continue 1000 u daily. 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 stable on AGGRENOX Bid... ~  4/12:  She continues w/o cerebral ischemic symptoms...  Skin Cancer - BCE removed from left ankle by DrNolan 8/10... Another removed from nose & followed by Moh's surg... ~  She has had several Basal cells removed w/ subseq Moh's surg at the Skin cancer center...  Hx Shingles >> she's had the Shingles vaccine... ~  5/13:  She reports sudden right hip pain w/ eval DrHilts- Shingles Rx w/ Valtrex & Vicodin by him... ~  12/13: she had an outbreak on right leg w/ pain; went to her Ortho- DrHilts & treated w/ Valtrex, Pred, Vicodin and resolved...  Health Maintenance:   ~  last colonoscopy 8/09 by DrPerry> see above. ~  GYN= DrRichardson... pt states she is seen every other yr & Mammogram/ BMD at [facility]. ~  Immunizations:  Tetanus/ TDAP updated 2010;  Pneumovax in 2006 at age 34;  Given Prevnar-13 7/15;  she gets the Flu shots yearly...   Past Surgical History  Procedure Laterality Date  . Dilation and curettage of uterus    . Total thyroidectomy    . Polypectomy    . Colonoscopy    . Basal cell carcinoma removed from inside the left ear  11/04/2012    june 2015    Outpatient Encounter Prescriptions as of 05/24/2014  Medication Sig  . amLODipine (NORVASC) 10 MG tablet Take 1 tablet (10 mg total) by mouth daily.  . Calcium Carbonate-Vitamin D (CALCIUM 600+D) 600-400 MG-UNIT per tablet Take 1 tablet by mouth daily.   . cetirizine (ZYRTEC) 5 MG tablet Take 5 mg by mouth as needed.    . cholecalciferol (VITAMIN D) 1000 UNITS tablet Take 1,000 Units by mouth daily.  [name]  dextromethorphan-guaiFENesin (MUCINEX DM) 30-600 MG per 12 hr tablet Take 1 tablet by mouth as needed.   . dipyridamole-aspirin (AGGRENOX) 200-25 MG per 12 hr capsule TAKE ONE CAPSULE BY MOUTH TWICE A DAY  . furosemide (LASIX) 20 MG tablet Take 1/2 daily as needed for swelling  . levothyroxine (SYNTHROID, LEVOTHROID) 100 MCG tablet Take 100 mcg by mouth daily.    . metoprolol succinate (TOPROL-XL) 50 MG 24 hr tablet Take 1 tablet (50 mg total) by mouth daily. Take with or immediately following a meal.  . Multiple Vitamins-Minerals (CENTRUM SILVER PO) Take 1 tablet by mouth daily.    . simvastatin (ZOCOR) 20 MG tablet Take 1 tablet (20 mg total) by mouth at bedtime.  . traMADol (ULTRAM) 50 MG tablet Take 1 tablet (50 mg total) by mouth 3 (three) times daily as needed.  . vitamin E 400 UNIT capsule Take 400 Units by mouth daily.  . [DISCONTINUED] amLODipine (NORVASC) 10 MG tablet Take 1 tablet (10 mg total) by mouth daily.  . [DISCONTINUED] dipyridamole-aspirin (AGGRENOX) 200-25 MG per 12 hr capsule TAKE ONE CAPSULE BY MOUTH TWICE A DAY  . [DISCONTINUED] furosemide (LASIX) 20 MG tablet Take 1/2 daily as needed for swelling  . [DISCONTINUED] metoprolol succinate (TOPROL-XL) 50 MG 24 hr tablet Take 1 tablet (50 mg total) by  mouth daily. Take with or immediately following a meal.  . [DISCONTINUED] simvastatin (ZOCOR) 20 MG tablet Take 1 tablet (20 mg total) by mouth at bedtime.  . [DISCONTINUED] traMADol (ULTRAM) 50 MG tablet Take 1 tablet (50 mg total) by mouth 3 (three) times daily as needed.  . [DISCONTINUED] azithromycin (ZITHROMAX) 250 MG tablet Take as directed    Allergies  Allergen Reactions  . Sulfonamide Derivatives     REACTION: rash    Current Medications, Allergies, Past Medical History, Past Surgical History, Family History, and Social History were reviewed in [employer] record.    Review of Systems        See HPI - all other systems neg except as  noted... The patient denies anorexia, fever, weight loss, weight gain, vision loss, decreased hearing, hoarseness, chest pain, syncope, dyspnea on exertion, peripheral edema, prolonged cough, headaches, hemoptysis, abdominal pain, melena, hematochezia, severe indigestion/heartburn, hematuria, incontinence, muscle weakness, suspicious skin lesions, transient blindness, difficulty walking, depression, unusual weight change, abnormal bleeding, enlarged lymph nodes, and angioedema.     Objective:   Physical Exam     WD, WN, 73 y/o WF in NAD... GENERAL:  Alert & oriented; pleasant & cooperative... HEENT:  [place]/AT, EOM-wnl, PERRLA, pterygium left eye, EACs-clear, TMs-wnl, NOSE-clear, THROAT-clear & wnl. NECK:  Supple w/ fairROM; no JVD; normal carotid impulses w/o bruits; scar of prev thyroid surg, no nodules, no adenopathy. CHEST:  Clear to P & A; without wheezes/ rales/ or rhonchi. HEART:  Regular Rhythm; without murmurs/ rubs/ or gallops. ABDOMEN:  Soft & nontender; normal bowel sounds; no organomegaly or masses detected. EXT: without deformities or arthritic changes; no varicose veins/ venous insuffic/ or edema. NEURO:  CN's intact; motor testing normal; sensory testing normal; gait normal & balance OK. DERM:  s/p surg behind left pinna  RADIOLOGY DATA:  Reviewed in the EPIC EMR & discussed w/ the patient...  LABORATORY DATA:  Reviewed in the EPIC EMR & discussed w/ the patient...   Assessment & Plan:    HBP>  Controlled on meds, tol well, asymptomatic, continue same...  Cerebrovasc Dis & hx TIA>  She remains asymptomatic w/o cerebral ischemic symptoms, continue Agrennox...  HYPERLIPID>  Stable on diet + Simva20...  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  GI> Hx polyps>  She is up to date and had f/u colonoscopy 12/12 as above...  Osteopenia>  Managed by GYN DrRichardson & pt reports that her last BMD was  "excellent"...  Skin Cancer>  She reports Moh's surg for BCE on nose, plus several "keratoses"; new lesion inside left pinna w/ surg pending...  Other medical issues as noted...   Patient's Medications  New Prescriptions   No medications on file  Previous Medications   CALCIUM CARBONATE-VITAMIN D (CALCIUM 600+D) 600-400 MG-UNIT PER TABLET    Take 1 tablet by mouth daily.    CETIRIZINE (ZYRTEC) 5 MG TABLET    Take 5 mg by mouth as needed.     CHOLECALCIFEROL (VITAMIN D) 1000 UNITS TABLET    Take 1,000 Units by mouth daily.   DEXTROMETHORPHAN-GUAIFENESIN (MUCINEX DM) 30-600 MG PER 12 HR TABLET    Take 1 tablet by mouth as needed.    LEVOTHYROXINE (SYNTHROID, LEVOTHROID) 100 MCG TABLET    Take 100 mcg by mouth daily.     MULTIPLE VITAMINS-MINERALS (CENTRUM SILVER PO)    Take 1 tablet by mouth daily.     VITAMIN  E 400 UNIT CAPSULE    Take 400 Units by mouth daily.  Modified Medications   Modified Medication Previous Medication   AMLODIPINE (NORVASC) 10 MG TABLET amLODipine (NORVASC) 10 MG tablet      Take 1 tablet (10 mg total) by mouth daily.    Take 1 tablet (10 mg total) by mouth daily.   DIPYRIDAMOLE-ASPIRIN (AGGRENOX) 200-25 MG PER 12 HR CAPSULE dipyridamole-aspirin (AGGRENOX) 200-25 MG per 12 hr capsule      TAKE ONE CAPSULE BY MOUTH TWICE A DAY    TAKE ONE CAPSULE BY MOUTH TWICE A DAY   FUROSEMIDE (LASIX) 20 MG TABLET furosemide (LASIX) 20 MG tablet      Take 1/2 daily as needed for swelling    Take 1/2 daily as needed for swelling   METOPROLOL SUCCINATE (TOPROL-XL) 50 MG 24 HR TABLET metoprolol succinate (TOPROL-XL) 50 MG 24 hr tablet      Take 1 tablet (50 mg total) by mouth daily. Take with or immediately following a meal.    Take 1 tablet (50 mg total) by mouth daily. Take with or immediately following a meal.   SIMVASTATIN (ZOCOR) 20 MG TABLET simvastatin (ZOCOR) 20 MG tablet      Take 1 tablet (20 mg total) by mouth at bedtime.    Take 1 tablet (20 mg total) by mouth at bedtime.    TRAMADOL (ULTRAM) 50 MG TABLET traMADol (ULTRAM) 50 MG tablet      Take 1 tablet (50 mg total) by mouth 3 (three) times daily as needed.    Take 1 tablet (50 mg total) by mouth 3 (three) times daily as needed.  Discontinued Medications   AZITHROMYCIN (ZITHROMAX) 250 MG TABLET    Take as directed

## 2014-05-28 DIAGNOSIS — D485 Neoplasm of uncertain behavior of skin: Secondary | ICD-10-CM | POA: Diagnosis not present

## 2014-05-28 DIAGNOSIS — L820 Inflamed seborrheic keratosis: Secondary | ICD-10-CM | POA: Diagnosis not present

## 2014-05-28 DIAGNOSIS — Z85828 Personal history of other malignant neoplasm of skin: Secondary | ICD-10-CM | POA: Diagnosis not present

## 2014-07-02 DIAGNOSIS — H2510 Age-related nuclear cataract, unspecified eye: Secondary | ICD-10-CM | POA: Diagnosis not present

## 2014-07-02 DIAGNOSIS — H11049 Peripheral pterygium, stationary, unspecified eye: Secondary | ICD-10-CM | POA: Diagnosis not present

## 2014-07-26 DIAGNOSIS — H2510 Age-related nuclear cataract, unspecified eye: Secondary | ICD-10-CM | POA: Diagnosis not present

## 2014-07-26 DIAGNOSIS — H11059 Peripheral pterygium, progressive, unspecified eye: Secondary | ICD-10-CM | POA: Diagnosis not present

## 2014-07-30 ENCOUNTER — Ambulatory Visit (INDEPENDENT_AMBULATORY_CARE_PROVIDER_SITE_OTHER): Payer: Medicare Other

## 2014-07-30 DIAGNOSIS — Z23 Encounter for immunization: Secondary | ICD-10-CM

## 2014-08-09 DIAGNOSIS — Z0142 Encounter for cervical smear to confirm findings of recent normal smear following initial abnormal smear: Secondary | ICD-10-CM | POA: Diagnosis not present

## 2014-08-09 DIAGNOSIS — Z124 Encounter for screening for malignant neoplasm of cervix: Secondary | ICD-10-CM | POA: Diagnosis not present

## 2014-08-09 DIAGNOSIS — Z01419 Encounter for gynecological examination (general) (routine) without abnormal findings: Secondary | ICD-10-CM | POA: Diagnosis not present

## 2014-08-09 DIAGNOSIS — Z1272 Encounter for screening for malignant neoplasm of vagina: Secondary | ICD-10-CM | POA: Diagnosis not present

## 2014-08-10 ENCOUNTER — Telehealth: Payer: Self-pay | Admitting: Pulmonary Disease

## 2014-08-10 NOTE — Telephone Encounter (Signed)
Find out what kind of eye surgery ?  Find out and will let her know on [facility]

## 2014-08-10 NOTE — Telephone Encounter (Signed)
Called  And spoke with pt and she stated that she is having eye surgery on oct 21.  She is on the aggrenox now and wanted to know if she needs to come off of this prior to her surgery.  SN is out of the office until Tuesday.  She stated that they will be going out of town on Tuesday and will be gone all week.  Will forward to TP for recs.  Thanks  Allergies  Allergen Reactions  . Sulfonamide Derivatives     REACTION: rash    Current Outpatient Prescriptions on File Prior to Visit  Medication Sig Dispense Refill  . amLODipine (NORVASC) 10 MG tablet Take 1 tablet (10 mg total) by mouth daily.  30 tablet  11  . Calcium Carbonate-Vitamin D (CALCIUM 600+D) 600-400 MG-UNIT per tablet Take 1 tablet by mouth daily.       . cetirizine (ZYRTEC) 5 MG tablet Take 5 mg by mouth as needed.        . cholecalciferol (VITAMIN D) 1000 UNITS tablet Take 1,000 Units by mouth daily.      [name] dextromethorphan-guaiFENesin (MUCINEX DM) 30-600 MG per 12 hr tablet Take 1 tablet by mouth as needed.       . dipyridamole-aspirin (AGGRENOX) 200-25 MG per 12 hr capsule TAKE ONE CAPSULE BY MOUTH TWICE A DAY  60 capsule  11  . furosemide (LASIX) 20 MG tablet Take 1/2 daily as needed for swelling  30 tablet  11  . levothyroxine (SYNTHROID, LEVOTHROID) 100 MCG tablet Take 100 mcg by mouth daily.        . metoprolol succinate (TOPROL-XL) 50 MG 24 hr tablet Take 1 tablet (50 mg total) by mouth daily. Take with or immediately following a meal.  30 tablet  11  . Multiple Vitamins-Minerals (CENTRUM SILVER PO) Take 1 tablet by mouth daily.        . simvastatin (ZOCOR) 20 MG tablet Take 1 tablet (20 mg total) by mouth at bedtime.  30 tablet  11  . traMADol (ULTRAM) 50 MG tablet Take 1 tablet (50 mg total) by mouth 3 (three) times daily as needed.  90 tablet  5  . vitamin E 400 UNIT capsule Take 400 Units by mouth daily.       Current Facility-Administered Medications on File Prior to Visit  Medication Dose Route Frequency Provider  Last Rate Last Dose  . 0.9 %  sodium chloride infusion  500 mL Intravenous Continuous Irene Shipper, MD

## 2014-08-10 NOTE — Telephone Encounter (Signed)
Spoke with pt, she did not know the name of the procedure she was having, but said that she had a tissue growth on her  L cornea, and Dr. Gershon Crane would be removing the growth and grafting skin from her eyelid to cover where the growth was removed.

## 2014-08-14 NOTE — Telephone Encounter (Signed)
Per SN---  Need to see if Dr. Gershon Crane wants her off of the aggrenox.

## 2014-08-14 NOTE — Telephone Encounter (Signed)
Dr. Lenna Gilford  Is back can you ask him please

## 2014-08-15 ENCOUNTER — Telehealth: Payer: Self-pay | Admitting: Neurology

## 2014-08-15 NOTE — Telephone Encounter (Signed)
Miquel Dunn from [facility] calling to state that patient is going to have procedure with Dr. Gershon Crane, she will be having a growth removed from her cornea, wants to know if patient needs to come off of Aggrenox before procedure. Please return call and advise.

## 2014-08-15 NOTE — Telephone Encounter (Signed)
[name] with Dr. Gershon Crane office called & states that Dr Gevena Cotton w/ [facility], ph#  [phone], put pt on the aggrenox.  You will need to check with Dr. Frederico Hamman regarding this medication.  [name]

## 2014-08-15 NOTE — Telephone Encounter (Signed)
See phone note

## 2014-08-15 NOTE — Telephone Encounter (Signed)
Spoke with pt-- aware that we need to contact whoever prescribed this initially Per patient Dr Leonie Man is the Dr that originally rxd this medication, pt requests that we call to see if okay to stop Aggrenox.  Called Dr Leonie Man @ [phone] & LM with receptionist to have nurse call back .

## 2014-08-16 NOTE — Telephone Encounter (Signed)
Ok to hold aggrenox 3 days prior to procedure and restart after when safer with a small but acceptable risk of periprocedure stroke.

## 2014-08-16 NOTE — Telephone Encounter (Signed)
Spoke with receptionist with Dr. Clydene Fake office.  Nurse will call office back.

## 2014-08-16 NOTE — Telephone Encounter (Signed)
[name] from [facility] calling again about patient's Aggrenox, states that she really needs to hear back today, please return call and advise.

## 2014-08-17 NOTE — Telephone Encounter (Signed)
[name] from [facility] called back and I informed her per Dr. Leonie Man that it was ok to hold aggrenox 3 days prior to procedure and restart after when safer with a small but acceptable risk of periprocedure stroke. I advised leslie to give our office a call back if they have any other problems, questions or concerns. [name] verbalized unerstanding

## 2014-08-17 NOTE — Telephone Encounter (Signed)
Spoke with the [place]  She states that Dr Leonie Man says to hold Aggrenox 3 days prior to procedure and then resume med after surgery  I called and informed the pt and she verbalized understanding

## 2014-08-17 NOTE — Telephone Encounter (Signed)
Called [facility] to speak with Crystal, she was not available, left message for her to return call concerning the pt's procedure clearance.

## 2014-08-17 NOTE — Telephone Encounter (Signed)
Tye Maryland w/ [facility] returned call & can be reached at [phone].  [name]

## 2014-08-22 ENCOUNTER — Other Ambulatory Visit: Payer: Self-pay | Admitting: Ophthalmology

## 2014-08-22 DIAGNOSIS — H2510 Age-related nuclear cataract, unspecified eye: Secondary | ICD-10-CM | POA: Diagnosis not present

## 2014-08-22 DIAGNOSIS — H11059 Peripheral pterygium, progressive, unspecified eye: Secondary | ICD-10-CM | POA: Diagnosis not present

## 2014-08-22 DIAGNOSIS — H11002 Unspecified pterygium of left eye: Secondary | ICD-10-CM | POA: Diagnosis not present

## 2014-08-22 HISTORY — PX: EYE SURGERY: SHX253

## 2014-10-22 ENCOUNTER — Telehealth: Payer: Self-pay | Admitting: Pulmonary Disease

## 2014-10-22 MED ORDER — AMOXICILLIN-POT CLAVULANATE 875-125 MG PO TABS: 1.0000 | ORAL_TABLET | Freq: Two times a day (BID) | ORAL | Status: DC

## 2014-10-22 MED ORDER — PREDNISONE (PAK) 5 MG PO TABS: ORAL_TABLET | ORAL | Status: DC

## 2014-10-22 NOTE — Telephone Encounter (Signed)
rx sent and pt is aware. Jennifer Castillo, CMA  

## 2014-10-22 NOTE — Telephone Encounter (Signed)
Spoke with pt, c/o prod cough with green mucus, sinus congestion, PND, scratchy throat since Friday.  Pt is requesting recs.  Meds need to be sent to [facility].    Last ov:05/24/14 Next ov: 11/28/14  Dr. Lenna Gilford please advise.  Thanks!  Allergies  Allergen Reactions  . Sulfonamide Derivatives     REACTION: rash   Current Outpatient Prescriptions on File Prior to Visit  Medication Sig Dispense Refill  . amLODipine (NORVASC) 10 MG tablet Take 1 tablet (10 mg total) by mouth daily. 30 tablet 11  . Calcium Carbonate-Vitamin D (CALCIUM 600+D) 600-400 MG-UNIT per tablet Take 1 tablet by mouth daily.     . cetirizine (ZYRTEC) 5 MG tablet Take 5 mg by mouth as needed.      . cholecalciferol (VITAMIN D) 1000 UNITS tablet Take 1,000 Units by mouth daily.    [name] dextromethorphan-guaiFENesin (MUCINEX DM) 30-600 MG per 12 hr tablet Take 1 tablet by mouth as needed.     . dipyridamole-aspirin (AGGRENOX) 200-25 MG per 12 hr capsule TAKE ONE CAPSULE BY MOUTH TWICE A DAY 60 capsule 11  . furosemide (LASIX) 20 MG tablet Take 1/2 daily as needed for swelling 30 tablet 11  . levothyroxine (SYNTHROID, LEVOTHROID) 100 MCG tablet Take 100 mcg by mouth daily.      . metoprolol succinate (TOPROL-XL) 50 MG 24 hr tablet Take 1 tablet (50 mg total) by mouth daily. Take with or immediately following a meal. 30 tablet 11  . Multiple Vitamins-Minerals (CENTRUM SILVER PO) Take 1 tablet by mouth daily.      . simvastatin (ZOCOR) 20 MG tablet Take 1 tablet (20 mg total) by mouth at bedtime. 30 tablet 11  . traMADol (ULTRAM) 50 MG tablet Take 1 tablet (50 mg total) by mouth 3 (three) times daily as needed. 90 tablet 5  . vitamin E 400 UNIT capsule Take 400 Units by mouth daily.     Current Facility-Administered Medications on File Prior to Visit  Medication Dose Route Frequency Provider Last Rate Last Dose  . 0.9 %  sodium chloride infusion  500 mL Intravenous Continuous Irene Shipper, MD

## 2014-10-22 NOTE — Telephone Encounter (Signed)
Per SN----  augmentin 875 mg  #14  1 po bid until gone Prednisone dosepak #1  Take as directed. Please give a 6 day pack.  thanks

## 2014-11-26 DIAGNOSIS — D485 Neoplasm of uncertain behavior of skin: Secondary | ICD-10-CM | POA: Diagnosis not present

## 2014-11-26 DIAGNOSIS — Z85828 Personal history of other malignant neoplasm of skin: Secondary | ICD-10-CM | POA: Diagnosis not present

## 2014-11-26 DIAGNOSIS — L820 Inflamed seborrheic keratosis: Secondary | ICD-10-CM | POA: Diagnosis not present

## 2014-11-26 DIAGNOSIS — L821 Other seborrheic keratosis: Secondary | ICD-10-CM | POA: Diagnosis not present

## 2014-11-26 DIAGNOSIS — Z08 Encounter for follow-up examination after completed treatment for malignant neoplasm: Secondary | ICD-10-CM | POA: Diagnosis not present

## 2014-11-28 ENCOUNTER — Ambulatory Visit (INDEPENDENT_AMBULATORY_CARE_PROVIDER_SITE_OTHER): Payer: Medicare Other | Admitting: Pulmonary Disease

## 2014-11-28 ENCOUNTER — Encounter (INDEPENDENT_AMBULATORY_CARE_PROVIDER_SITE_OTHER): Payer: Self-pay

## 2014-11-28 ENCOUNTER — Ambulatory Visit (INDEPENDENT_AMBULATORY_CARE_PROVIDER_SITE_OTHER)
Admission: RE | Admit: 2014-11-28 | Discharge: 2014-11-28 | Disposition: A | Discharge status: Home / Self Care | Payer: Medicare Other | Source: Ambulatory Visit | Attending: Pulmonary Disease | Admitting: Pulmonary Disease

## 2014-11-28 ENCOUNTER — Encounter: Payer: Self-pay | Admitting: Pulmonary Disease

## 2014-11-28 ENCOUNTER — Other Ambulatory Visit (INDEPENDENT_AMBULATORY_CARE_PROVIDER_SITE_OTHER): Payer: Medicare Other

## 2014-11-28 VITALS — BP 128/78 | HR 70 | Temp 97.2°F | Ht 61.0 in | Wt 127.0 lb

## 2014-11-28 DIAGNOSIS — C4490 Unspecified malignant neoplasm of skin, unspecified: Secondary | ICD-10-CM

## 2014-11-28 DIAGNOSIS — G458 Other transient cerebral ischemic attacks and related syndromes: Secondary | ICD-10-CM

## 2014-11-28 DIAGNOSIS — F419 Anxiety disorder, unspecified: Secondary | ICD-10-CM | POA: Diagnosis not present

## 2014-11-28 DIAGNOSIS — M545 Low back pain, unspecified: Secondary | ICD-10-CM

## 2014-11-28 DIAGNOSIS — I10 Essential (primary) hypertension: Secondary | ICD-10-CM

## 2014-11-28 DIAGNOSIS — D126 Benign neoplasm of colon, unspecified: Secondary | ICD-10-CM | POA: Diagnosis not present

## 2014-11-28 DIAGNOSIS — C73 Malignant neoplasm of thyroid gland: Secondary | ICD-10-CM

## 2014-11-28 DIAGNOSIS — E559 Vitamin D deficiency, unspecified: Secondary | ICD-10-CM

## 2014-11-28 DIAGNOSIS — I679 Cerebrovascular disease, unspecified: Secondary | ICD-10-CM

## 2014-11-28 DIAGNOSIS — E032 Hypothyroidism due to medicaments and other exogenous substances: Secondary | ICD-10-CM | POA: Diagnosis not present

## 2014-11-28 DIAGNOSIS — Z85850 Personal history of malignant neoplasm of thyroid: Secondary | ICD-10-CM | POA: Diagnosis not present

## 2014-11-28 DIAGNOSIS — E785 Hyperlipidemia, unspecified: Secondary | ICD-10-CM

## 2014-11-28 DIAGNOSIS — M949 Disorder of cartilage, unspecified: Secondary | ICD-10-CM

## 2014-11-28 DIAGNOSIS — M899 Disorder of bone, unspecified: Secondary | ICD-10-CM

## 2014-11-28 LAB — BASIC METABOLIC PANEL
BUN: 17 mg/dL (ref 6–23)
CO2: 26 mEq/L (ref 19–32)
Calcium: 9.6 mg/dL (ref 8.4–10.5)
Chloride: 103 mEq/L (ref 96–112)
Creatinine, Ser: 0.65 mg/dL (ref 0.40–1.20)
GFR: 94.49 mL/min (ref >60.00)
Glucose, Bld: 102 mg/dL — ABNORMAL HIGH (ref 70–99)
Potassium: 4.3 mEq/L (ref 3.5–5.1)
SODIUM: 138 meq/L (ref 135–145)

## 2014-11-28 LAB — CBC WITH DIFFERENTIAL/PLATELET
BASOS PCT: 0.6 % (ref 0.0–3.0)
Basophils Absolute: 0 10*3/uL (ref 0.0–0.1)
EOS ABS: 0.2 10*3/uL (ref 0.0–0.7)
Eosinophils Relative: 3.1 % (ref 0.0–5.0)
HCT: 41.7 % (ref 36.0–46.0)
Hemoglobin: 14.3 g/dL (ref 12.0–15.0)
LYMPHS ABS: 1.5 10*3/uL (ref 0.7–4.0)
Lymphocytes Relative: 25.2 % (ref 12.0–46.0)
MCHC: 34.3 g/dL (ref 30.0–36.0)
MCV: 87 fl (ref 78.0–100.0)
MONOS PCT: 7.4 % (ref 3.0–12.0)
Monocytes Absolute: 0.4 10*3/uL (ref 0.1–1.0)
NEUTROS PCT: 63.7 % (ref 43.0–77.0)
Neutro Abs: 3.9 10*3/uL (ref 1.4–7.7)
PLATELETS: 302 10*3/uL (ref 150.0–400.0)
RBC: 4.8 Mil/uL (ref 3.87–5.11)
RDW: 13.6 % (ref 11.5–15.5)
WBC: 6.1 10*3/uL (ref 4.0–10.5)

## 2014-11-28 LAB — LIPID PANEL
CHOL/HDL RATIO: 3
CHOLESTEROL: 173 mg/dL (ref 0–200)
HDL: 51.1 mg/dL (ref >39.00)
LDL Cholesterol: 93 mg/dL (ref 0–99)
NonHDL: 121.9
Triglycerides: 143 mg/dL (ref 0.0–149.0)
VLDL: 28.6 mg/dL (ref 0.0–40.0)

## 2014-11-28 LAB — HEPATIC FUNCTION PANEL
ALT: 20 U/L (ref 0–35)
AST: 20 U/L (ref 0–37)
Albumin: 4.4 g/dL (ref 3.5–5.2)
Alkaline Phosphatase: 54 U/L (ref 39–117)
BILIRUBIN TOTAL: 0.5 mg/dL (ref 0.2–1.2)
Bilirubin, Direct: 0.1 mg/dL (ref 0.0–0.3)
Total Protein: 7.4 g/dL (ref 6.0–8.3)

## 2014-11-28 LAB — TSH: TSH: 0.4 u[IU]/mL (ref 0.35–4.50)

## 2014-11-28 LAB — VITAMIN D 25 HYDROXY (VIT D DEFICIENCY, FRACTURES): VITD: 50.76 ng/mL (ref 30.00–100.00)

## 2014-11-28 MED ORDER — SIMVASTATIN 20 MG PO TABS: 20.0000 mg | ORAL_TABLET | Freq: Every day | ORAL | Status: DC

## 2014-11-28 NOTE — Patient Instructions (Signed)
Today we updated your med list in our EPIC system...    Continue your current medications the same...  Today we did your follow up CXR & FASTING blood work...    We will contact you w/ the results when available...   Keep up the good work w/ diet & exercise...  Call for any questions...  Let's plan a follow up visit in 61mo, sooner if needed for problems.Marland KitchenMarland Kitchen

## 2014-11-28 NOTE — Progress Notes (Addendum)
Subjective:    Patient ID: [name], female    DOB: 04-27-1941, 74 y.o.   MRN: [id number]  HPI 74 y/o WF here for a follow up visit... 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  We reviewed the following medical problems during today's office visit>>     AR> on Zyrtek, Mucinex; stable on prn med rx...    HBP, MVP> on Metop50, Amlod10, Lasix20-1/2daily; BP= 138/76 & she denies CP, palpit, dizzy, SOB, edema, etc...    Cerebrovasc dis & TIA> on Aggrenox25-200Bid; she remains asymptomatic w/o cerebral ischemic symptoms...    CHOL> on Simva20; FLP shows TChol 170, TG 89, HDL 52, LDL 100; continue same Rx & diet efforts...    Thyroid cancer & hypothy> on Synthroid100; followed by [name] Q52mo- seen 11/13 & doing well...    GI- colon polyps> followed by [name] w/ last colon 12/12 showing 2 sm adenomatous polyps removed & f/u planned 63yrs...    LBP> off Vicodin & using OTC pain meds prn; she had eval DrDuda & ESI from [facility] in 2009; doing well w/o signif recurrent pain...    Osteopenia> on calcium, MVI, VitD; prev on Fosamax but stopped by GYN ?2008 & they follow her BMDs...    Skin Cancer> new- in left ext ear canal w/ surg planned by Derm 1/14...    Shingles> she had the shingles vaccine prev; developed rt leg rash/pain & eval by Ortho- [name] 12/13- treated w/ Valtrex, Pred, Vicodin & resolved... We reviewed prob list, meds, xrays and labs> see below for updates >> she had the flu vaccine 9/13 & requests refill prescriptions today...  CXR 1/14 showed norm heart size, clear lungs-wnl, clips in lower neck from thyroid surg...  LABS 1/14: FLP- at goals on Simva20;  Chems- wnl;  CBC- wnl;  VitD= 69;  Thyroid per [name]...  ~  May 11, 2013:  51mo ROV  & [name] is doing quite well- no new complaints or concerns;  She had a recent Basal cell ca removed from her left ear 7 is sched for Moh's at [facility] soon...     BP is controlled on MetopER50-1/2 tab, Amlod10, and Lasix20-1/2tab daily; BP= 130/70 & she denies HA, dizzy, CP, palpit, SOB, edema, etc...    Chol has been well managed w/ diet + Simva20 w/ FLP 1/14 showing all parameters wnl...    She remains on Aggrenox Bid & denies all cerebral ischemic symptoms...     She continues to f/u w/ [name] for her Hx thyroid cancer, s/p total thyroidectomy in 2010; seen 6/14 & stable on Synthroid167mcg/d (labs reviewed)... We reviewed prob list, meds, xrays and labs> see below for updates >>   ~  November 22, 2013:  24mo ROV & Lachina is c/o incr pain in right shoulder- we discussed getting an XRay & referral to Ortho, DrDuda... She had epid steroid injection in her back 8/14 w/ some relief...     Allergies controlled w/ Zyrtek & Mucinex...    BP is regulated w/ MetopER50, Amlod10, Lasix20-1/2Qam; BP= 110/68 & she denies CP, palpit, SOB, edema, etc...    She remains on AggrenoxBid & denies cerebral ischemic symptoms...    FLP looks good on Simva20 w/  TChol 171, TG 114, HDL 56, LDL 92     She continues to f/u w/ [name] (seen 6/14) for her Hx thyroid cancer, s/p total thyroidectomy in 2010; seen 6/14 & stable on Synthroid130mcg/d We reviewed prob list, meds, xrays and labs> see below for updates >> she had the 2014 Flu vaccine 9/14...  Right shoulder film> Prominent ossified density in the soft tissues anterior to the right humerus, could be related to severe calcific subscapularis tendinitis and/or prior tear, could be related to a prominent loose body. MRI recommended & we will refer to Ortho...  LABS 1/15:  FLP- at goals on Simva20;  Chems- wnl;  CBC- wnl;  TSH=0.42 on Levo100...   ~  May 24, 2014:  12mo ROV & [name] is doing well overall- DrDuda gave her 2 shots in her right shoulder (Feb &  Mar)- now much improved...    BP well controlled on Metop50, Amlod10, Lasix20-1/2 daily; BP= 130/70 & she denies CP, palpit, SOB, edema, or cerebral ischemic symptoms...    She had Endocrine f/u [name] 5/15> hx thyroid Ca, he does labs & pt reports that everything was fine    She has Gyn f/u due soon for PAP, she reports recent BMD & mammorgram- OK by her hx; BMD from [facility] 2/15 showed Tscore -1.9 in left FemNeck...    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 We reviewed prob list, meds, xrays and labs> see below for updates >> Meds refilled & we gave her a Prevnar-13 shot today...  ~  November 28, 2014:  22mo ROV & [name] tells me she had left eye surg 10/15 by [name] (?pterigium)) w/ post op hemorrhage prob due to Aggrenox rx;  She also had an URI 12/15 & called in- given Augmentin & Pred=> symptoms resolved... We reviewed the following medical problems during today's office visit >>     AR> on Zyrtek, Mucinex; stable on prn med rx...    HBP, MVP> on Metop50, Amlod10, Lasix20-1/2daily prn; BP= 128/78 & she denies CP, palpit, dizzy, SOB, edema, etc...    Cerebrovasc dis & TIA> on Aggrenox25-200Bid; she remains asymptomatic w/o cerebral ischemic symptoms...    CHOL> on Simva20; FLP 1/16 shows TChol 173, TG 143, HDL 51, LDL 93; continue same Rx & diet efforts...    Thyroid cancer & hypothy> on Synthroid100; followed by [name] Q64mo- seen 6/15 & doing well...    GI- colon polyps> followed by [name] w/ last colon 12/12 showing 2 sm adenomatous polyps removed & f/u planned 25yrs...    LBP> off Vicodin & using Tramadol50 prn; she had eval DrDuda & ESI from [facility] in 2009; doing well w/o signif recurrent pain...    Osteopenia> on calcium, MVI, VitD; prev on Fosamax but stopped by GYN ?2008 & they follow her BMDs...    Skin Cancer> new- in left ext ear canal w/ surg planned by Derm 1/14...    Shingles> she had the shingles vaccine prev; developed rt leg  rash/pain & eval by Ortho- [name] 12/13- treated w/ Valtrex, Pred, Vicodin & resolved... We reviewed prob list, meds, xrays and labs> see below for updates >> she had the 2015 flu vaccine 9/15 7 is up to date on all vaccinations...   CXR 1/16 showed norm heart size, calcif in Ao, clear lungs, NAD...   LABS 1/16:  FLP- at goals on Simva20;  Chems- wnl;  CBC- wnl;  TSH=0.40 on synth100;  VitD=51  on 1000u daily...          Problem List:  ALLERGIC RHINITIS (ICD-477.9) - we discussed Rx w/ Zyrtek, Saline, Mucinex OTC...  HYPERTENSION (ICD-401.9) - on METOPROLOL XL 50mg /d, NORVASC 10mg /d, & LASIX 20mg - 1/2 tab daily...   ~  CXR 12/10 showed mild cardiomeg, clear lungs, NAD... ~  10/11:  BP=134/86, doing well w/ med + home BP checks... denies HA, fatigue, visual changes, CP, palipit, dizziness, syncope, dyspnea, edema, etc... ~  4/12:  BP= 120/76 & she continues to remain asymptomatic... ~  11/12:  BP= 122/72 & she continues stable w/o CP, palpit, dizzy, SOB, edema, or cerebral ischemic symptoms on her Aggrenox Bid. ~  5/13:  BP= 138/72 & she denies CP, palpit, SOB, edema, etc... ~  1/14: on Metop50, Amlod10, Lasix20-1/2daily; BP= 138/76 & she denies CP, palpit, dizzy, SOB, edema, etc... ~  CXR 1/14 showed normal heart size, clear lungs, surg clips in low neck, NAD.[name]. ~  7/14: BP is controlled on MetopER50-1/2 tab, Amlod10, and Lasix20-1/2tab daily; BP= 130/70 & she denies HA, dizzy, CP, palpit, SOB, edema, etc. ~  1/15: BP is regulated w/ MetopER50, Amlod10, Lasix20-1/2Qam; BP= 110/68 & she denies CP, palpit, SOB, edema, etc. ~  7/15: BP well controlled on Metop50, Amlod10, Lasix20-1/2 daily; BP= 130/70 & she denies CP, palpit, SOB, edema, or cerebral ischemic symptoms. ~  1/16: on Metop50, Amlod10, Lasix20-1/2daily prn; BP= 128/78 & she denies CP, palpit, dizzy, SOB, edema, etc.  MITRAL VALVE PROLAPSE (ICD-424.0) - clinical diagnosis in the past... no recent symptoms of CP or palpit... can't  find prev 2DEcho...  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 CDoppler's w/ non-obstructive plaque right carotid bulb, no signif ICA stenoses... ~  f/u CDoppler 5/10 showed mild plaque bilat, 0-39% blat ICA stenoses... f/u 40yr. ~  f/u CDoppler 5/11 showed mild plaque bilat, 0-39% bilat ICA stenoses, no change, f/u 61yr. ~  f/u CDoppler 4/12 showed mild plaque in bulbs, 0-39% bilat ICA stenoses, stable, repeat rec in 77yrs. ~  f/u CDopplers 4/14 showed mild heterogeneous plaque bilat, stable 0-39% bilat ICA stenoses, f/u 2 yrs... ~  f/u CDopplers 5/16 showed heterogeneous plaque bilat, progression of the right stenosis to 65-03% range, stable LICA at 5-46%; f/u 26yr...  HYPERLIPIDEMIA (ICD-272.4) - on SIMVASTATIN 20mg /d... she tried off meds 3/09 due to leg pains- but further eval showed HNP...  ~  pre-treatment FLP 3/04 showed TChol 282, TG 210, HDL 56, LDL 184... statin Rx started... ~  f/u FLP's on Statin Rx were good:  TChol 150-166, TG 87-119, HDL 35-49, LDL 86-107... ~  [place] 10/08 on Zocor20/d showed TChol 174, TG 136, HDL 47, LDL 99... ~  [place] 4/09 off Zocor showed TChol 285, TG 192, HDL 56, LDL 200... rec- restart Zocor20. ~  [place] 11/09 on Simva20 showed TChol 189, Tg 142, HDL 46, LDL 115... rec> continue same. ~  FLP 4/10 on Simva20 showed TChol 193, TG 114, HDL 55, LDL 116... may need incr dose. 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  ~  Endocrine eval by [name] 9/10 w/ radioactive iodine therapy- 106 mCi I- 131 given and scan showed only uptake in neck... ~  labs 4/10 showed TSH = 1.30 ~  9/10:  started on thyroid replacement by [name]- currently Synthroid 112 micrograms/d. ~  12/10: Synthroid decreased to 129mcg/d due to ?elevated BP?... labs followed by [name]. ~  9/11:  f/u [name] doing well- labs on SYNTHROID 172mcg/d= OK, and I-131 whole body scan reported neg. ~  4/12:  She continues regular f/u [name]> TSH= 0.36, Thyroid Ultrasound w/o recurrent or resid thyroid tissue seen... ~  4/13:  F/u [name] 4/13 w/ hx papillary thyroid cancer> s/p thyroidectomy & RAI therapy; on Levothy171mcg/d; ThySonar 4/13> no resid thyroid tissue or mass; "Everything was fine, no sign of cancer coming back" she says. ~  11/13: she saw [name] for 28mo ROV> papillary thyr ca, follicular variant- post surg hypothyroidism on Synthroid100; TSH=0.52; Throglob level remains undetectable... ~  Thyroid Ultrasound 6/14 showed no resid thyroid tissue & no adenopathy... ~  She continues regular Q109mo follow up visits w/ [name]...  COLONIC POLYPS (ICD-211.3) - colonoscopy 8/09 by [name] w/ several adenomatous polyps removed... f/u planned 5 yrs. ~  She saw [name] 12/12 w/ colonoscopy done> 2 polyps in asc colon= tubular adenomas & f/u suggested in 63yrs...  BACK PAIN, LUMBAR (ICD-724.2) - eval DrDuda 4/09 w/ HNP L5-S1 and treated w/ epid steroid shot by DrNewton and improved...  OSTEOPENIA (ICD-733.90) - prev on Fosamax but this was stopped by GYN after last BMD (2008) at [facility] showed improved BMD... she takes Caltrate, Vits, Vit D... ~  labs 4/09 showed Vit D level =  33 therefore started Vit D OTC 11-1998 daly. ~  labs 4/10 showed Vit D level = 59 on 1000 u daily. ~  pt reports that f/u BMD 9/10 was "excellent"... ~  labs 4/11 showed Vit d level = 39... rec> continue 1000 u daily. 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 stable on AGGRENOX Bid... ~  4/12:  She continues on Aggrenox bid w/o cerebral ischemic symptoms...  Skin Cancer - BCE removed from left ankle by DrNolan 8/10... Another removed from nose & followed by Moh's surg... ~  She has had several Basal cells removed w/ subseq Moh's surg at the Skin cancer center...  Hx Shingles >> she's had the Shingles vaccine... ~  5/13:  She reports sudden right hip pain w/ eval [name]- Shingles Rx w/ Valtrex & Vicodin by him... ~  12/13: she had an outbreak on right leg w/ pain; went to her Ortho- [name] & treated w/ Valtrex, Pred, Vicodin and resolved...  Health Maintenance:   ~  last colonoscopy 8/09 by [name]> see above. ~  GYN= DrRichardson... pt states she is  seen every other yr & Mammogram/ BMD at [facility]. ~  Immunizations:  Tetanus/ TDAP updated 2010;  Pneumovax in 2006 at age 33;  Given Prevnar-13 7/15;  she gets the Flu shots yearly...   Past Surgical History  Procedure Laterality Date  . Dilation and curettage of uterus    . Total thyroidectomy    . Polypectomy    . Colonoscopy    . Basal cell carcinoma removed from inside the left ear  11/04/2012    june 2015  . Eye surgery  08/22/2014    Outpatient Encounter Prescriptions as of 11/28/2014  Medication Sig  . amLODipine (NORVASC) 10 MG tablet Take 1 tablet (10 mg total) by mouth daily.  . Calcium Carbonate-Vitamin D (CALCIUM 600+D) 600-400 MG-UNIT per tablet Take 1 tablet by mouth daily.   . cetirizine (ZYRTEC)  5 MG tablet Take 5 mg by mouth as needed.    . cholecalciferol (VITAMIN D) 1000 UNITS tablet Take 1,000 Units by mouth daily.  [name] dextromethorphan-guaiFENesin (MUCINEX DM) 30-600 MG per 12 hr tablet Take 1 tablet by mouth as needed.   . dipyridamole-aspirin (AGGRENOX) 200-25 MG per 12 hr capsule TAKE ONE CAPSULE BY MOUTH TWICE A DAY  . furosemide (LASIX) 20 MG tablet Take 1/2 daily as needed for swelling  . levothyroxine (SYNTHROID, LEVOTHROID) 100 MCG tablet Take 100 mcg by mouth daily.    . metoprolol succinate (TOPROL-XL) 50 MG 24 hr tablet Take 1 tablet (50 mg total) by mouth daily. Take with or immediately following a meal.  . Multiple Vitamins-Minerals (CENTRUM SILVER PO) Take 1 tablet by mouth daily.    . simvastatin (ZOCOR) 20 MG tablet Take 1 tablet (20 mg total) by mouth at bedtime.  . traMADol (ULTRAM) 50 MG tablet Take 1 tablet (50 mg total) by mouth 3 (three) times daily as needed.  . vitamin E 400 UNIT capsule Take 400 Units by mouth daily.  . [DISCONTINUED] amoxicillin-clavulanate (AUGMENTIN) 875-125 MG per tablet Take 1 tablet by mouth 2 (two) times daily. (Patient not taking: Reported on 11/28/2014)  . [DISCONTINUED] predniSONE (STERAPRED UNI-PAK) 5 MG TABS tablet As directed 6 day pack (Patient not taking: Reported on 11/28/2014)    Allergies  Allergen Reactions  . Sulfonamide Derivatives     REACTION: rash    Current Medications, Allergies, Past Medical History, Past Surgical History, Family History, and Social History were reviewed in [employer] record.    Review of Systems        See HPI - all other systems neg except as noted... The patient denies anorexia, fever, weight loss, weight gain, vision loss, decreased hearing, hoarseness, chest pain, syncope, dyspnea on exertion, peripheral edema, prolonged cough, headaches, hemoptysis, abdominal pain, melena, hematochezia, severe indigestion/heartburn, hematuria, incontinence, muscle weakness,  suspicious skin lesions, transient blindness, difficulty walking, depression, unusual weight change, abnormal bleeding, enlarged lymph nodes, and angioedema.     Objective:   Physical Exam     WD, WN, 74 y/o WF in NAD... GENERAL:  Alert & oriented; pleasant & cooperative... HEENT:  [place]/AT, EOM-wnl, PERRLA, pterygium left eye, EACs-clear, TMs-wnl, NOSE-clear, THROAT-clear & wnl. NECK:  Supple w/ fairROM; no JVD; normal carotid impulses w/o bruits; scar of prev thyroid surg, no nodules, no adenopathy. CHEST:  Clear to P & A; without wheezes/ rales/ or rhonchi. HEART:  Regular Rhythm; without murmurs/ rubs/ or gallops. ABDOMEN:  Soft & nontender; normal bowel sounds; no organomegaly or masses detected. EXT: without deformities or arthritic changes; no varicose veins/  venous insuffic/ or edema. NEURO:  CN's intact; motor testing normal; sensory testing normal; gait normal & balance OK. DERM:  s/p surg behind left pinna  RADIOLOGY DATA:  Reviewed in the EPIC EMR & discussed w/ the patient...  LABORATORY DATA:  Reviewed in the EPIC EMR & discussed w/ the patient...   Assessment & Plan:    HBP>  Controlled on meds, tol well, asymptomatic, continue same...  Cerebrovasc Dis & hx TIA>  She remains asymptomatic w/o cerebral ischemic symptoms, continue Agrennox...  HYPERLIPID>  Stable on diet + Simva20...  THYROID>  Followed by [name], hx Thyroid Cancer & Hasimotos dis in the surg specimen> stable & doing well...  GI> Hx polyps>  She is up to date and had f/u colonoscopy 12/12 as above...  Osteopenia>  Managed by GYN DrRichardson & pt reports that her last BMD was "excellent"...  Skin Cancer>  She reports Moh's surg for BCE on nose, plus several "keratoses"; new lesion inside left pinna w/ surg pending...  Other medical issues as noted...   Patient's Medications  New Prescriptions   No medications on file  Previous Medications   AMLODIPINE (NORVASC) 10 MG TABLET    Take 1 tablet (10  mg total) by mouth daily.   CALCIUM CARBONATE-VITAMIN D (CALCIUM 600+D) 600-400 MG-UNIT PER TABLET    Take 1 tablet by mouth daily.    CETIRIZINE (ZYRTEC) 5 MG TABLET    Take 5 mg by mouth as needed.     CHOLECALCIFEROL (VITAMIN D) 1000 UNITS TABLET    Take 1,000 Units by mouth daily.   DEXTROMETHORPHAN-GUAIFENESIN (MUCINEX DM) 30-600 MG PER 12 HR TABLET    Take 1 tablet by mouth as needed.    DIPYRIDAMOLE-ASPIRIN (AGGRENOX) 200-25 MG PER 12 HR CAPSULE    TAKE ONE CAPSULE BY MOUTH TWICE A DAY   FUROSEMIDE (LASIX) 20 MG TABLET    Take 1/2 daily as needed for swelling   LEVOTHYROXINE (SYNTHROID, LEVOTHROID) 100 MCG TABLET    Take 100 mcg by mouth daily.     METOPROLOL SUCCINATE (TOPROL-XL) 50 MG 24 HR TABLET    Take 1 tablet (50 mg total) by mouth daily. Take with or immediately following a meal.   MULTIPLE VITAMINS-MINERALS (CENTRUM SILVER PO)    Take 1 tablet by mouth daily.     TRAMADOL (ULTRAM) 50 MG TABLET    Take 1 tablet (50 mg total) by mouth 3 (three) times daily as needed.   VITAMIN E 400 UNIT CAPSULE    Take 400 Units by mouth daily.  Modified Medications   Modified Medication Previous Medication   SIMVASTATIN (ZOCOR) 20 MG TABLET simvastatin (ZOCOR) 20 MG tablet      Take 1 tablet (20 mg total) by mouth at bedtime.    Take 1 tablet (20 mg total) by mouth at bedtime.  Discontinued Medications   AMOXICILLIN-CLAVULANATE (AUGMENTIN) 875-125 MG PER TABLET    Take 1 tablet by mouth 2 (two) times daily.   PREDNISONE (STERAPRED UNI-PAK) 5 MG TABS TABLET    As directed 6 day pack

## 2014-12-04 ENCOUNTER — Other Ambulatory Visit: Payer: Self-pay | Admitting: Pulmonary Disease

## 2014-12-19 DIAGNOSIS — H40059 Ocular hypertension, unspecified eye: Secondary | ICD-10-CM | POA: Diagnosis not present

## 2014-12-19 DIAGNOSIS — H2511 Age-related nuclear cataract, right eye: Secondary | ICD-10-CM | POA: Diagnosis not present

## 2015-01-01 DIAGNOSIS — H40059 Ocular hypertension, unspecified eye: Secondary | ICD-10-CM | POA: Diagnosis not present

## 2015-01-01 DIAGNOSIS — H2511 Age-related nuclear cataract, right eye: Secondary | ICD-10-CM | POA: Diagnosis not present

## 2015-01-14 DIAGNOSIS — Z803 Family history of malignant neoplasm of breast: Secondary | ICD-10-CM | POA: Diagnosis not present

## 2015-01-14 DIAGNOSIS — Z1231 Encounter for screening mammogram for malignant neoplasm of breast: Secondary | ICD-10-CM | POA: Diagnosis not present

## 2015-01-23 ENCOUNTER — Other Ambulatory Visit: Payer: Self-pay | Admitting: Pulmonary Disease

## 2015-01-28 ENCOUNTER — Telehealth: Payer: Self-pay | Admitting: Pulmonary Disease

## 2015-01-28 MED ORDER — TRAMADOL HCL 50 MG PO TABS: 50.0000 mg | ORAL_TABLET | Freq: Three times a day (TID) | ORAL | Status: DC | PRN

## 2015-01-28 NOTE — Telephone Encounter (Signed)
Spoke with the pt  She is c/o back pain since pulled a muscle working in her yard a wk ago  She has seen Dr Sharol Given in the past for back problems and will make appt with him if it does not improve in a few days  In the meantime, she is asking for rx for tramadol, states that SN has given this in the past  Please advise if okay to call in thanks! Allergies  Allergen Reactions  . Sulfonamide Derivatives     REACTION: rash

## 2015-01-28 NOTE — Telephone Encounter (Signed)
Rx for tramadol 50mg  #90 Refills 0 to be taken q8 hours prn pain called into pt pharmacy: [facility]. Pt called and informed of refill. No further action needed at this time.

## 2015-02-05 DIAGNOSIS — M5410 Radiculopathy, site unspecified: Secondary | ICD-10-CM | POA: Diagnosis not present

## 2015-02-05 DIAGNOSIS — M5416 Radiculopathy, lumbar region: Secondary | ICD-10-CM | POA: Diagnosis not present

## 2015-02-15 ENCOUNTER — Other Ambulatory Visit: Payer: Self-pay | Admitting: Pulmonary Disease

## 2015-02-15 DIAGNOSIS — G459 Transient cerebral ischemic attack, unspecified: Secondary | ICD-10-CM

## 2015-02-18 ENCOUNTER — Other Ambulatory Visit: Payer: Self-pay | Admitting: Pulmonary Disease

## 2015-02-18 DIAGNOSIS — I6523 Occlusion and stenosis of bilateral carotid arteries: Secondary | ICD-10-CM

## 2015-02-19 DIAGNOSIS — M5410 Radiculopathy, site unspecified: Secondary | ICD-10-CM | POA: Diagnosis not present

## 2015-02-28 DIAGNOSIS — M5416 Radiculopathy, lumbar region: Secondary | ICD-10-CM | POA: Diagnosis not present

## 2015-03-04 ENCOUNTER — Other Ambulatory Visit: Payer: Self-pay

## 2015-03-04 MED ORDER — SIMVASTATIN 20 MG PO TABS: 20.0000 mg | ORAL_TABLET | Freq: Every day | ORAL | Status: DC

## 2015-03-05 ENCOUNTER — Other Ambulatory Visit: Payer: Self-pay | Admitting: Pulmonary Disease

## 2015-03-05 ENCOUNTER — Encounter: Payer: Self-pay | Admitting: Neurology

## 2015-03-05 ENCOUNTER — Telehealth: Payer: Self-pay | Admitting: Neurology

## 2015-03-05 MED ORDER — FUROSEMIDE 20 MG PO TABS: ORAL_TABLET | ORAL | Status: DC

## 2015-03-05 MED ORDER — AMLODIPINE BESYLATE 10 MG PO TABS: 10.0000 mg | ORAL_TABLET | Freq: Every day | ORAL | Status: DC

## 2015-03-05 NOTE — Telephone Encounter (Signed)
I did the letter for Dr. Leonie Man. I put on your desk. Thanks.  Rosalin Hawking, MD PhD Stroke Neurology 03/05/2015 5:28 PM

## 2015-03-05 NOTE — Telephone Encounter (Signed)
Patient called and is having an injection by Dr Ernestina Patches and will need to be off Aggrenox before the injection also Megan from  Dr Newtons office called and said she faxed over a form to discontinue aggrenox please call patient and Megan at Dr Kennon [place] office Jinny Blossom [phone] Thanks dg

## 2015-03-05 NOTE — Telephone Encounter (Signed)
Received paper rx request for pt's norvasc and lasix. Per SN, both med refilled with a 90 day supply with additional refills. Request faxed back to [facility]

## 2015-03-06 ENCOUNTER — Telehealth: Payer: Self-pay | Admitting: *Deleted

## 2015-03-06 NOTE — Telephone Encounter (Signed)
Spoke with patient and confirmed her knowledge re: Aggrenox prior to and following her injection. She states she is waiting for her injectoin appt but repeated the correct information re: Aggrenox instructions per Dr Erlinda Hong. She verbalized appreciation for this call.

## 2015-03-11 ENCOUNTER — Ambulatory Visit (HOSPITAL_COMMUNITY): Payer: Medicare Other | Attending: Cardiovascular Disease

## 2015-03-11 DIAGNOSIS — I6523 Occlusion and stenosis of bilateral carotid arteries: Secondary | ICD-10-CM

## 2015-03-14 DIAGNOSIS — M5416 Radiculopathy, lumbar region: Secondary | ICD-10-CM | POA: Diagnosis not present

## 2015-04-08 DIAGNOSIS — E890 Postprocedural hypothyroidism: Secondary | ICD-10-CM | POA: Diagnosis not present

## 2015-04-08 DIAGNOSIS — C73 Malignant neoplasm of thyroid gland: Secondary | ICD-10-CM | POA: Diagnosis not present

## 2015-04-10 DIAGNOSIS — Z85850 Personal history of malignant neoplasm of thyroid: Secondary | ICD-10-CM | POA: Diagnosis not present

## 2015-04-10 DIAGNOSIS — E890 Postprocedural hypothyroidism: Secondary | ICD-10-CM | POA: Diagnosis not present

## 2015-05-29 ENCOUNTER — Ambulatory Visit (INDEPENDENT_AMBULATORY_CARE_PROVIDER_SITE_OTHER): Payer: Medicare Other | Admitting: Pulmonary Disease

## 2015-05-29 ENCOUNTER — Encounter: Payer: Self-pay | Admitting: Pulmonary Disease

## 2015-05-29 ENCOUNTER — Other Ambulatory Visit (INDEPENDENT_AMBULATORY_CARE_PROVIDER_SITE_OTHER): Payer: Medicare Other

## 2015-05-29 VITALS — BP 138/72 | HR 70 | Temp 97.0°F | Ht 61.0 in | Wt 125.6 lb

## 2015-05-29 DIAGNOSIS — D126 Benign neoplasm of colon, unspecified: Secondary | ICD-10-CM

## 2015-05-29 DIAGNOSIS — I6523 Occlusion and stenosis of bilateral carotid arteries: Secondary | ICD-10-CM | POA: Diagnosis not present

## 2015-05-29 DIAGNOSIS — E785 Hyperlipidemia, unspecified: Secondary | ICD-10-CM | POA: Diagnosis not present

## 2015-05-29 DIAGNOSIS — I679 Cerebrovascular disease, unspecified: Secondary | ICD-10-CM | POA: Diagnosis not present

## 2015-05-29 DIAGNOSIS — E032 Hypothyroidism due to medicaments and other exogenous substances: Secondary | ICD-10-CM

## 2015-05-29 DIAGNOSIS — I10 Essential (primary) hypertension: Secondary | ICD-10-CM | POA: Diagnosis not present

## 2015-05-29 DIAGNOSIS — M545 Low back pain, unspecified: Secondary | ICD-10-CM

## 2015-05-29 DIAGNOSIS — C73 Malignant neoplasm of thyroid gland: Secondary | ICD-10-CM | POA: Diagnosis not present

## 2015-05-29 DIAGNOSIS — G458 Other transient cerebral ischemic attacks and related syndromes: Secondary | ICD-10-CM

## 2015-05-29 DIAGNOSIS — R300 Dysuria: Secondary | ICD-10-CM

## 2015-05-29 DIAGNOSIS — M949 Disorder of cartilage, unspecified: Secondary | ICD-10-CM

## 2015-05-29 DIAGNOSIS — M899 Disorder of bone, unspecified: Secondary | ICD-10-CM

## 2015-05-29 LAB — URINALYSIS
Bilirubin Urine: NEGATIVE
Hgb urine dipstick: NEGATIVE
Ketones, ur: NEGATIVE
Leukocytes, UA: NEGATIVE
NITRITE: NEGATIVE
PH: 7 (ref 5.0–8.0)
Specific Gravity, Urine: 1.015 (ref 1.000–1.030)
TOTAL PROTEIN, URINE-UPE24: NEGATIVE
UROBILINOGEN UA: 0.2 (ref 0.0–1.0)
Urine Glucose: NEGATIVE

## 2015-05-29 MED ORDER — AMLODIPINE BESYLATE 10 MG PO TABS: 10.0000 mg | ORAL_TABLET | Freq: Every day | ORAL | Status: DC

## 2015-05-29 MED ORDER — FUROSEMIDE 20 MG PO TABS: ORAL_TABLET | ORAL | Status: DC

## 2015-05-29 MED ORDER — ASPIRIN-DIPYRIDAMOLE ER 25-200 MG PO CP12: 1.0000 | ORAL_CAPSULE | Freq: Two times a day (BID) | ORAL | Status: DC

## 2015-05-29 MED ORDER — SIMVASTATIN 20 MG PO TABS: 20.0000 mg | ORAL_TABLET | Freq: Every day | ORAL | Status: DC

## 2015-05-29 MED ORDER — TRAMADOL HCL 50 MG PO TABS: 50.0000 mg | ORAL_TABLET | Freq: Three times a day (TID) | ORAL | Status: DC | PRN

## 2015-05-29 NOTE — Patient Instructions (Signed)
Today we updated your med list in our EPIC system...    Continue your current medications the same...    We refilled your med per request...  Today we checked a urinalysis as discussed...    We will contact you w/ the results when available...   Call for any questions...  Let's plan a follow up visit in 109mo, sooner if needed for problems.Marland KitchenMarland Kitchen

## 2015-05-29 NOTE — Progress Notes (Signed)
Subjective:    Patient ID: [name], female    DOB: 1941-07-12, 74 y.o.   MRN: [id number]  HPI 74 y/o WF here for a follow up visit... she has mult med problems as noted below> ~  SEE PREV EPIC NOTES FOR OLDER DATA >>    CXR 1/14 showed norm heart size, clear lungs-wnl, clips in lower neck from thyroid surg...  LABS 1/14: FLP- at goals on Simva20;  Chems- wnl;  CBC- wnl;  VitD= 69;  Thyroid per DrKerr...  LABS 1/15:  FLP- at goals on Simva20;  Chems- wnl;  CBC- wnl;  TSH=0.42 on Levo100...   ~  May 24, 2014:  8mo ROV & [name] is doing well overall- DrDuda gave her 2 shots in her right shoulder (Feb & Mar)- now much improved...    BP well controlled on Metop50, Amlod10, Lasix20-1/2 daily; BP= 130/70 & she denies CP, palpit, SOB, edema, or cerebral ischemic symptoms...    She had Endocrine f/u DrKerr 5/15> hx thyroid Ca, he does labs & pt reports that everything was fine    She has Gyn f/u due soon for PAP, she reports recent BMD & mammorgram- OK by her hx; BMD from [facility] 2/15 showed Tscore -1.9 in left FemNeck...    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 We reviewed prob list, meds, xrays and labs> see below for updates >> Meds refilled & we gave her a Prevnar-13 shot today...  ~  November 28, 2014:  4mo ROV & [name] tells me she had left eye surg 10/15 by DrMSpencer (?pterigium)) w/ post op hemorrhage prob due to Aggrenox rx;  She also had an URI 12/15 & called in- given Augmentin & Pred=> symptoms resolved... We reviewed the following medical problems during today's office visit >>     AR> on Zyrtek, Mucinex; stable on prn med rx...    HBP, MVP> on Metop50, Amlod10, Lasix20-1/2daily prn; BP= 128/78 & she denies CP, palpit, dizzy, SOB, edema, etc...    Cerebrovasc dis & TIA> on Aggrenox25-200Bid; she remains asymptomatic w/o cerebral ischemic symptoms...    CHOL> on Simva20; FLP 1/16 shows TChol 173, TG 143, HDL 51, LDL 93; continue  same Rx & diet efforts...    Thyroid cancer & hypothy> on Synthroid100; followed by DrKerr Q50mo- seen 6/15 & doing well...    GI- colon polyps> followed by DrPerry w/ last colon 12/12 showing 2 sm adenomatous polyps removed & f/u planned 11yrs...    DJD right shoulder & LBP> off Vicodin & using Tramadol50 prn; she had eval DrDuda (given 2 shots in shoulder 2015 & improved) & ESI from [facility] in 2009; doing well w/o signif recurrent pain...    Osteopenia> on calcium, MVI, VitD; prev on Fosamax but stopped by GYN ?2008 & they follow her BMDs...    Skin Cancer> new- in left ext ear canal w/ surg planned by Derm 1/14...    Shingles> she had the shingles vaccine prev; developed rt leg rash/pain & eval by Ortho- DrHilts 12/13- treated w/ Valtrex, Pred, Vicodin & resolved... We reviewed prob list, meds, xrays and labs> see below for updates >> she had the 2015 flu vaccine 9/15 7 is up to date on all vaccinations...   CXR 1/16 showed norm heart size, calcif in Ao, clear lungs, NAD...   LABS 1/16:  FLP- at goals on Simva20;  Chems- wnl;  CBC- wnl;  TSH=0.40 on synth100;  VitD=51 on 1000u daily...   ~  May 29, 2015:  34mo ROV & [name] is stable but has mult somatic complaints- notes another epidural steroid shot 5/16 by DrNewton after failing Pred (she states intol "it tore up nmy stomach"); under stress since her brother had a stroke;  She has some dysuria & wants a urinalysis thinking that the Pred gave her a UTI...    BP stable on Amlod10, MetopER50, Lasix20-1/2 prn; BP=138/72 & she denies HAs, CP, palpit, SOB, edema...     She remains on Aggrenox Bid w/o cerebral ischemic symptoms, stable...    Lipids have been at goal on diet + Simva20...    Thyroid stable on Synthroid100...    She takes calcium, Vits, probiotic, Tramadol prn... EXAM shows Afeb, VSS, O2sat=100% on RA;  HEENT- neg, mallampati2;  Chest- clear w/o w/r/r;  Heart- RR w/o m/r/g;  AQbd- soft, nontender;  Ext- neg w/o c/c/e;  Neuro- neg,  intact... IMP/PLAN>>  [name] is stable on current meds- refilled per request; she already had the 2016 Flu vaccine & up to date; ROV 7mo...         Problem List:  ALLERGIC RHINITIS (ICD-477.9) - we discussed Rx w/ Zyrtek, Saline, Mucinex OTC...  HYPERTENSION (ICD-401.9) - on METOPROLOL XL 50mg /d, NORVASC 10mg /d, & LASIX 20mg - 1/2 tab daily...   ~  CXR 12/10 showed mild cardiomeg, clear lungs, NAD... ~  10/11:  BP=134/86, doing well w/ med + home BP checks... denies HA, fatigue, visual changes, CP, palipit, dizziness, syncope, dyspnea, edema, etc... ~  4/12:  BP= 120/76 & she continues to remain asymptomatic... ~  11/12:  BP= 122/72 & she continues stable w/o CP, palpit, dizzy, SOB, edema, or cerebral ischemic symptoms on her Aggrenox Bid. ~  5/13:  BP= 138/72 & she denies CP, palpit, SOB, edema, etc... ~  1/14: on Metop50, Amlod10, Lasix20-1/2daily; BP= 138/76 & she denies CP, palpit, dizzy, SOB, edema, etc... ~  CXR 1/14 showed normal heart size, clear lungs, surg clips in low neck, NAD.[name]. ~  7/14: BP is controlled on MetopER50-1/2 tab, Amlod10, and Lasix20-1/2tab daily; BP= 130/70 & she denies HA, dizzy, CP, palpit, SOB, edema, etc. ~  1/15: BP is regulated w/ MetopER50, Amlod10, Lasix20-1/2Qam; BP= 110/68 & she denies CP, palpit, SOB, edema, etc. ~  7/15: BP well controlled on Metop50, Amlod10, Lasix20-1/2 daily; BP= 130/70 & she denies CP, palpit, SOB, edema, or cerebral ischemic symptoms. ~  1/16: on Metop50, Amlod10, Lasix20-1/2daily prn; BP= 128/78 & she denies CP, palpit, dizzy, SOB, edema, etc.  MITRAL VALVE PROLAPSE (ICD-424.0) - clinical diagnosis in the past... no recent symptoms of CP or palpit... can't find prev 2DEcho...  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 CDoppler's w/ non-obstructive plaque right carotid bulb, no signif ICA stenoses... ~  f/u CDoppler 5/10 showed mild plaque bilat,  0-39% blat ICA stenoses... f/u 68yr. ~  f/u CDoppler 5/11 showed mild plaque bilat, 0-39% bilat ICA stenoses, no change, f/u 15yr. ~  f/u CDoppler 4/12 showed mild plaque in bulbs, 0-39% bilat ICA stenoses, stable, repeat rec in 48yrs. ~  f/u CDopplers 4/14 showed mild heterogeneous plaque bilat, stable 0-39% bilat ICA stenoses, f/u 2 yrs... ~  f/u CDopplers 5/16 showed heterogeneous plaque bilat, progression of the right stenosis to 16-10% range, stable LICA at 9-60%; f/u 67yr...  HYPERLIPIDEMIA (ICD-272.4) - on SIMVASTATIN 20mg /d... she tried off meds 3/09 due to leg pains- but further eval showed  HNP...  ~  pre-treatment FLP 3/04 showed TChol 282, TG 210, HDL 56, LDL 184... statin Rx started... ~  f/u FLP's on Statin Rx were good:  TChol 150-166, TG 87-119, HDL 35-49, LDL 86-107... ~  [place] 10/08 on Zocor20/d showed TChol 174, TG 136, HDL 47, LDL 99... ~  [place] 4/09 off Zocor showed TChol 285, TG 192, HDL 56, LDL 200... rec- restart Zocor20. ~  [place] 11/09 on Simva20 showed TChol 189, Tg 142, HDL 46, LDL 115... rec> continue same. ~  FLP 4/10 on Simva20 showed TChol 193, TG 114, HDL 55, LDL 116... may need incr dose. 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  ~  Endocrine eval by DrKerr 9/10 w/ radioactive iodine therapy- 106 mCi I- 131 given and scan showed only uptake in neck... ~  labs 4/10 showed TSH = 1.30 ~  9/10:  started on thyroid replacement by DrKerr- currently Synthroid 112 micrograms/d. ~  12/10: Synthroid decreased to 125mcg/d due to ?elevated BP?... labs followed by DrKerr. ~  9/11:  f/u DrKerr doing well- labs on SYNTHROID 128mcg/d= OK, and I-131 whole body scan reported neg. ~  4/12:  She continues regular f/u DrKerr> TSH= 0.36, Thyroid Ultrasound w/o recurrent or resid thyroid tissue seen... ~  4/13:  F/u DrKerr 4/13 w/ hx papillary thyroid cancer> s/p thyroidectomy & RAI therapy; on Levothy172mcg/d; ThySonar 4/13> no resid thyroid tissue or mass; "Everything was fine, no sign of cancer coming back" she says. ~  11/13: she saw DrKerr for 60mo ROV> papillary thyr ca, follicular variant- post surg hypothyroidism on Synthroid100; TSH=0.52; Throglob level remains undetectable... ~  Thyroid Ultrasound 6/14 showed no resid thyroid tissue & no adenopathy... ~  She continues regular Q31mo follow up visits w/ DrKerr...  COLONIC POLYPS (ICD-211.3) - colonoscopy 8/09 by DrPerry w/ several adenomatous polyps removed... f/u planned 5 yrs. ~  She saw DrPerry 12/12 w/ colonoscopy done> 2 polyps in asc colon= tubular adenomas & f/u suggested in 61yrs...  BACK PAIN, LUMBAR (ICD-724.2) - eval DrDuda 4/09 w/ HNP L5-S1 and treated w/ epid steroid shot by DrNewton and improved...  OSTEOPENIA (ICD-733.90) - prev on Fosamax but this was stopped by GYN after last BMD (2008) at [facility] showed improved BMD... she takes Caltrate, Vits, Vit D... ~  labs 4/09 showed Vit D level = 33 therefore started Vit D OTC 11-1998 daly. ~  labs 4/10 showed Vit D level = 59 on 1000 u daily. ~  pt reports that f/u BMD 9/10 was "excellent"... ~  labs 4/11 showed Vit d level = 39... rec> continue 1000 u daily. 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 stable on AGGRENOX Bid... ~  4/12:  She continues on Aggrenox bid w/o cerebral ischemic symptoms...  Skin Cancer - BCE removed from left ankle by DrNolan 8/10... Another removed from nose & followed by Moh's surg... ~  She has had several Basal cells removed w/ subseq Moh's surg at the Skin cancer center...  Hx Shingles >> she's had the Shingles vaccine... ~  5/13:  She reports sudden right hip pain w/ eval DrHilts- Shingles Rx w/ Valtrex & Vicodin by him... ~  12/13: she had an outbreak on right leg w/ pain; went to her Ortho- DrHilts & treated w/ Valtrex, Pred, Vicodin and resolved...  Health Maintenance:   ~  last colonoscopy 8/09 by DrPerry> see above. ~  GYN= DrRichardson... pt states she is seen every other yr & Mammogram/ BMD at [facility] ~  Immunizations:  Tetanus/ TDAP updated 2010;  Pneumovax in 2006 at age 35;  Given Prevnar-13 7/15;  she gets the Flu shots yearly...   Past Surgical History  Procedure Laterality Date  . Dilation and curettage of uterus    . Total thyroidectomy    . Polypectomy    . Colonoscopy    . Basal cell carcinoma removed from inside the left ear  11/04/2012    june 2015  . Eye surgery  08/22/2014    Outpatient Encounter Prescriptions as of 05/29/2015  Medication Sig  . AGGRENOX 25-200 MG per 12 hr capsule TAKE ONE CAPSULE BY MOUTH TWICE A DAY  . amLODipine (NORVASC) 10 MG tablet Take 1 tablet (10 mg total) by mouth daily.  . Calcium Carbonate-Vitamin D (CALCIUM 600+D) 600-400 MG-UNIT per tablet Take 1 tablet by mouth daily.   . cetirizine (ZYRTEC) 5 MG tablet Take 5 mg by mouth as needed.    . cholecalciferol (VITAMIN D) 1000 UNITS tablet Take 1,000 Units by mouth daily.  [name] dextromethorphan-guaiFENesin (MUCINEX DM) 30-600 MG per 12 hr  tablet Take 1 tablet by mouth as needed.   . furosemide (LASIX) 20 MG tablet Take 1/2 daily as needed for swelling  . Lactobacillus (PROBIOTIC ACIDOPHILUS PO) Take by mouth daily.  [name] levothyroxine (SYNTHROID, LEVOTHROID) 100 MCG tablet Take 100 mcg by mouth daily.    . metoprolol succinate (TOPROL-XL) 50 MG 24 hr tablet Take 1 tablet (50 mg total) by mouth daily. Take with or immediately following a meal.  . Multiple Vitamins-Minerals (CENTRUM SILVER PO) Take 1 tablet by mouth daily.    . simvastatin (ZOCOR) 20 MG tablet Take 1 tablet (20 mg total) by mouth at bedtime.  . traMADol (ULTRAM) 50 MG tablet Take 1 tablet (50 mg total) by mouth 3 (three) times daily as needed.  . vitamin E 400 UNIT capsule Take 400 Units by mouth daily.  . [DISCONTINUED] dipyridamole-aspirin (AGGRENOX) 200-25 MG per 12 hr capsule TAKE ONE CAPSULE BY MOUTH TWICE A DAY (Patient not taking: Reported on 05/29/2015)   Facility-Administered Encounter Medications as of 05/29/2015  Medication  . 0.9 %  sodium chloride infusion    Allergies  Allergen Reactions  . Sulfonamide Derivatives     REACTION: rash    Immunization History  Administered Date(s) Administered  . H1N1 10/09/2008  . Influenza Split 07/20/2011,  07/14/2012  . Influenza Whole 07/25/2008, 08/09/2009, 07/25/2010  . Influenza,inj,Quad PF,36+ Mos 07/26/2013, 07/30/2014, 07/30/2015  . Pneumococcal Conjugate-13 05/24/2014  . Pneumococcal Polysaccharide-23 11/02/2006  . Td 02/27/2009  . Zoster 11/02/2010    Current Medications, Allergies, Past Medical History, Past Surgical History, Family History, and Social History were reviewed in [employer] record.    Review of Systems        See HPI - all other systems neg except as noted... The patient denies anorexia, fever, weight loss, weight gain, vision loss, decreased hearing, hoarseness, chest pain, syncope, dyspnea on exertion, peripheral edema, prolonged cough, headaches,  hemoptysis, abdominal pain, melena, hematochezia, severe indigestion/heartburn, hematuria, incontinence, muscle weakness, suspicious skin lesions, transient blindness, difficulty walking, depression, unusual weight change, abnormal bleeding, enlarged lymph nodes, and angioedema.     Objective:   Physical Exam     WD, WN, 74 y/o WF in NAD... GENERAL:  Alert & oriented; pleasant & cooperative... HEENT:  [place]/AT, EOM-wnl, PERRLA, pterygium left eye, EACs-clear, TMs-wnl, NOSE-clear, THROAT-clear & wnl. NECK:  Supple w/ fairROM; no JVD; normal carotid impulses w/o bruits; scar of prev thyroid surg, no nodules, no adenopathy. CHEST:  Clear to P & A; without wheezes/ rales/ or rhonchi. HEART:  Regular Rhythm; without murmurs/ rubs/ or gallops. ABDOMEN:  Soft & nontender; normal bowel sounds; no organomegaly or masses detected. EXT: without deformities or arthritic changes; no varicose veins/ venous insuffic/ or edema. NEURO:  CN's intact; motor testing normal; sensory testing normal; gait normal & balance OK. DERM:  s/p surg behind left pinna  RADIOLOGY DATA:  Reviewed in the EPIC EMR & discussed w/ the patient...  LABORATORY DATA:  Reviewed in the EPIC EMR & discussed w/ the patient...   Assessment & Plan:    HBP>  Controlled on meds, tol well, asymptomatic, continue same...  Cerebrovasc Dis & hx TIA>  She remains asymptomatic w/o cerebral ischemic symptoms, continue Agrennox...  HYPERLIPID>  Stable on diet + Simva20...  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  GI> Hx polyps>  She is up to date and had f/u colonoscopy 12/12 as above...  Osteopenia>  Managed by GYN DrRichardson & pt reports that her last BMD was "excellent"...  Skin Cancer>  She reports Moh's surg for BCE on nose, plus several "keratoses"; new lesion inside left pinna w/ surg pending...  Other medical issues as noted.Marland KitchenMarland Kitchen

## 2015-06-03 DIAGNOSIS — H00025 Hordeolum internum left lower eyelid: Secondary | ICD-10-CM | POA: Diagnosis not present

## 2015-06-12 ENCOUNTER — Other Ambulatory Visit: Payer: Self-pay | Admitting: Pulmonary Disease

## 2015-07-11 DIAGNOSIS — H2513 Age-related nuclear cataract, bilateral: Secondary | ICD-10-CM | POA: Diagnosis not present

## 2015-07-17 ENCOUNTER — Encounter: Payer: Self-pay | Admitting: Internal Medicine

## 2015-07-29 ENCOUNTER — Ambulatory Visit (INDEPENDENT_AMBULATORY_CARE_PROVIDER_SITE_OTHER): Payer: Medicare Other

## 2015-07-29 DIAGNOSIS — Z23 Encounter for immunization: Secondary | ICD-10-CM

## 2015-07-30 DIAGNOSIS — Z23 Encounter for immunization: Secondary | ICD-10-CM

## 2015-08-06 DIAGNOSIS — C44319 Basal cell carcinoma of skin of other parts of face: Secondary | ICD-10-CM | POA: Diagnosis not present

## 2015-08-06 DIAGNOSIS — L820 Inflamed seborrheic keratosis: Secondary | ICD-10-CM | POA: Diagnosis not present

## 2015-08-06 DIAGNOSIS — C44519 Basal cell carcinoma of skin of other part of trunk: Secondary | ICD-10-CM | POA: Diagnosis not present

## 2015-09-17 DIAGNOSIS — C44519 Basal cell carcinoma of skin of other part of trunk: Secondary | ICD-10-CM | POA: Diagnosis not present

## 2015-09-20 ENCOUNTER — Telehealth: Payer: Self-pay | Admitting: Pulmonary Disease

## 2015-09-20 MED ORDER — AZITHROMYCIN 250 MG PO TABS: ORAL_TABLET | ORAL | Status: DC

## 2015-09-20 NOTE — Telephone Encounter (Signed)
Per SN>> Call in z-pak with 1 additional refill please

## 2015-09-20 NOTE — Telephone Encounter (Signed)
Patient calling to check on status, [phone].

## 2015-09-20 NOTE — Telephone Encounter (Signed)
Called and spoke with pt and informed of SN rec Pt voiced understanding of rec and medication instructions  Order sent electronically to pt's pharmacy   Nothing further is needed

## 2015-09-20 NOTE — Telephone Encounter (Signed)
Patient says that she started out with sore, scratchy throat. Got better, now she has sinus drainage down the back of her throat, yellow/green mucus.  Patient would like Zpak called in.    [facility] [address]  Allergies  Allergen Reactions  . Sulfonamide Derivatives     REACTION: rash   Current Outpatient Prescriptions on File Prior to Visit  Medication Sig Dispense Refill  . amLODipine (NORVASC) 10 MG tablet Take 1 tablet (10 mg total) by mouth daily. 90 tablet 3  . Calcium Carbonate-Vitamin D (CALCIUM 600+D) 600-400 MG-UNIT per tablet Take 1 tablet by mouth daily.     . cetirizine (ZYRTEC) 5 MG tablet Take 5 mg by mouth as needed.      . cholecalciferol (VITAMIN D) 1000 UNITS tablet Take 1,000 Units by mouth daily.    [name] dextromethorphan-guaiFENesin (MUCINEX DM) 30-600 MG per 12 hr tablet Take 1 tablet by mouth as needed.     . dipyridamole-aspirin (AGGRENOX) 200-25 MG per 12 hr capsule Take 1 capsule by mouth 2 (two) times daily. 180 capsule 3  . furosemide (LASIX) 20 MG tablet Take 1/2 daily as needed for swelling 90 tablet 0  . Lactobacillus (PROBIOTIC ACIDOPHILUS PO) Take by mouth daily.    [name] levothyroxine (SYNTHROID, LEVOTHROID) 100 MCG tablet Take 100 mcg by mouth daily.      . metoprolol succinate (TOPROL-XL) 50 MG 24 hr tablet TAKE 1 TABLET BY MOUTH EVERY DAY TAKE WITH OR IMMEDIATELY FOLLOWING A MEAL 30 tablet 5  . Multiple Vitamins-Minerals (CENTRUM SILVER PO) Take 1 tablet by mouth daily.      . simvastatin (ZOCOR) 20 MG tablet Take 1 tablet (20 mg total) by mouth at bedtime. 90 tablet 3  . traMADol (ULTRAM) 50 MG tablet Take 1 tablet (50 mg total) by mouth 3 (three) times daily as needed. 90 tablet 5  . vitamin E 400 UNIT capsule Take 400 Units by mouth daily.     Current Facility-Administered Medications on File Prior to Visit  Medication Dose Route Frequency Provider Last Rate Last Dose  . 0.9 %  sodium chloride infusion  500 mL Intravenous Continuous Irene Shipper, MD

## 2015-10-08 DIAGNOSIS — C44119 Basal cell carcinoma of skin of left eyelid, including canthus: Secondary | ICD-10-CM | POA: Diagnosis not present

## 2015-11-28 DIAGNOSIS — L821 Other seborrheic keratosis: Secondary | ICD-10-CM | POA: Diagnosis not present

## 2015-11-28 DIAGNOSIS — Z85828 Personal history of other malignant neoplasm of skin: Secondary | ICD-10-CM | POA: Diagnosis not present

## 2015-11-28 DIAGNOSIS — L812 Freckles: Secondary | ICD-10-CM | POA: Diagnosis not present

## 2015-11-28 DIAGNOSIS — D225 Melanocytic nevi of trunk: Secondary | ICD-10-CM | POA: Diagnosis not present

## 2015-11-28 DIAGNOSIS — D1801 Hemangioma of skin and subcutaneous tissue: Secondary | ICD-10-CM | POA: Diagnosis not present

## 2015-11-29 ENCOUNTER — Ambulatory Visit: Payer: Medicare Other | Admitting: Pulmonary Disease

## 2015-12-03 ENCOUNTER — Ambulatory Visit (INDEPENDENT_AMBULATORY_CARE_PROVIDER_SITE_OTHER)
Admission: RE | Admit: 2015-12-03 | Discharge: 2015-12-03 | Disposition: A | Discharge status: Home / Self Care | Payer: Medicare Other | Source: Ambulatory Visit | Attending: Pulmonary Disease | Admitting: Pulmonary Disease

## 2015-12-03 ENCOUNTER — Other Ambulatory Visit (INDEPENDENT_AMBULATORY_CARE_PROVIDER_SITE_OTHER): Payer: Medicare Other

## 2015-12-03 ENCOUNTER — Other Ambulatory Visit: Payer: Medicare Other

## 2015-12-03 ENCOUNTER — Ambulatory Visit (INDEPENDENT_AMBULATORY_CARE_PROVIDER_SITE_OTHER): Payer: Medicare Other | Admitting: Pulmonary Disease

## 2015-12-03 ENCOUNTER — Encounter: Payer: Self-pay | Admitting: Pulmonary Disease

## 2015-12-03 VITALS — BP 138/72 | HR 70 | Temp 97.0°F | Ht 61.0 in | Wt 125.6 lb

## 2015-12-03 DIAGNOSIS — I10 Essential (primary) hypertension: Secondary | ICD-10-CM

## 2015-12-03 DIAGNOSIS — E032 Hypothyroidism due to medicaments and other exogenous substances: Secondary | ICD-10-CM | POA: Diagnosis not present

## 2015-12-03 DIAGNOSIS — M949 Disorder of cartilage, unspecified: Secondary | ICD-10-CM

## 2015-12-03 DIAGNOSIS — D126 Benign neoplasm of colon, unspecified: Secondary | ICD-10-CM

## 2015-12-03 DIAGNOSIS — I679 Cerebrovascular disease, unspecified: Secondary | ICD-10-CM

## 2015-12-03 DIAGNOSIS — C73 Malignant neoplasm of thyroid gland: Secondary | ICD-10-CM

## 2015-12-03 DIAGNOSIS — E559 Vitamin D deficiency, unspecified: Secondary | ICD-10-CM

## 2015-12-03 DIAGNOSIS — G458 Other transient cerebral ischemic attacks and related syndromes: Secondary | ICD-10-CM

## 2015-12-03 DIAGNOSIS — E785 Hyperlipidemia, unspecified: Secondary | ICD-10-CM

## 2015-12-03 DIAGNOSIS — Z0000 Encounter for general adult medical examination without abnormal findings: Secondary | ICD-10-CM | POA: Diagnosis not present

## 2015-12-03 DIAGNOSIS — I700 Atherosclerosis of aorta: Secondary | ICD-10-CM | POA: Diagnosis not present

## 2015-12-03 DIAGNOSIS — M545 Low back pain: Secondary | ICD-10-CM

## 2015-12-03 DIAGNOSIS — M899 Disorder of bone, unspecified: Secondary | ICD-10-CM

## 2015-12-03 DIAGNOSIS — I6523 Occlusion and stenosis of bilateral carotid arteries: Secondary | ICD-10-CM | POA: Diagnosis not present

## 2015-12-03 LAB — BASIC METABOLIC PANEL
BUN: 16 mg/dL (ref 6–23)
CHLORIDE: 101 meq/L (ref 96–112)
CO2: 29 mEq/L (ref 19–32)
Calcium: 10.2 mg/dL (ref 8.4–10.5)
Creatinine, Ser: 0.65 mg/dL (ref 0.40–1.20)
GFR: 94.23 mL/min (ref >60.00)
Glucose, Bld: 100 mg/dL — ABNORMAL HIGH (ref 70–99)
POTASSIUM: 3.9 meq/L (ref 3.5–5.1)
Sodium: 140 mEq/L (ref 135–145)

## 2015-12-03 LAB — LIPID PANEL
CHOL/HDL RATIO: 3
Cholesterol: 168 mg/dL (ref 0–200)
HDL: 57.7 mg/dL (ref >39.00)
LDL CALC: 84 mg/dL (ref 0–99)
NONHDL: 110.07
Triglycerides: 128 mg/dL (ref 0.0–149.0)
VLDL: 25.6 mg/dL (ref 0.0–40.0)

## 2015-12-03 LAB — CBC WITH DIFFERENTIAL/PLATELET
BASOS PCT: 0.4 % (ref 0.0–3.0)
Basophils Absolute: 0 10*3/uL (ref 0.0–0.1)
EOS ABS: 0.1 10*3/uL (ref 0.0–0.7)
EOS PCT: 2.4 % (ref 0.0–5.0)
HEMATOCRIT: 45.5 % (ref 36.0–46.0)
HEMOGLOBIN: 15 g/dL (ref 12.0–15.0)
LYMPHS PCT: 25.1 % (ref 12.0–46.0)
Lymphs Abs: 1.4 10*3/uL (ref 0.7–4.0)
MCHC: 33.1 g/dL (ref 30.0–36.0)
MCV: 89.3 fl (ref 78.0–100.0)
MONO ABS: 0.4 10*3/uL (ref 0.1–1.0)
Monocytes Relative: 7.5 % (ref 3.0–12.0)
NEUTROS ABS: 3.7 10*3/uL (ref 1.4–7.7)
Neutrophils Relative %: 64.6 % (ref 43.0–77.0)
PLATELETS: 288 10*3/uL (ref 150.0–400.0)
RBC: 5.09 Mil/uL (ref 3.87–5.11)
RDW: 13.3 % (ref 11.5–15.5)
WBC: 5.7 10*3/uL (ref 4.0–10.5)

## 2015-12-03 LAB — HEPATIC FUNCTION PANEL
ALT: 21 U/L (ref 0–35)
AST: 21 U/L (ref 0–37)
Albumin: 4.8 g/dL (ref 3.5–5.2)
Alkaline Phosphatase: 59 U/L (ref 39–117)
BILIRUBIN DIRECT: 0.1 mg/dL (ref 0.0–0.3)
BILIRUBIN TOTAL: 0.5 mg/dL (ref 0.2–1.2)
Total Protein: 8 g/dL (ref 6.0–8.3)

## 2015-12-03 LAB — VITAMIN D 25 HYDROXY (VIT D DEFICIENCY, FRACTURES): VITD: 55.77 ng/mL (ref 30.00–100.00)

## 2015-12-03 LAB — TSH: TSH: 0.37 u[IU]/mL (ref 0.35–4.50)

## 2015-12-03 MED ORDER — METOPROLOL SUCCINATE ER 50 MG PO TB24: ORAL_TABLET | ORAL | Status: DC

## 2015-12-03 MED ORDER — SIMVASTATIN 20 MG PO TABS: 20.0000 mg | ORAL_TABLET | Freq: Every day | ORAL | Status: DC

## 2015-12-03 MED ORDER — DICYCLOMINE HCL 20 MG PO TABS: 20.0000 mg | ORAL_TABLET | Freq: Three times a day (TID) | ORAL | Status: DC | PRN

## 2015-12-03 MED ORDER — FUROSEMIDE 20 MG PO TABS: ORAL_TABLET | ORAL | Status: DC

## 2015-12-03 MED ORDER — ASPIRIN-DIPYRIDAMOLE ER 25-200 MG PO CP12: 1.0000 | ORAL_CAPSULE | Freq: Two times a day (BID) | ORAL | Status: DC

## 2015-12-03 NOTE — Progress Notes (Signed)
Subjective:    Patient ID: [name], female    DOB: [birth year], 75 y.o.   MRN: [id number]  HPI 75 y/o WF here for a follow up visit... she has mult med problems as noted below> ~  SEE PREV EPIC NOTES FOR OLDER DATA >>    CXR 1/14 showed norm heart size, clear lungs-wnl, clips in lower neck from thyroid surg...  LABS 1/14: FLP- at goals on Simva20;  Chems- wnl;  CBC- wnl;  VitD= 69;  Thyroid per DrKerr...  LABS 1/15:  FLP- at goals on Simva20;  Chems- wnl;  CBC- wnl;  TSH=0.42 on Levo100...   ~  November 28, 2014:  75mo ROV & [name] tells me she had left eye surg 10/15 by DrMSpencer (?pterigium)) w/ post op hemorrhage prob due to Aggrenox rx;  She also had an URI 12/15 & called in- given Augmentin & Pred=> symptoms resolved... We reviewed the following medical problems during today's office visit >>     AR> on Zyrtek, Mucinex; stable on prn med rx...    HBP, MVP> on Metop50, Amlod10, Lasix20-1/2daily prn; BP= 128/78 & she denies CP, palpit, dizzy, SOB, edema, etc...    Cerebrovasc dis & TIA> on Aggrenox25-200Bid; she remains asymptomatic w/o cerebral ischemic symptoms...    CHOL> on Simva20; FLP 1/16 shows TChol 173, TG 143, HDL 51, LDL 93; continue same Rx & diet efforts...    Thyroid cancer & hypothy> on Synthroid100; followed by DrKerr Q3mo- seen 6/15 & doing well...    GI- colon polyps> followed by DrPerry w/ last colon 12/12 showing 2 sm adenomatous polyps removed & f/u planned 22yrs...    DJD right shoulder & LBP> off Vicodin & using Tramadol50 prn; she had eval DrDuda (given 2 shots in shoulder 2015 & improved) & ESI from [facility] in 2009; doing well w/o signif recurrent pain...    Osteopenia> on calcium, MVI, VitD; prev on Fosamax but stopped by GYN ?2008 & they follow her BMDs...    Skin Cancer> new- in left ext ear canal w/ surg planned by Derm 1/14...    Shingles> she had the shingles vaccine prev; developed rt leg rash/pain & eval by Ortho- DrHilts 12/13- treated w/ Valtrex,  Pred, Vicodin & resolved... We reviewed prob list, meds, xrays and labs> see below for updates >> she had the 2015 flu vaccine 9/15 7 is up to date on all vaccinations...   CXR 1/16 showed norm heart size, calcif in Ao, clear lungs, NAD...   LABS 1/16:  FLP- at goals on Simva20;  Chems- wnl;  CBC- wnl;  TSH=0.40 on synth100;  VitD=51 on 1000u daily...   ~  May 29, 2015:  32mo ROV & [name] is stable but has mult somatic complaints- notes another epidural steroid shot 5/16 by DrNewton after failing Pred (she states intol "it tore up nmy stomach"); under stress since her brother had a stroke;  She has some dysuria & wants a urinalysis thinking that the Pred gave her a UTI...    BP stable on Amlod10, MetopER50, Lasix20-1/2 prn; BP=138/72 & she denies HAs, CP, palpit, SOB, edema...     She remains on Aggrenox Bid w/o cerebral ischemic symptoms, stable...    Lipids have been at goal on diet + Simva20...    Thyroid stable on Synthroid100...    She takes calcium, Vits, probiotic, Tramadol prn... EXAM shows Afeb, VSS, O2sat=100% on RA;  HEENT- neg, mallampati2;  Chest- clear w/o w/r/r;  Heart- RR w/o m/r/g;  AQbd-  soft, nontender;  Ext- neg w/o c/c/e;  Neuro- neg, intact... IMP/PLAN>>  [name] is stable on current meds- refilled per request; she already had the 2016 Flu vaccine & up to date; ROV 47mo...  ~  December 03, 2015:  68mo [place] reports that she had a skin cancer removed from under her left eye at the skin cancer center DrAlbertini;  She has mult somatic complaints including left ear discomfort, & lower abd pain x several days- we discussed IBS & Rx trial w/ Bentyl vs referral to GI- DrPerry    AR> on Zyrtek, Mucinex; stable on prn med rx...    HBP, MVP> on Metop50, Amlod10, Lasix20-1/2daily prn; BP= 138/72 & she denies CP, palpit, dizzy, SOB, edema, etc...    Cerebrovasc dis & TIA> on Aggrenox25-200Bid; she remains asymptomatic w/o cerebral ischemic symptoms...    CHOL> on Simva20; FLP 1/17  shows TChol 168, TG 128, HDL 58, LDL 84; continue same Rx & diet efforts...    Thyroid cancer & hypothy> s/p thyroid surg, on Synthroid100; followed by DrKerr Q34mo & doing well; Labs here 1/17 showed TSH= 0.37    GI- colon polyps> followed by DrPerry w/ last colon 12/12 showing 2 sm adenomatous polyps removed & f/u planned 24yrs; c/o IBS like symptoms 1/17- trial Bentyl20 tid prn...    DJD right shoulder & LBP> off Vicodin & using Tramadol50 prn; she had eval DrDuda (given 2 shots in shoulder 2015 & improved) & ESI from [facility] in 2009; doing well w/o signif recurrent pain...    Osteopenia> on calcium, MVI, VitD; prev on Fosamax but stopped by GYN ?2008 & they follow her BMDs...    Skin Cancer> new- in left ext ear canal w/ surg by Payton Mccallum 1/14...    Shingles> she had the shingles vaccine prev; developed rt leg rash/pain & eval by Ortho- DrHilts 12/13- treated w/ Valtrex, Pred, Vicodin & resolved... EXAM shows Afeb, VSS, O2sat=98% on RA;  HEENT- neg x cerumen, mallampati2;  Chest- clear w/o w/r/r;  Heart- RR w/o m/r/g;  AQbd- soft, nontender;  Ext- neg w/o c/c/e;  Neuro- neg, intact...  CXR 12/03/15> norm heart size, aortic calcif is noted, clear lungs/ NAD.Marland KitchenMarland Kitchen   EKG 12/03/15>  NSR, rate64, wnl/ NAD....  LABS 12/03/15>  FLP- at goals on simva20- continue same;  Chems- wnl on diet alone;  CBC- wnl;  TSH=0.37 on Synthroid100 per drKerr;  VitD level = 56...  IMP/PLAN>>  [name] is stable- we'll try Bentyl for abd cramping & f/u w/ DrPerry if not responding;  Also needs ENT for cerumen impactions; due to Gyn check w/ DrRichardson...         Problem List:  ALLERGIC RHINITIS (ICD-477.9) - we discussed Rx w/ Zyrtek, Saline, Mucinex OTC...  HYPERTENSION (ICD-401.9) - on METOPROLOL XL 50mg /d, NORVASC 10mg /d, & LASIX 20mg - 1/2 tab daily...   ~  CXR 12/10 showed mild cardiomeg, clear lungs, NAD... ~  10/11:  BP=134/86, doing well w/ med + home BP checks... denies HA, fatigue, visual changes, CP, palipit,  dizziness, syncope, dyspnea, edema, etc... ~  4/12:  BP= 120/76 & she continues to remain asymptomatic... ~  11/12:  BP= 122/72 & she continues stable w/o CP, palpit, dizzy, SOB, edema, or cerebral ischemic symptoms on her Aggrenox Bid. ~  5/13:  BP= 138/72 & she denies CP, palpit, SOB, edema, etc... ~  1/14: on Metop50, Amlod10, Lasix20-1/2daily; BP= 138/76 & she denies CP, palpit, dizzy, SOB, edema, etc... ~  CXR 1/14 showed normal heart  size, clear lungs, surg clips in low neck, NAD.[name]. ~  7/14: BP is controlled on MetopER50-1/2 tab, Amlod10, and Lasix20-1/2tab daily; BP= 130/70 & she denies HA, dizzy, CP, palpit, SOB, edema, etc. ~  1/15: BP is regulated w/ MetopER50, Amlod10, Lasix20-1/2Qam; BP= 110/68 & she denies CP, palpit, SOB, edema, etc. ~  7/15: BP well controlled on Metop50, Amlod10, Lasix20-1/2 daily; BP= 130/70 & she denies CP, palpit, SOB, edema, or cerebral ischemic symptoms. ~  1/16: on Metop50, Amlod10, Lasix20-1/2daily prn; BP= 128/78 & she denies CP, palpit, dizzy, SOB, edema, etc. ~  1/17:  BP remains under good control...  MITRAL VALVE PROLAPSE (ICD-424.0) - clinical diagnosis in the past... no recent symptoms of CP or palpit... can't find prev 2DEcho...  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 CDoppler's w/ non-obstructive plaque right carotid bulb, no signif ICA stenoses... ~  f/u CDoppler 5/10 showed mild plaque bilat, 0-39% blat ICA stenoses... f/u 55yr. ~  f/u CDoppler 5/11 showed mild plaque bilat, 0-39% bilat ICA stenoses, no change, f/u 33yr. ~  f/u CDoppler 4/12 showed mild plaque in bulbs, 0-39% bilat ICA stenoses, stable, repeat rec in 83yrs. ~  f/u CDopplers 4/14 showed mild heterogeneous plaque bilat, stable 0-39% bilat ICA stenoses, f/u 2 yrs... ~  f/u CDopplers 5/16 showed heterogeneous plaque bilat, progression of the right stenosis to 123456 range, stable LICA at [id number]; f/u  13yr...  HYPERLIPIDEMIA (ICD-272.4) - on SIMVASTATIN 20mg /d... she tried off meds 3/09 due to leg pains- but further eval showed HNP...  ~  pre-treatment FLP 3/04 showed TChol 282, TG 210, HDL 56, LDL 184... statin Rx started... ~  f/u FLP's on Statin Rx were good:  TChol 150-166, TG 87-119, HDL 35-49, LDL 86-107... ~  [place] 10/08 on Zocor20/d showed TChol 174, TG 136, HDL 47, LDL 99... ~  [place] 4/09 off Zocor showed TChol 285, TG 192, HDL 56, LDL 200... rec- restart Zocor20. ~  [place] 11/09 on Simva20 showed TChol 189, Tg 142, HDL 46, LDL 115... rec> continue same. ~  FLP 4/10 on Simva20 showed TChol 193, TG 114, HDL 55, LDL 116... may need incr dose. 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  ~  Endocrine eval by DrKerr 9/10 w/ radioactive iodine therapy- 106 mCi I- 131 given and scan showed only uptake in neck... ~  labs 4/10 showed TSH = 1.30 ~  9/10:  started on thyroid replacement by DrKerr- currently Synthroid 112 micrograms/d. ~  12/10:  Synthroid decreased to 159mcg/d due to ?elevated BP?... labs followed by DrKerr. ~  9/11:  f/u DrKerr doing well- labs on SYNTHROID 135mcg/d= OK,  and I-131 whole body scan reported neg. ~  4/12:  She continues regular f/u DrKerr> TSH= 0.36, Thyroid Ultrasound w/o recurrent or resid thyroid tissue seen... ~  4/13:  F/u DrKerr 4/13 w/ hx papillary thyroid cancer> s/p thyroidectomy & RAI therapy; on Levothy167mcg/d; ThySonar 4/13> no resid thyroid tissue or mass; "Everything was fine, no sign of cancer coming back" she says. ~  11/13: she saw DrKerr for 67mo ROV> papillary thyr ca, follicular variant- post surg hypothyroidism on Synthroid100; TSH=0.52; Throglob level remains undetectable... ~  Thyroid Ultrasound 6/14 showed no resid thyroid tissue & no adenopathy... ~  She continues regular Q39mo follow up visits w/ DrKerr...  COLONIC POLYPS (ICD-211.3) - colonoscopy 8/09 by DrPerry w/ several adenomatous polyps removed... f/u planned 5 yrs. ~  She saw DrPerry 12/12 w/ colonoscopy done> 2 polyps in asc colon= tubular adenomas & f/u suggested in 64yrs...  BACK PAIN, LUMBAR (ICD-724.2) - eval DrDuda 4/09 w/ HNP L5-S1 and treated w/ epid steroid shot by DrNewton and improved...  OSTEOPENIA (ICD-733.90) - prev on Fosamax but this was stopped by GYN after last BMD (2008) at [facility] showed improved BMD... she takes Caltrate, Vits, Vit D... ~  labs 4/09 showed Vit D level = 33 therefore started Vit D OTC 11-1998 daly. ~  labs 4/10 showed Vit D level = 59 on 1000 u daily. ~  pt reports that f/u BMD 9/10 was "excellent"... ~  labs 4/11 showed Vit d level = 39... rec> continue 1000 u daily. ~  She had BMD at [facility] 12/12 per Gyn> TScore in left FemNeck -1.9 ~  BMD at [facility] 2/15 showed lowest Tscore -1.9 in right 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 stable on AGGRENOX Bid... ~  4/12:  She continues on Aggrenox bid w/o cerebral ischemic symptoms...  Skin Cancer - BCE removed from left ankle by DrNolan 8/10... Another removed from nose & followed by  Moh's surg... ~  She has had several Basal cells removed w/ subseq Moh's surg at the Skin cancer center...  Hx Shingles >> she's had the Shingles vaccine... ~  5/13:  She reports sudden right hip pain w/ eval DrHilts- Shingles Rx w/ Valtrex & Vicodin by him... ~  12/13: she had an outbreak on right leg w/ pain; went to her Ortho- DrHilts & treated w/ Valtrex, Pred, Vicodin and resolved...  Health Maintenance:   ~  last colonoscopy 8/09 by DrPerry> see above. ~  GYN= DrRichardson... pt states she is seen every other yr & Mammogram/ BMD at [facility]. ~  Immunizations:  Tetanus/ TDAP updated 2010;  Pneumovax in 2006 at age 49;  Given Prevnar-13 7/15;  she gets the Flu shots yearly...   Past Surgical History  Procedure Laterality Date  . Dilation and curettage of uterus    . Total thyroidectomy    . Polypectomy    . Colonoscopy    . Basal cell carcinoma removed from inside the left ear  11/04/2012    june 2015  . Eye surgery  08/22/2014    Outpatient Encounter Prescriptions as of 12/03/2015  Medication Sig  . amLODipine (NORVASC) 10 MG tablet Take 1 tablet (10 mg total) by mouth daily.  . Calcium Carbonate-Vitamin D (CALCIUM 600+D) 600-400 MG-UNIT  per tablet Take 1 tablet by mouth daily.   . cetirizine (ZYRTEC) 5 MG tablet Take 5 mg by mouth as needed.    . cholecalciferol (VITAMIN D) 1000 UNITS tablet Take 1,000 Units by mouth daily.  [name] dextromethorphan-guaiFENesin (MUCINEX DM) 30-600 MG per 12 hr tablet Take 1 tablet by mouth as needed.   . dipyridamole-aspirin (AGGRENOX) 200-25 MG 12hr capsule Take 1 capsule by mouth 2 (two) times daily.  . furosemide (LASIX) 20 MG tablet Take 1/2 daily as needed for swelling  . levothyroxine (SYNTHROID, LEVOTHROID) 100 MCG tablet Take 100 mcg by mouth daily.    . metoprolol succinate (TOPROL-XL) 50 MG 24 hr tablet TAKE 1 TABLET BY MOUTH EVERY DAY TAKE WITH OR IMMEDIATELY FOLLOWING A MEAL  . Multiple Vitamins-Minerals (CENTRUM SILVER PO) Take 1 tablet  by mouth daily.    . simvastatin (ZOCOR) 20 MG tablet Take 1 tablet (20 mg total) by mouth at bedtime.  . traMADol (ULTRAM) 50 MG tablet Take 1 tablet (50 mg total) by mouth 3 (three) times daily as needed.  . vitamin E 400 UNIT capsule Take 400 Units by mouth daily.  . [DISCONTINUED] dipyridamole-aspirin (AGGRENOX) 200-25 MG per 12 hr capsule Take 1 capsule by mouth 2 (two) times daily.  . [DISCONTINUED] furosemide (LASIX) 20 MG tablet Take 1/2 daily as needed for swelling  . [DISCONTINUED] metoprolol succinate (TOPROL-XL) 50 MG 24 hr tablet TAKE 1 TABLET BY MOUTH EVERY DAY TAKE WITH OR IMMEDIATELY FOLLOWING A MEAL  . [DISCONTINUED] simvastatin (ZOCOR) 20 MG tablet Take 1 tablet (20 mg total) by mouth at bedtime.  . dicyclomine (BENTYL) 20 MG tablet Take 1 tablet (20 mg total) by mouth 3 (three) times daily as needed (abdominal pain).  . Lactobacillus (PROBIOTIC ACIDOPHILUS PO) Take by mouth daily. Reported on 12/03/2015  . [DISCONTINUED] azithromycin (ZITHROMAX Z-PAK) 250 MG tablet Use as directed   Facility-Administered Encounter Medications as of 12/03/2015  Medication  . 0.9 %  sodium chloride infusion    Allergies  Allergen Reactions  . Sulfonamide Derivatives     REACTION: rash    Immunization History  Administered Date(s) Administered  . H1N1 10/09/2008  . Influenza Split 07/20/2011, 07/14/2012  . Influenza Whole 07/25/2008, 08/09/2009, 07/25/2010  . Influenza,inj,Quad PF,36+ Mos 07/26/2013, 07/30/2014, 07/30/2015  . Pneumococcal Conjugate-13 05/24/2014  . Pneumococcal Polysaccharide-23 11/02/2006  . Td 02/27/2009  . Zoster 11/02/2010    Current Medications, Allergies, Past Medical History, Past Surgical History, Family History, and Social History were reviewed in [employer] record.    Review of Systems        See HPI - all other systems neg except as noted... The patient denies anorexia, fever, weight loss, weight gain, vision loss, decreased  hearing, hoarseness, chest pain, syncope, dyspnea on exertion, peripheral edema, prolonged cough, headaches, hemoptysis, abdominal pain, melena, hematochezia, severe indigestion/heartburn, hematuria, incontinence, muscle weakness, suspicious skin lesions, transient blindness, difficulty walking, depression, unusual weight change, abnormal bleeding, enlarged lymph nodes, and angioedema.     Objective:   Physical Exam     WD, WN, 75 y/o WF in NAD... GENERAL:  Alert & oriented; pleasant & cooperative... HEENT:  [place]/AT, EOM-wnl, PERRLA, pterygium left eye, EACs-clear, TMs-wnl, NOSE-clear, THROAT-clear & wnl. NECK:  Supple w/ fairROM; no JVD; normal carotid impulses w/o bruits; scar of prev thyroid surg, no nodules, no adenopathy. CHEST:  Clear to P & A; without wheezes/ rales/ or rhonchi. HEART:  Regular Rhythm; without murmurs/ rubs/ or gallops. ABDOMEN:  Soft &  nontender; normal bowel sounds; no organomegaly or masses detected. EXT: without deformities or arthritic changes; no varicose veins/ venous insuffic/ or edema. NEURO:  CN's intact; motor testing normal; sensory testing normal; gait normal & balance OK. DERM:  s/p surg behind left pinna  RADIOLOGY DATA:  Reviewed in the EPIC EMR & discussed w/ the patient...  LABORATORY DATA:  Reviewed in the EPIC EMR & discussed w/ the patient...   Assessment & Plan:    HBP>  Controlled on meds, tol well, asymptomatic, continue same...  Cerebrovasc Dis & hx TIA>  She remains asymptomatic w/o cerebral ischemic symptoms, continue Agrennox...  HYPERLIPID>  Stable on diet + Simva20...  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  GI> Hx polyps>  She is up to date and had f/u colonoscopy 12/12 as above... 1/17>  Try Bentyl for IBS like symptoms...  Osteopenia>  Managed by GYN DrRichardson & pt reports that her last BMD was "excellent"...  Skin Cancer>  She reports Moh's surg for  BCE on nose, plus several "keratoses"; new lesion inside left pinna w/ surg pending...  Other medical issues as noted...   Patient's Medications  New Prescriptions   DICYCLOMINE (BENTYL) 20 MG TABLET    Take 1 tablet (20 mg total) by mouth 3 (three) times daily as needed (abdominal pain).  Previous Medications   AMLODIPINE (NORVASC) 10 MG TABLET    Take 1 tablet (10 mg total) by mouth daily.   CALCIUM CARBONATE-VITAMIN D (CALCIUM 600+D) 600-400 MG-UNIT PER TABLET    Take 1 tablet by mouth daily.    CETIRIZINE (ZYRTEC) 5 MG TABLET    Take 5 mg by mouth as needed.     CHOLECALCIFEROL (VITAMIN D) 1000 UNITS TABLET    Take 1,000 Units by mouth daily.   DEXTROMETHORPHAN-GUAIFENESIN (MUCINEX DM) 30-600 MG PER 12 HR TABLET    Take 1 tablet by mouth as needed.    LACTOBACILLUS (PROBIOTIC ACIDOPHILUS PO)    Take by mouth daily. Reported on 12/03/2015   LEVOTHYROXINE (SYNTHROID, LEVOTHROID) 100 MCG TABLET    Take 100 mcg by mouth daily.     MULTIPLE VITAMINS-MINERALS (CENTRUM SILVER PO)    Take 1 tablet by mouth daily.     TRAMADOL (ULTRAM) 50 MG TABLET    Take 1 tablet (50 mg total) by mouth 3 (three) times daily as needed.   VITAMIN E 400 UNIT CAPSULE    Take 400 Units by mouth daily.  Modified Medications   Modified Medication Previous Medication   DIPYRIDAMOLE-ASPIRIN (AGGRENOX) 200-25 MG 12HR CAPSULE dipyridamole-aspirin (AGGRENOX) 200-25 MG per 12 hr capsule      Take 1 capsule by mouth 2 (two) times daily.    Take 1 capsule by mouth 2 (two) times daily.   FUROSEMIDE (LASIX) 20 MG TABLET furosemide (LASIX) 20 MG tablet      Take 1/2 daily as needed for swelling    Take 1/2 daily as needed for swelling   METOPROLOL SUCCINATE (TOPROL-XL) 50 MG 24 HR TABLET metoprolol succinate (TOPROL-XL) 50 MG 24 hr tablet      TAKE 1 TABLET BY MOUTH EVERY DAY TAKE WITH OR IMMEDIATELY FOLLOWING A MEAL    TAKE 1 TABLET BY MOUTH EVERY DAY TAKE WITH OR IMMEDIATELY FOLLOWING A MEAL   SIMVASTATIN (ZOCOR) 20 MG TABLET  simvastatin (ZOCOR) 20 MG tablet      Take 1 tablet (20 mg total) by mouth at bedtime.  Take 1 tablet (20 mg total) by mouth at bedtime.  Discontinued Medications   AZITHROMYCIN (ZITHROMAX Z-PAK) 250 MG TABLET    Use as directed

## 2015-12-03 NOTE — Patient Instructions (Signed)
Today we updated your med list in our EPIC system...    Continue your current medications the same...  We added a trial of BENTYL (Dicyclomine) 20mg - take one tab up to 3 times daily as needed for the abdominal discomfort...    If the symptoms persist we will refer you to DrPerry for further evaluation...  Today we checked a follow up CXR, EKG, & FASTING blood work...    We will contact you w/ the results when available...   Call for any questions...  Let's plan a follow up visit in 33mo, sooner if needed for problems.Marland KitchenMarland Kitchen

## 2015-12-25 DIAGNOSIS — H9193 Unspecified hearing loss, bilateral: Secondary | ICD-10-CM | POA: Insufficient documentation

## 2015-12-25 DIAGNOSIS — H6123 Impacted cerumen, bilateral: Secondary | ICD-10-CM | POA: Insufficient documentation

## 2015-12-25 DIAGNOSIS — H9202 Otalgia, left ear: Secondary | ICD-10-CM | POA: Diagnosis not present

## 2015-12-25 DIAGNOSIS — R6884 Jaw pain: Secondary | ICD-10-CM | POA: Insufficient documentation

## 2016-01-06 ENCOUNTER — Telehealth: Payer: Self-pay | Admitting: Pulmonary Disease

## 2016-01-06 NOTE — Telephone Encounter (Signed)
Patient calling to change pharmacy information in system.  Changed pharmacy information. Patient states that she does not needs refills at this time. Nothing further needed.

## 2016-01-15 DIAGNOSIS — M8589 Other specified disorders of bone density and structure, multiple sites: Secondary | ICD-10-CM | POA: Diagnosis not present

## 2016-01-15 DIAGNOSIS — Z1231 Encounter for screening mammogram for malignant neoplasm of breast: Secondary | ICD-10-CM | POA: Diagnosis not present

## 2016-02-26 ENCOUNTER — Telehealth: Payer: Self-pay | Admitting: Pulmonary Disease

## 2016-02-26 MED ORDER — AMLODIPINE BESYLATE 10 MG PO TABS: 10.0000 mg | ORAL_TABLET | Freq: Every day | ORAL | Status: DC

## 2016-02-26 NOTE — Telephone Encounter (Signed)
Called spoke with pt. Informed her that we received a refill request for amlodipine. Verified [facility] as [facility]. She voiced understanding and had no further questions. Rx sent. Nothing further needed.

## 2016-03-12 ENCOUNTER — Other Ambulatory Visit: Payer: Self-pay | Admitting: Cardiology

## 2016-03-12 ENCOUNTER — Other Ambulatory Visit: Payer: Self-pay | Admitting: Pulmonary Disease

## 2016-03-12 DIAGNOSIS — I6523 Occlusion and stenosis of bilateral carotid arteries: Secondary | ICD-10-CM

## 2016-03-31 ENCOUNTER — Ambulatory Visit (HOSPITAL_COMMUNITY)
Admission: RE | Admit: 2016-03-31 | Discharge: 2016-03-31 | Disposition: A | Discharge status: Home / Self Care | Payer: Medicare Other | Source: Ambulatory Visit | Attending: Cardiology | Admitting: Cardiology

## 2016-03-31 DIAGNOSIS — I6523 Occlusion and stenosis of bilateral carotid arteries: Secondary | ICD-10-CM | POA: Insufficient documentation

## 2016-03-31 DIAGNOSIS — I10 Essential (primary) hypertension: Secondary | ICD-10-CM | POA: Diagnosis not present

## 2016-03-31 DIAGNOSIS — E785 Hyperlipidemia, unspecified: Secondary | ICD-10-CM | POA: Insufficient documentation

## 2016-04-09 DIAGNOSIS — Z85850 Personal history of malignant neoplasm of thyroid: Secondary | ICD-10-CM | POA: Diagnosis not present

## 2016-04-09 DIAGNOSIS — E890 Postprocedural hypothyroidism: Secondary | ICD-10-CM | POA: Diagnosis not present

## 2016-04-10 DIAGNOSIS — E890 Postprocedural hypothyroidism: Secondary | ICD-10-CM | POA: Diagnosis not present

## 2016-04-10 DIAGNOSIS — Z85850 Personal history of malignant neoplasm of thyroid: Secondary | ICD-10-CM | POA: Diagnosis not present

## 2016-04-10 DIAGNOSIS — M8580 Other specified disorders of bone density and structure, unspecified site: Secondary | ICD-10-CM | POA: Diagnosis not present

## 2016-05-28 DIAGNOSIS — Z85828 Personal history of other malignant neoplasm of skin: Secondary | ICD-10-CM | POA: Diagnosis not present

## 2016-05-28 DIAGNOSIS — L821 Other seborrheic keratosis: Secondary | ICD-10-CM | POA: Diagnosis not present

## 2016-06-01 ENCOUNTER — Ambulatory Visit (INDEPENDENT_AMBULATORY_CARE_PROVIDER_SITE_OTHER): Payer: Medicare Other | Admitting: Pulmonary Disease

## 2016-06-01 ENCOUNTER — Encounter: Payer: Self-pay | Admitting: Pulmonary Disease

## 2016-06-01 VITALS — BP 126/74 | HR 64 | Temp 98.4°F | Ht 61.0 in | Wt 127.8 lb

## 2016-06-01 DIAGNOSIS — I679 Cerebrovascular disease, unspecified: Secondary | ICD-10-CM

## 2016-06-01 DIAGNOSIS — I6523 Occlusion and stenosis of bilateral carotid arteries: Secondary | ICD-10-CM | POA: Diagnosis not present

## 2016-06-01 DIAGNOSIS — M899 Disorder of bone, unspecified: Secondary | ICD-10-CM

## 2016-06-01 DIAGNOSIS — M949 Disorder of cartilage, unspecified: Secondary | ICD-10-CM

## 2016-06-01 DIAGNOSIS — C73 Malignant neoplasm of thyroid gland: Secondary | ICD-10-CM | POA: Diagnosis not present

## 2016-06-01 DIAGNOSIS — G458 Other transient cerebral ischemic attacks and related syndromes: Secondary | ICD-10-CM

## 2016-06-01 DIAGNOSIS — I10 Essential (primary) hypertension: Secondary | ICD-10-CM | POA: Diagnosis not present

## 2016-06-01 DIAGNOSIS — D126 Benign neoplasm of colon, unspecified: Secondary | ICD-10-CM

## 2016-06-01 DIAGNOSIS — E785 Hyperlipidemia, unspecified: Secondary | ICD-10-CM

## 2016-06-01 DIAGNOSIS — E032 Hypothyroidism due to medicaments and other exogenous substances: Secondary | ICD-10-CM

## 2016-06-01 MED ORDER — AMLODIPINE BESYLATE 10 MG PO TABS: 10.0000 mg | ORAL_TABLET | Freq: Every day | ORAL | 3 refills | Status: DC

## 2016-06-01 MED ORDER — METOPROLOL SUCCINATE ER 50 MG PO TB24: ORAL_TABLET | ORAL | 5 refills | Status: DC

## 2016-06-01 MED ORDER — SIMVASTATIN 20 MG PO TABS
20.0000 mg | ORAL_TABLET | Freq: Every day | ORAL | 3 refills | Status: DC
Start: 2016-06-01 — End: 2016-12-03

## 2016-06-01 MED ORDER — ASPIRIN-DIPYRIDAMOLE ER 25-200 MG PO CP12: 1.0000 | ORAL_CAPSULE | Freq: Two times a day (BID) | ORAL | 3 refills | Status: DC

## 2016-06-01 MED ORDER — FUROSEMIDE 20 MG PO TABS: ORAL_TABLET | ORAL | 5 refills | Status: DC

## 2016-06-01 MED ORDER — DICYCLOMINE HCL 20 MG PO TABS: 20.0000 mg | ORAL_TABLET | Freq: Two times a day (BID) | ORAL | 5 refills | Status: DC | PRN

## 2016-06-01 NOTE — Patient Instructions (Signed)
Today we updated your med list in our EPIC system...    Continue your current medications the same...  We refilled your meds today...  Keep up the good work w/ diet & exercise...  Call for any questions...  Let's plan a follow up visit in 28mo, sooner if needed for problems.Marland KitchenMarland Kitchen

## 2016-06-15 ENCOUNTER — Telehealth: Payer: Self-pay | Admitting: Pulmonary Disease

## 2016-06-15 MED ORDER — CEFDINIR 300 MG PO CAPS: 300.0000 mg | ORAL_CAPSULE | Freq: Two times a day (BID) | ORAL | 0 refills | Status: DC

## 2016-06-15 MED ORDER — FIRST-DUKES MOUTHWASH MT SUSP: OROMUCOSAL | 0 refills | Status: DC

## 2016-06-15 NOTE — Telephone Encounter (Signed)
Called spoke with pt. Aware of recs. RX's sent in. Nothing further needed 

## 2016-06-15 NOTE — Telephone Encounter (Signed)
Spoke with pt. Reports sore throat, PND and cough since last Thursday. Denies running a fever. Has been gargling with warm salt water, taking Zyrtec and Mucinex with minimal relief. Would like to have something called in if possible.  Allergies  Allergen Reactions  . Sulfonamide Derivatives     REACTION: rash   SN - please advise. Thanks.

## 2016-06-15 NOTE — Telephone Encounter (Signed)
Per SN---  omnicef 300 mg  #14  1 po bid MMW  #4oz  1 tsp gargle and swallow QID prn

## 2016-08-01 ENCOUNTER — Encounter: Payer: Self-pay | Admitting: Pulmonary Disease

## 2016-08-01 NOTE — Progress Notes (Signed)
Subjective:    Patient ID: [name], female    DOB: 1941/05/24, 75 y.o.   MRN: [id number]  HPI 75 y/o WF here for a follow up visit... she has mult med problems as noted below> ~  SEE PREV EPIC NOTES FOR OLDER DATA >>    CXR 1/14 showed norm heart size, clear lungs-wnl, clips in lower neck from thyroid surg...  LABS 1/14: FLP- at goals on Simva20;  Chems- wnl;  CBC- wnl;  VitD= 69;  Thyroid per DrKerr...  LABS 1/15:  FLP- at goals on Simva20;  Chems- wnl;  CBC- wnl;  TSH=0.42 on Levo100...   ~  November 28, 2014:  35mo ROV & Keoshia tells me she had left eye surg 10/15 by DrMSpencer (?pterigium)) w/ post op hemorrhage prob due to Aggrenox rx;  She also had an URI 12/15 & called in- given Augmentin & Pred=> symptoms resolved... We reviewed the following medical problems during today's office visit >>     AR> on Zyrtek, Mucinex; stable on prn med rx...    HBP, MVP> on Metop50, Amlod10, Lasix20-1/2daily prn; BP= 128/78 & she denies CP, palpit, dizzy, SOB, edema, etc...    Cerebrovasc dis & TIA> on Aggrenox25-200Bid; she remains asymptomatic w/o cerebral ischemic symptoms...    CHOL> on Simva20; FLP 1/16 shows TChol 173, TG 143, HDL 51, LDL 93; continue same Rx & diet efforts...    Thyroid cancer & hypothy> on Synthroid100; followed by DrKerr Q1mo- seen 6/15 & doing well...    GI- colon polyps> followed by DrPerry w/ last colon 12/12 showing 2 sm adenomatous polyps removed & f/u planned 23yrs...    DJD right shoulder & LBP> off Vicodin & using Tramadol50 prn; she had eval DrDuda (given 2 shots in shoulder 2015 & improved) & ESI from [facility] in 2009; doing well w/o signif recurrent pain...    Osteopenia> on calcium, MVI, VitD; prev on Fosamax but stopped by GYN ?2008 & they follow her BMDs...    Skin Cancer> new- in left ext ear canal w/ surg planned by Derm 1/14...    Shingles> she had the shingles vaccine prev; developed rt leg rash/pain & eval by Ortho- DrHilts 12/13- treated w/ Valtrex,  Pred, Vicodin & resolved... We reviewed prob list, meds, xrays and labs> see below for updates >> she had the 2015 flu vaccine 9/15 7 is up to date on all vaccinations...   CXR 1/16 showed norm heart size, calcif in Ao, clear lungs, NAD...   LABS 1/16:  FLP- at goals on Simva20;  Chems- wnl;  CBC- wnl;  TSH=0.40 on synth100;  VitD=51 on 1000u daily...   ~  May 29, 2015:  65mo ROV & [name] is stable but has mult somatic complaints- notes another epidural steroid shot 5/16 by DrNewton after failing Pred (she states intol "it tore up nmy stomach"); under stress since her brother had a stroke;  She has some dysuria & wants a urinalysis thinking that the Pred gave her a UTI...    BP stable on Amlod10, MetopER50, Lasix20-1/2 prn; BP=138/72 & she denies HAs, CP, palpit, SOB, edema...     She remains on Aggrenox Bid w/o cerebral ischemic symptoms, stable...    Lipids have been at goal on diet + Simva20...    Thyroid stable on Synthroid100...    She takes calcium, Vits, probiotic, Tramadol prn... EXAM shows Afeb, VSS, O2sat=100% on RA;  HEENT- neg, mallampati2;  Chest- clear w/o w/r/r;  Heart- RR w/o m/r/g;  AQbd-  soft, nontender;  Ext- neg w/o c/c/e;  Neuro- neg, intact... IMP/PLAN>>  [name] is stable on current meds- refilled per request; she already had the 2016 Flu vaccine & up to date; ROV 83mo...  ~  December 03, 2015:  68mo [name] reports that she had a skin cancer removed from under her left eye at the skin cancer center DrAlbertini;  She has mult somatic complaints including left ear discomfort, & lower abd pain x several days- we discussed IBS & Rx trial w/ Bentyl vs referral to GI- DrPerry    AR> on Zyrtek, Mucinex; stable on prn med rx...    HBP, MVP> on Metop50, Amlod10, Lasix20-1/2daily prn; BP= 138/72 & she denies CP, palpit, dizzy, SOB, edema, etc...    Cerebrovasc dis & TIA> on Aggrenox25-200Bid; she remains asymptomatic w/o cerebral ischemic symptoms...    CHOL> on Simva20; FLP 1/17  shows TChol 168, TG 128, HDL 58, LDL 84; continue same Rx & diet efforts...    Thyroid cancer & hypothy> s/p thyroid surg, on Synthroid100; followed by DrKerr Q7mo & doing well; Labs here 1/17 showed TSH= 0.37    GI- colon polyps> followed by DrPerry w/ last colon 12/12 showing 2 sm adenomatous polyps removed & f/u planned 37yrs; c/o IBS like symptoms 1/17- trial Bentyl20 tid prn...    DJD right shoulder & LBP> off Vicodin & using Tramadol50 prn; she had eval DrDuda (given 2 shots in shoulder 2015 & improved) & ESI from [facility] in 2009; doing well w/o signif recurrent pain...    Osteopenia> on calcium, MVI, VitD; prev on Fosamax but stopped by GYN ?2008 & they follow her BMDs...    Skin Cancer> new- in left ext ear canal w/ surg by Payton Mccallum 1/14...    Shingles> she had the shingles vaccine prev; developed rt leg rash/pain & eval by Ortho- DrHilts 12/13- treated w/ Valtrex, Pred, Vicodin & resolved... EXAM shows Afeb, VSS, O2sat=98% on RA;  HEENT- neg x cerumen, mallampati2;  Chest- clear w/o w/r/r;  Heart- RR w/o m/r/g;  AQbd- soft, nontender;  Ext- neg w/o c/c/e;  Neuro- neg, intact...  CXR 12/03/15> norm heart size, aortic calcif is noted, clear lungs/ NAD.Marland KitchenMarland Kitchen   EKG 12/03/15>  NSR, rate64, wnl/ NAD....  LABS 12/03/15>  FLP- at goals on simva20- continue same;  Chems- wnl on diet alone;  CBC- wnl;  TSH=0.37 on Synthroid100 per drKerr;  VitD level = 56...  IMP/PLAN>>  [name] is stable- we'll try Bentyl for abd cramping & f/u w/ DrPerry if not responding;  Also needs ENT for cerumen impactions; due to Gyn check w/ DrRichardson...  ~  June 01, 2016:  16mo [place] reports that the Bentyl prescribed last OV really helped her IBS cramping & she continues using it Bid prn;  She reports otherw doing satis- no new complaints or concerns...     She remains on Aggrenox Bid, MetopER50, Amlod10, Lasiz20 prn; BP=126/74 & she denies CP, palpit, SOB, edema, and no cerebral ischemic symptoms.    FLP looks good on  Simva20 & diet...    She remains on synthroid100 per DrKerr 7 is clinically 7 biochem euthyroid...  EXAM shows Afeb, VSS, O2sat=98% on RA;  HEENT- neg x cerumen, mallampati2;  Chest- clear w/o w/r/r;  Heart- RR w/o m/r/g;  AQbd- soft, nontender;  Ext- neg w/o c/c/e;  Neuro- neg, intact... IMP/PLAN>>  Yoshie is stable, we reflled meds per request, continue same + diet/ exercise.Marland KitchenMarland Kitchen  Problem List:  ALLERGIC RHINITIS (ICD-477.9) - we discussed Rx w/ Zyrtek, Saline, Mucinex OTC...  HYPERTENSION (ICD-401.9) - on METOPROLOL XL 50mg /d, NORVASC 10mg /d, & LASIX 20mg - 1/2 tab daily...   ~  CXR 12/10 showed mild cardiomeg, clear lungs, NAD... ~  10/11:  BP=134/86, doing well w/ med + home BP checks... denies HA, fatigue, visual changes, CP, palipit, dizziness, syncope, dyspnea, edema, etc... ~  4/12:  BP= 120/76 & she continues to remain asymptomatic... ~  11/12:  BP= 122/72 & she continues stable w/o CP, palpit, dizzy, SOB, edema, or cerebral ischemic symptoms on her Aggrenox Bid. ~  5/13:  BP= 138/72 & she denies CP, palpit, SOB, edema, etc... ~  1/14: on Metop50, Amlod10, Lasix20-1/2daily; BP= 138/76 & she denies CP, palpit, dizzy, SOB, edema, etc... ~  CXR 1/14 showed normal heart size, clear lungs, surg clips in low neck, NAD.[name]. ~  7/14: BP is controlled on MetopER50-1/2 tab, Amlod10, and Lasix20-1/2tab daily; BP= 130/70 & she denies HA, dizzy, CP, palpit, SOB, edema, etc. ~  1/15: BP is regulated w/ MetopER50, Amlod10, Lasix20-1/2Qam; BP= 110/68 & she denies CP, palpit, SOB, edema, etc. ~  7/15: BP well controlled on Metop50, Amlod10, Lasix20-1/2 daily; BP= 130/70 & she denies CP, palpit, SOB, edema, or cerebral ischemic symptoms. ~  1/16: on Metop50, Amlod10, Lasix20-1/2daily prn; BP= 128/78 & she denies CP, palpit, dizzy, SOB, edema, etc. ~  1/17:  BP remains under good control...  MITRAL VALVE PROLAPSE (ICD-424.0) - clinical diagnosis in the past... no recent symptoms of CP or palpit...  can't find prev 2DEcho...  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 CDoppler's w/ non-obstructive plaque right carotid bulb, no signif ICA stenoses... ~  f/u CDoppler 5/10 showed mild plaque bilat, 0-39% blat ICA stenoses... f/u 56yr. ~  f/u CDoppler 5/11 showed mild plaque bilat, 0-39% bilat ICA stenoses, no change, f/u 90yr. ~  f/u CDoppler 4/12 showed mild plaque in bulbs, 0-39% bilat ICA stenoses, stable, repeat rec in 14yrs. ~  f/u CDopplers 4/14 showed mild heterogeneous plaque bilat, stable 0-39% bilat ICA stenoses, f/u 2 yrs... ~  f/u CDopplers 5/16 showed heterogeneous plaque bilat, progression of the right stenosis to 123456 range, stable LICA at [id number]; f/u 77yr...  HYPERLIPIDEMIA (ICD-272.4) - on SIMVASTATIN 20mg /d... she tried off meds 3/09 due to leg pains- but further eval showed HNP...  ~  pre-treatment FLP 3/04 showed TChol 282, TG 210, HDL 56, LDL 184... statin Rx started... ~  f/u FLP's on Statin Rx were good:  TChol 150-166, TG 87-119, HDL 35-49, LDL 86-107... ~  [place] 10/08 on Zocor20/d showed TChol 174, TG 136, HDL 47, LDL 99... ~  [place] 4/09 off Zocor showed TChol 285, TG 192, HDL 56, LDL 200... rec- restart Zocor20. ~  [place] 11/09 on Simva20 showed TChol 189, Tg 142, HDL 46, LDL 115... rec> continue same. ~  FLP 4/10 on Simva20 showed TChol 193, TG 114, HDL 55, LDL 116... may need incr dose. 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  ~  Endocrine eval by DrKerr 9/10 w/ radioactive iodine therapy- 106 mCi I- 131 given and scan showed only uptake in neck... ~  labs 4/10 showed TSH = 1.30 ~  9/10:  started on thyroid replacement by DrKerr- currently Synthroid 112 micrograms/d. ~  12/10: Synthroid decreased to 125mcg/d due to ?elevated BP?... labs followed by DrKerr. ~  9/11:  f/u DrKerr doing well- labs on SYNTHROID 161mcg/d= OK, and I-131 whole body scan reported neg. ~  4/12:  She continues regular f/u DrKerr> TSH= 0.36, Thyroid Ultrasound w/o recurrent or resid thyroid tissue seen... ~  4/13:  F/u DrKerr 4/13 w/ hx papillary thyroid cancer> s/p thyroidectomy & RAI therapy; on Levothy156mcg/d; ThySonar 4/13> no resid thyroid tissue or mass; "Everything was fine, no sign of cancer coming back" she says. ~  11/13: she saw DrKerr for 55mo ROV> papillary thyr ca, follicular variant- post surg hypothyroidism on Synthroid100; TSH=0.52; Throglob level remains undetectable... ~  Thyroid Ultrasound 6/14 showed no resid thyroid tissue & no adenopathy... ~  She continues regular Q67mo follow up visits w/ DrKerr...  COLONIC POLYPS (ICD-211.3) - colonoscopy 8/09 by DrPerry w/ several adenomatous polyps removed... f/u planned 5 yrs. ~  She saw DrPerry 12/12 w/ colonoscopy done> 2 polyps in asc colon= tubular adenomas & f/u suggested in 37yrs...  BACK PAIN, LUMBAR (ICD-724.2) - eval DrDuda 4/09 w/ HNP L5-S1 and treated w/ epid steroid shot by DrNewton and improved...  OSTEOPENIA (ICD-733.90) - prev on Fosamax but this was stopped by GYN after last BMD (2008) at [facility] showed improved BMD...  she takes Caltrate, Vits, Vit D... ~  labs 4/09 showed Vit D level = 33 therefore started Vit D OTC 11-1998 daly. ~  labs 4/10 showed Vit D level = 59 on 1000 u daily. ~  pt reports that f/u BMD 9/10 was "excellent"... ~  labs 4/11 showed Vit d level = 39... rec> continue 1000 u daily. 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 stable on AGGRENOX Bid... ~  4/12:  She continues on Aggrenox bid w/o cerebral ischemic symptoms...  Skin Cancer - BCE removed from left ankle by DrNolan 8/10... Another removed from nose & followed by Moh's surg... ~  She has had several Basal cells removed w/ subseq Moh's surg at the Skin cancer center...  Hx Shingles >> she's had the Shingles vaccine... ~  5/13:  She reports sudden right hip pain w/ eval DrHilts- Shingles Rx w/ Valtrex & Vicodin by him... ~  12/13: she had an outbreak on right leg w/ pain; went to her Ortho- DrHilts & treated w/ Valtrex, Pred, Vicodin and resolved...  Health Maintenance:   ~  last colonoscopy  8/09 by DrPerry> see above. ~  GYN= DrRichardson... pt states she is seen every other yr & Mammogram/ BMD at [facility]. ~  Immunizations:  Tetanus/ TDAP updated 2010;  Pneumovax in 2006 at age 21;  Given Prevnar-13 7/15;  she gets the Flu shots yearly...   Past Surgical History:  Procedure Laterality Date  . basal cell carcinoma removed from inside the left ear  11/04/2012   june 2015  . COLONOSCOPY    . DILATION AND CURETTAGE OF UTERUS    . EYE SURGERY  08/22/2014  . POLYPECTOMY    . TOTAL THYROIDECTOMY      Outpatient Encounter Prescriptions as of 06/01/2016  Medication Sig Dispense Refill  . amLODipine (NORVASC) 10 MG tablet Take 1 tablet (10 mg total) by mouth daily. 90 tablet 3  . Calcium Carbonate-Vitamin D  (CALCIUM 600+D) 600-400 MG-UNIT per tablet Take 1 tablet by mouth daily.     . cetirizine (ZYRTEC) 5 MG tablet Take 5 mg by mouth as needed.      . cholecalciferol (VITAMIN D) 1000 UNITS tablet Take 1,000 Units by mouth daily.    [name] dextromethorphan-guaiFENesin (MUCINEX DM) 30-600 MG per 12 hr tablet Take 1 tablet by mouth as needed.     . dicyclomine (BENTYL) 20 MG tablet Take 1 tablet (20 mg total) by mouth 2 (two) times daily as needed (abdominal cramping). 60 tablet 5  . dipyridamole-aspirin (AGGRENOX) 200-25 MG 12hr capsule Take 1 capsule by mouth 2 (two) times daily. 180 capsule 3  . furosemide (LASIX) 20 MG tablet Take 1/2 daily as needed for swelling 60 tablet 5  . levothyroxine (SYNTHROID, LEVOTHROID) 100 MCG tablet Take 100 mcg by mouth daily.      . metoprolol succinate (TOPROL-XL) 50 MG 24 hr tablet TAKE 1 TABLET BY MOUTH EVERY DAY TAKE WITH OR IMMEDIATELY FOLLOWING A MEAL 30 tablet 5  . Multiple Vitamins-Minerals (CENTRUM SILVER PO) Take 1 tablet by mouth daily.      . simvastatin (ZOCOR) 20 MG tablet Take 1 tablet (20 mg total) by mouth at bedtime. 90 tablet 3  . traMADol (ULTRAM) 50 MG tablet Take 1 tablet (50 mg total) by mouth 3 (three) times daily as needed. 90 tablet 5  . vitamin E 400 UNIT capsule Take 400 Units by mouth daily.    . [DISCONTINUED] amLODipine (NORVASC) 10 MG tablet Take 1 tablet (10 mg total) by mouth daily. 90 tablet 3  . [DISCONTINUED] dicyclomine (BENTYL) 20 MG tablet Take 1 tablet (20 mg total) by mouth 3 (three) times daily as needed (abdominal pain). 50 tablet 0  . [DISCONTINUED] dipyridamole-aspirin (AGGRENOX) 200-25 MG 12hr capsule Take 1 capsule by mouth 2 (two) times daily. 180 capsule 3  . [DISCONTINUED] furosemide (LASIX) 20 MG tablet Take 1/2 daily as needed for swelling 90 tablet 0  . [DISCONTINUED] metoprolol succinate (TOPROL-XL) 50 MG 24 hr tablet TAKE 1 TABLET BY MOUTH EVERY DAY TAKE WITH OR IMMEDIATELY FOLLOWING A MEAL 30 tablet 5  .  [DISCONTINUED] simvastatin (ZOCOR) 20 MG tablet Take 1 tablet (20 mg total) by mouth at bedtime. 90 tablet 3  . Lactobacillus (PROBIOTIC ACIDOPHILUS PO) Take by mouth daily. Reported on 12/03/2015     Facility-Administered Encounter Medications as of 06/01/2016  Medication Dose Route Frequency Provider Last Rate Last Dose  . 0.9 %  sodium chloride infusion  500 mL Intravenous Continuous Irene Shipper, MD        Allergies  Allergen Reactions  . Sulfonamide Derivatives  REACTION: rash    Immunization History  Administered Date(s) Administered  . H1N1 10/09/2008  . Influenza Split 07/20/2011, 07/14/2012  . Influenza Whole 07/25/2008, 08/09/2009, 07/25/2010  . Influenza,inj,Quad PF,36+ Mos 07/26/2013, 07/30/2014, 07/30/2015  . Pneumococcal Conjugate-13 05/24/2014  . Pneumococcal Polysaccharide-23 11/02/2006  . Td 02/27/2009  . Zoster 11/02/2010    Current Medications, Allergies, Past Medical History, Past Surgical History, Family History, and Social History were reviewed in [employer] record.    Review of Systems        See HPI - all other systems neg except as noted... The patient denies anorexia, fever, weight loss, weight gain, vision loss, decreased hearing, hoarseness, chest pain, syncope, dyspnea on exertion, peripheral edema, prolonged cough, headaches, hemoptysis, abdominal pain, melena, hematochezia, severe indigestion/heartburn, hematuria, incontinence, muscle weakness, suspicious skin lesions, transient blindness, difficulty walking, depression, unusual weight change, abnormal bleeding, enlarged lymph nodes, and angioedema.     Objective:   Physical Exam     WD, WN, 75 y/o WF in NAD... GENERAL:  Alert & oriented; pleasant & cooperative... HEENT:  [place]/AT, EOM-wnl, PERRLA, pterygium left eye, EACs-clear, TMs-wnl, NOSE-clear, THROAT-clear & wnl. NECK:  Supple w/ fairROM; no JVD; normal carotid impulses w/o bruits; scar of prev thyroid surg, no  nodules, no adenopathy. CHEST:  Clear to P & A; without wheezes/ rales/ or rhonchi. HEART:  Regular Rhythm; without murmurs/ rubs/ or gallops. ABDOMEN:  Soft & nontender; normal bowel sounds; no organomegaly or masses detected. EXT: without deformities or arthritic changes; no varicose veins/ venous insuffic/ or edema. NEURO:  CN's intact; motor testing normal; sensory testing normal; gait normal & balance OK. DERM:  s/p surg behind left pinna  RADIOLOGY DATA:  Reviewed in the EPIC EMR & discussed w/ the patient...  LABORATORY DATA:  Reviewed in the EPIC EMR & discussed w/ the patient...   Assessment & Plan:    HBP>  Controlled on meds, tol well, asymptomatic, continue same...  Cerebrovasc Dis & hx TIA>  She remains asymptomatic w/o cerebral ischemic symptoms, continue Agrennox...  HYPERLIPID>  Stable on diet + Simva20...  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  GI> Hx polyps>  She is up to date and had f/u colonoscopy 12/12 as above... 1/17>  Try Bentyl for IBS like symptoms...  Osteopenia>  Managed by GYN DrRichardson & pt reports that her last BMD was "excellent"...  Skin Cancer>  She reports Moh's surg for BCE on nose, plus several "keratoses"; new lesion inside left pinna w/ surg pending...  Other medical issues as noted...   Patient's Medications  New Prescriptions   CEFDINIR (OMNICEF) 300 MG CAPSULE    Take 1 capsule (300 mg total) by mouth 2 (two) times daily.   DIPHENHYD-HYDROCORT-NYSTATIN (FIRST-DUKES MOUTHWASH) SUSP    1 tsp gargle and swallow 4 times daily as needed  Previous Medications   CALCIUM CARBONATE-VITAMIN D (CALCIUM 600+D) 600-400 MG-UNIT PER TABLET    Take 1 tablet by mouth daily.    CETIRIZINE (ZYRTEC) 5 MG TABLET    Take 5 mg by mouth as needed.     CHOLECALCIFEROL (VITAMIN D) 1000 UNITS TABLET    Take 1,000 Units by mouth daily.   DEXTROMETHORPHAN-GUAIFENESIN (MUCINEX DM) 30-600  MG PER 12 HR TABLET    Take 1 tablet by mouth as needed.    LACTOBACILLUS (PROBIOTIC ACIDOPHILUS PO)    Take by mouth daily. Reported on 12/03/2015   LEVOTHYROXINE (SYNTHROID,  LEVOTHROID) 100 MCG TABLET    Take 100 mcg by mouth daily.     MULTIPLE VITAMINS-MINERALS (CENTRUM SILVER PO)    Take 1 tablet by mouth daily.     TRAMADOL (ULTRAM) 50 MG TABLET    Take 1 tablet (50 mg total) by mouth 3 (three) times daily as needed.   VITAMIN E 400 UNIT CAPSULE    Take 400 Units by mouth daily.  Modified Medications   Modified Medication Previous Medication   AMLODIPINE (NORVASC) 10 MG TABLET amLODipine (NORVASC) 10 MG tablet      Take 1 tablet (10 mg total) by mouth daily.    Take 1 tablet (10 mg total) by mouth daily.   DICYCLOMINE (BENTYL) 20 MG TABLET dicyclomine (BENTYL) 20 MG tablet      Take 1 tablet (20 mg total) by mouth 2 (two) times daily as needed (abdominal cramping).    Take 1 tablet (20 mg total) by mouth 3 (three) times daily as needed (abdominal pain).   DIPYRIDAMOLE-ASPIRIN (AGGRENOX) 200-25 MG 12HR CAPSULE dipyridamole-aspirin (AGGRENOX) 200-25 MG 12hr capsule      Take 1 capsule by mouth 2 (two) times daily.    Take 1 capsule by mouth 2 (two) times daily.   FUROSEMIDE (LASIX) 20 MG TABLET furosemide (LASIX) 20 MG tablet      Take 1/2 daily as needed for swelling    Take 1/2 daily as needed for swelling   METOPROLOL SUCCINATE (TOPROL-XL) 50 MG 24 HR TABLET metoprolol succinate (TOPROL-XL) 50 MG 24 hr tablet      TAKE 1 TABLET BY MOUTH EVERY DAY TAKE WITH OR IMMEDIATELY FOLLOWING A MEAL    TAKE 1 TABLET BY MOUTH EVERY DAY TAKE WITH OR IMMEDIATELY FOLLOWING A MEAL   SIMVASTATIN (ZOCOR) 20 MG TABLET simvastatin (ZOCOR) 20 MG tablet      Take 1 tablet (20 mg total) by mouth at bedtime.    Take 1 tablet (20 mg total) by mouth at bedtime.  Discontinued Medications   No medications on file

## 2016-08-06 ENCOUNTER — Telehealth: Payer: Self-pay | Admitting: Pulmonary Disease

## 2016-08-06 NOTE — Telephone Encounter (Signed)
PA initated for the dicyclomine.  Paper has been completed and placed on SN cart to be signed.  Will fax once this is completed.

## 2016-08-11 NOTE — Telephone Encounter (Signed)
Form signed by SN and faxed back today.

## 2016-08-18 NOTE — Telephone Encounter (Signed)
Received the approval of dicyclomine from [employer].  This will be scanned into the pts chart.   Approved from 11/01/2015 Expiration date 11/01/2016  Called and spoke with the pharmacy at [facility] and they are aware.

## 2016-08-18 NOTE — Telephone Encounter (Signed)
Have we received a determination back yet? Thanks.

## 2016-08-21 ENCOUNTER — Ambulatory Visit (INDEPENDENT_AMBULATORY_CARE_PROVIDER_SITE_OTHER): Payer: Medicare Other

## 2016-08-21 DIAGNOSIS — Z23 Encounter for immunization: Secondary | ICD-10-CM

## 2016-09-02 DIAGNOSIS — Z01419 Encounter for gynecological examination (general) (routine) without abnormal findings: Secondary | ICD-10-CM | POA: Diagnosis not present

## 2016-09-02 DIAGNOSIS — Z6823 Body mass index (BMI) 23.0-23.9, adult: Secondary | ICD-10-CM | POA: Diagnosis not present

## 2016-09-02 DIAGNOSIS — Z1389 Encounter for screening for other disorder: Secondary | ICD-10-CM | POA: Diagnosis not present

## 2016-09-02 DIAGNOSIS — Z124 Encounter for screening for malignant neoplasm of cervix: Secondary | ICD-10-CM | POA: Diagnosis not present

## 2016-09-03 DIAGNOSIS — H2511 Age-related nuclear cataract, right eye: Secondary | ICD-10-CM | POA: Diagnosis not present

## 2016-09-03 DIAGNOSIS — H2513 Age-related nuclear cataract, bilateral: Secondary | ICD-10-CM | POA: Diagnosis not present

## 2016-09-03 DIAGNOSIS — H52203 Unspecified astigmatism, bilateral: Secondary | ICD-10-CM | POA: Diagnosis not present

## 2016-09-03 DIAGNOSIS — H524 Presbyopia: Secondary | ICD-10-CM | POA: Diagnosis not present

## 2016-09-04 ENCOUNTER — Encounter: Payer: Self-pay | Admitting: Internal Medicine

## 2016-09-09 DIAGNOSIS — H2512 Age-related nuclear cataract, left eye: Secondary | ICD-10-CM | POA: Diagnosis not present

## 2016-09-09 DIAGNOSIS — H2511 Age-related nuclear cataract, right eye: Secondary | ICD-10-CM | POA: Diagnosis not present

## 2016-09-16 DIAGNOSIS — H2512 Age-related nuclear cataract, left eye: Secondary | ICD-10-CM | POA: Diagnosis not present

## 2016-10-07 ENCOUNTER — Telehealth: Payer: Self-pay | Admitting: Pulmonary Disease

## 2016-10-07 MED ORDER — AMOXICILLIN-POT CLAVULANATE 875-125 MG PO TABS: 1.0000 | ORAL_TABLET | Freq: Two times a day (BID) | ORAL | 0 refills | Status: DC

## 2016-10-07 MED ORDER — FIRST-DUKES MOUTHWASH MT SUSP: OROMUCOSAL | 0 refills | Status: DC

## 2016-10-07 NOTE — Telephone Encounter (Signed)
Spoke with pt. She is aware of SN's recommendations. Rxs have been sent in. Nothing further was needed. 

## 2016-10-07 NOTE — Telephone Encounter (Signed)
Per SN---  augmentin 875 #14  1 po bid Align once daily  Magic mouth wash  4 oz 1 tsp gargle and swallow four times daily

## 2016-10-07 NOTE — Telephone Encounter (Signed)
Spoke with the pt  She is c/o hoarseness, PND, cough and chest tightness for the past 5 days  She states that she is producing some yellow to green sputum "from my throat" Denies any f/c/s, wheezing or SOB  Please advise thanks! Allergies  Allergen Reactions  . Sulfonamide Derivatives     REACTION: rash

## 2016-12-03 ENCOUNTER — Encounter: Payer: Self-pay | Admitting: Pulmonary Disease

## 2016-12-03 ENCOUNTER — Other Ambulatory Visit (INDEPENDENT_AMBULATORY_CARE_PROVIDER_SITE_OTHER): Payer: Medicare Other

## 2016-12-03 ENCOUNTER — Ambulatory Visit (INDEPENDENT_AMBULATORY_CARE_PROVIDER_SITE_OTHER): Payer: Medicare Other | Admitting: Pulmonary Disease

## 2016-12-03 VITALS — BP 118/64 | HR 65 | Temp 97.1°F | Ht 61.0 in | Wt 130.0 lb

## 2016-12-03 DIAGNOSIS — G459 Transient cerebral ischemic attack, unspecified: Secondary | ICD-10-CM

## 2016-12-03 DIAGNOSIS — E032 Hypothyroidism due to medicaments and other exogenous substances: Secondary | ICD-10-CM

## 2016-12-03 DIAGNOSIS — M899 Disorder of bone, unspecified: Secondary | ICD-10-CM

## 2016-12-03 DIAGNOSIS — M949 Disorder of cartilage, unspecified: Secondary | ICD-10-CM | POA: Diagnosis not present

## 2016-12-03 DIAGNOSIS — I10 Essential (primary) hypertension: Secondary | ICD-10-CM

## 2016-12-03 DIAGNOSIS — C73 Malignant neoplasm of thyroid gland: Secondary | ICD-10-CM

## 2016-12-03 DIAGNOSIS — E782 Mixed hyperlipidemia: Secondary | ICD-10-CM

## 2016-12-03 DIAGNOSIS — D126 Benign neoplasm of colon, unspecified: Secondary | ICD-10-CM | POA: Diagnosis not present

## 2016-12-03 DIAGNOSIS — I679 Cerebrovascular disease, unspecified: Secondary | ICD-10-CM

## 2016-12-03 DIAGNOSIS — E559 Vitamin D deficiency, unspecified: Secondary | ICD-10-CM

## 2016-12-03 LAB — CBC WITH DIFFERENTIAL/PLATELET
BASOS ABS: 0 10*3/uL (ref 0.0–0.1)
Basophils Relative: 0.6 % (ref 0.0–3.0)
Eosinophils Absolute: 0.1 10*3/uL (ref 0.0–0.7)
Eosinophils Relative: 2.3 % (ref 0.0–5.0)
HEMATOCRIT: 42.4 % (ref 36.0–46.0)
Hemoglobin: 14.2 g/dL (ref 12.0–15.0)
LYMPHS PCT: 20.8 % (ref 12.0–46.0)
Lymphs Abs: 1.3 10*3/uL (ref 0.7–4.0)
MCHC: 33.6 g/dL (ref 30.0–36.0)
MCV: 90 fl (ref 78.0–100.0)
MONOS PCT: 8.7 % (ref 3.0–12.0)
Monocytes Absolute: 0.5 10*3/uL (ref 0.1–1.0)
NEUTROS ABS: 4.3 10*3/uL (ref 1.4–7.7)
Neutrophils Relative %: 67.6 % (ref 43.0–77.0)
Platelets: 298 10*3/uL (ref 150.0–400.0)
RBC: 4.71 Mil/uL (ref 3.87–5.11)
RDW: 13.3 % (ref 11.5–15.5)
WBC: 6.3 10*3/uL (ref 4.0–10.5)

## 2016-12-03 LAB — COMPREHENSIVE METABOLIC PANEL
ALBUMIN: 4.6 g/dL (ref 3.5–5.2)
ALK PHOS: 61 U/L (ref 39–117)
ALT: 20 U/L (ref 0–35)
AST: 31 U/L (ref 0–37)
BILIRUBIN TOTAL: 0.6 mg/dL (ref 0.2–1.2)
BUN: 17 mg/dL (ref 6–23)
CALCIUM: 9.6 mg/dL (ref 8.4–10.5)
CO2: 30 mEq/L (ref 19–32)
Chloride: 102 mEq/L (ref 96–112)
Creatinine, Ser: 0.65 mg/dL (ref 0.40–1.20)
GFR: 93.98 mL/min (ref >60.00)
GLUCOSE: 103 mg/dL — AB (ref 70–99)
Potassium: 4.3 mEq/L (ref 3.5–5.1)
Sodium: 139 mEq/L (ref 135–145)
TOTAL PROTEIN: 7.1 g/dL (ref 6.0–8.3)

## 2016-12-03 LAB — VITAMIN D 25 HYDROXY (VIT D DEFICIENCY, FRACTURES): VITD: 50.7 ng/mL (ref 30.00–100.00)

## 2016-12-03 LAB — LIPID PANEL
Cholesterol: 165 mg/dL (ref 0–200)
HDL: 50.5 mg/dL (ref >39.00)
LDL Cholesterol: 92 mg/dL (ref 0–99)
NONHDL: 114
Total CHOL/HDL Ratio: 3
Triglycerides: 109 mg/dL (ref 0.0–149.0)
VLDL: 21.8 mg/dL (ref 0.0–40.0)

## 2016-12-03 LAB — TSH: TSH: 0.48 u[IU]/mL (ref 0.35–4.50)

## 2016-12-03 MED ORDER — METOPROLOL SUCCINATE ER 50 MG PO TB24: ORAL_TABLET | ORAL | 3 refills | Status: DC

## 2016-12-03 MED ORDER — FUROSEMIDE 20 MG PO TABS: ORAL_TABLET | ORAL | 5 refills | Status: DC

## 2016-12-03 MED ORDER — ASPIRIN-DIPYRIDAMOLE ER 25-200 MG PO CP12: 1.0000 | ORAL_CAPSULE | Freq: Two times a day (BID) | ORAL | 3 refills | Status: DC

## 2016-12-03 MED ORDER — AMLODIPINE BESYLATE 10 MG PO TABS: 10.0000 mg | ORAL_TABLET | Freq: Every day | ORAL | 3 refills | Status: DC

## 2016-12-03 MED ORDER — SIMVASTATIN 20 MG PO TABS: 20.0000 mg | ORAL_TABLET | Freq: Every day | ORAL | 3 refills | Status: DC

## 2016-12-03 NOTE — Patient Instructions (Signed)
Today we updated your med list in our EPIC system...    Continue your current medications the same...  We refilled your meds per request...  Today we did your follow up FASTING blood work...    We will contact you w/ the results when available...   You are due for a follow up COLONOSCOPY w/ DrPerry...    We will notify his office to contact you for a follow up...  Call for any questions...  Let's plan a brief follow up visit in 109mo, sooner if needed for problems.Marland KitchenMarland Kitchen

## 2016-12-03 NOTE — Progress Notes (Signed)
Subjective:    Patient ID: [name], female    DOB: 1941/03/24, 76 y.o.   MRN: [id number]  HPI 76 y/o WF here for a follow up visit... she has mult med problems as noted below> ~  SEE PREV EPIC NOTES FOR OLDER DATA >>     CXR 1/14 showed norm heart size, clear lungs-wnl, clips in lower neck from thyroid surg...  LABS 1/14: FLP- at goals on Simva20;  Chems- wnl;  CBC- wnl;  VitD= 69;  Thyroid per DrKerr...  LABS 1/15:  FLP- at goals on Simva20;  Chems- wnl;  CBC- wnl;  TSH=0.42 on Levo100...    ~  November 28, 2014:  66mo ROV & Mailani tells me she had left eye surg 10/15 by DrMSpencer (?pterigium)) w/ post op hemorrhage prob due to Aggrenox rx;  She also had an URI 12/15 & called in- given Augmentin & Pred=> symptoms resolved... We reviewed the following medical problems during today's office visit >>     AR> on Zyrtek, Mucinex; stable on prn med rx...    HBP, MVP> on Metop50, Amlod10, Lasix20-1/2daily prn; BP= 128/78 & she denies CP, palpit, dizzy, SOB, edema, etc...    Cerebrovasc dis & TIA> on Aggrenox25-200Bid; she remains asymptomatic w/o cerebral ischemic symptoms...    CHOL> on Simva20; FLP 1/16 shows TChol 173, TG 143, HDL 51, LDL 93; continue same Rx & diet efforts...    Thyroid cancer & hypothy> on Synthroid100; followed by DrKerr Q16mo- seen 6/15 & doing well...    GI- colon polyps> followed by DrPerry w/ last colon 12/12 showing 2 sm adenomatous polyps removed & f/u planned 75yrs...    DJD right shoulder & LBP> off Vicodin & using Tramadol50 prn; she had eval DrDuda (given 2 shots in shoulder 2015 & improved) & ESI from [facility] in 2009; doing well w/o signif recurrent pain...    Osteopenia> on calcium, MVI, VitD; prev on Fosamax but stopped by GYN ?2008 & they follow her BMDs...    Skin Cancer> new- in left ext ear canal w/ surg planned by Derm 1/14...    Shingles> she had the shingles vaccine prev; developed rt leg rash/pain & eval by Ortho- DrHilts 12/13- treated w/  Valtrex, Pred, Vicodin & resolved... We reviewed prob list, meds, xrays and labs> see below for updates >> she had the 2015 flu vaccine 9/15 7 is up to date on all vaccinations...   CXR 1/16 showed norm heart size, calcif in Ao, clear lungs, NAD...   LABS 1/16:  FLP- at goals on Simva20;  Chems- wnl;  CBC- wnl;  TSH=0.40 on synth100;  VitD=51 on 1000u daily...   ~  May 29, 2015:  22mo ROV & Bona is stable but has mult somatic complaints- notes another epidural steroid shot 5/16 by DrNewton after failing Pred (she states intol "it tore up nmy stomach"); under stress since her brother had a stroke;  She has some dysuria & wants a urinalysis thinking that the Pred gave her a UTI...    BP stable on Amlod10, MetopER50, Lasix20-1/2 prn; BP=138/72 & she denies HAs, CP, palpit, SOB, edema...     She remains on Aggrenox Bid w/o cerebral ischemic symptoms, stable...    Lipids have been at goal on diet + Simva20...    Thyroid stable on Synthroid100...    She takes calcium, Vits, probiotic, Tramadol prn... EXAM shows Afeb, VSS, O2sat=100% on RA;  HEENT- neg, mallampati2;  Chest- clear w/o w/r/r;  Heart- RR w/o m/r/g;  AQbd- soft, nontender;  Ext- neg w/o c/c/e;  Neuro- neg, intact... IMP/PLAN>>  Eulene is stable on current meds- refilled per request; she already had the 2016 Flu vaccine & up to date; ROV 17mo...  ~  December 03, 2015:  26mo Collins reports that she had a skin cancer removed from under her left eye at the skin cancer center DrAlbertini;  She has mult somatic complaints including left ear discomfort, & lower abd pain x several days- we discussed IBS & Rx trial w/ Bentyl vs referral to GI- DrPerry    AR> on Zyrtek, Mucinex; stable on prn med rx...    HBP, MVP> on Metop50, Amlod10, Lasix20-1/2daily prn; BP= 138/72 & she denies CP, palpit, dizzy, SOB, edema, etc...    Cerebrovasc dis & TIA> on Aggrenox25-200Bid; she remains asymptomatic w/o cerebral ischemic symptoms...    CHOL> on Simva20;  FLP 1/17 shows TChol 168, TG 128, HDL 58, LDL 84; continue same Rx & diet efforts...    Thyroid cancer & hypothy> s/p thyroid surg, on Synthroid100; followed by DrKerr Q38mo & doing well; Labs here 1/17 showed TSH= 0.37    GI- colon polyps> followed by DrPerry w/ last colon 12/12 showing 2 sm adenomatous polyps removed & f/u planned 16yrs; c/o IBS like symptoms 1/17- trial Bentyl20 tid prn...    DJD right shoulder & LBP> off Vicodin & using Tramadol50 prn; she had eval DrDuda (given 2 shots in shoulder 2015 & improved) & ESI from [facility] in 2009; doing well w/o signif recurrent pain...    Osteopenia> on calcium, MVI, VitD; prev on Fosamax but stopped by GYN ?2008 & they follow her BMDs...    Skin Cancer> new- in left ext ear canal w/ surg by Payton Mccallum 1/14...    Shingles> she had the shingles vaccine prev; developed rt leg rash/pain & eval by Ortho- DrHilts 12/13- treated w/ Valtrex, Pred, Vicodin & resolved... EXAM shows Afeb, VSS, O2sat=98% on RA;  HEENT- neg x cerumen, mallampati2;  Chest- clear w/o w/r/r;  Heart- RR w/o m/r/g;  AQbd- soft, nontender;  Ext- neg w/o c/c/e;  Neuro- neg, intact...  CXR 12/03/15> norm heart size, aortic calcif is noted, clear lungs/ NAD.Marland KitchenMarland Kitchen   EKG 12/03/15>  NSR, rate64, wnl/ NAD....  LABS 12/03/15>  FLP- at goals on simva20- continue same;  Chems- wnl on diet alone;  CBC- wnl;  TSH=0.37 on Synthroid100 per drKerr;  VitD level = 56...  IMP/PLAN>>  Swara is stable- we'll try Bentyl for abd cramping & f/u w/ DrPerry if not responding;  Also needs ENT for cerumen impactions; due to Gyn check w/ DrRichardson...  ~  June 01, 2016:  63mo Huntley reports that the Bentyl prescribed last OV really helped her IBS cramping & she continues using it Bid prn;  She reports otherw doing satis- no new complaints or concerns...     She remains on Aggrenox Bid, MetopER50, Amlod10, Lasiz20 prn; BP=126/74 & she denies CP, palpit, SOB, edema, and no cerebral ischemic symptoms.    FLP looks  good on Simva20 & diet...    She remains on Synthroid100 per DrKerr & is clinically & biochem euthyroid...  EXAM shows Afeb, VSS, O2sat=98% on RA;  HEENT- neg x cerumen, mallampati2;  Chest- clear w/o w/r/r;  Heart- RR w/o m/r/g;  AQbd- soft, nontender;  Ext- neg w/o c/c/e;  Neuro- neg, intact... IMP/PLAN>>  Gennetta is stable, we reflled meds per request, continue same + diet/ exercise...    ~  December 03, 2016:  52mo ROV & Ketura reports a stable interval- no new complaints or concerns;  She's had Bilat cataract surg in [id number] by DrShapiro & doing satis;  She saw GYN- DrRichardson 09/2016 & stable- Mammogram was neg w/ dense breasts and BMD (last 01/2016) showed lowest Tscore = -2.2 in left [facility], they are following... We reviewed the following medical problems during today's office visit >>     AR> on Zyrtek, Mucinex; stable on prn med rx...    HBP, MVP> on Metop50, Amlod10, Lasix20-1/2daily prn; BP= 118/64 & she denies CP, palpit, dizzy, SOB, edema, etc...    Cerebrovasc dis & TIA> on Aggrenox25-200Bid; she remains asymptomatic w/o cerebral ischemic symptoms...    CHOL> on Simva20; FLP 1/18 shows TChol 165, TG 109, HDL 51, LDL 92; continue same Rx & diet efforts...    Thyroid cancer & hypothy> s/p thyroid surg, on Synthroid100; followed by DrKerr Q36mo & doing well; Labs here 1/18 showed TSH= 0.48    GI- colon polyps> followed by DrPerry w/ last colon 12/12 showing 2 sm adenomatous polyps removed & f/u planned 49yrs; c/o IBS like symptoms 1/17- trial Bentyl20 tid prn; she will call GI for f/u colon...Marland KitchenMarland KitchenMarland Kitchen    DJD right shoulder & LBP> off Vicodin & using Tramadol50 prn; she had eval DrDuda (given 2 shots in shoulder 2015 & improved) & ESI from [facility] in 2009; doing well w/o signif recurrent pain...    Osteopenia> on calcium, MVI, VitD; prev on Fosamax but stopped by GYN ?2008 & they follow her BMDs=> last 3/17 showing lowest Tscore -2.2 in left FemNeck on Calcium, MVI, VitD, wt bearing exercise  (consider Prolia)......    Skin Cancer> new- in left ext ear canal w/ surg by Payton Mccallum 1/14...    Shingles> she had the shingles vaccine prev; developed rt leg rash/pain & eval by Ortho- DrHilts 12/13- treated w/ Valtrex, Pred, Vicodin & resolved... EXAM shows Afeb, VSS, O2sat=99% on RA;  HEENT- neg x cerumen, mallampati2;  Chest- clear w/o w/r/r;  Heart- RR Gr1/6 SEM w/o r/g;  Abd- soft, nontender;  Ext- neg w/o c/c/e;  Neuro- neg, intact...  LABS 12/03/16:  FLP- all parameters at goals on Simva20;  Chems- wnl;  CBC- wnl;  TSH=0.48;  VitD=51;   IMP/PLAN>>Marja remains stable overall, rec to continue same meds (refilled today per request), she is due for a colonoscopy w/ DrPerry & we will refer; she knows to call for any problems otherw follow up in ~43mo...         Problem List:  ALLERGIC RHINITIS (ICD-477.9) - we discussed Rx w/ Zyrtek, Saline, Mucinex OTC...  HYPERTENSION (ICD-401.9) - on METOPROLOL XL 50mg /d, NORVASC 10mg /d, & LASIX 20mg - 1/2 tab daily...   ~  CXR 12/10 showed mild cardiomeg, clear lungs, NAD... ~  10/11:  BP=134/86, doing well w/ med + home BP checks... denies HA, fatigue, visual changes, CP, palipit, dizziness, syncope, dyspnea, edema, etc... ~  4/12:  BP= 120/76 & she continues to remain asymptomatic... ~  11/12:  BP= 122/72 & she continues stable w/o CP, palpit, dizzy, SOB, edema, or cerebral ischemic symptoms on her Aggrenox Bid. ~  5/13:  BP= 138/72 & she denies CP, palpit, SOB, edema, etc... ~  1/14: on Metop50, Amlod10, Lasix20-1/2daily; BP= 138/76 & she denies CP, palpit, dizzy, SOB, edema, etc... ~  CXR 1/14 showed normal heart size, clear lungs, surg clips in low neck, NAD.[name]. ~  7/14: BP is controlled on MetopER50-1/2 tab, Amlod10, and Lasix20-1/2tab daily; BP= 130/70 &  she denies HA, dizzy, CP, palpit, SOB, edema, etc. ~  1/15: BP is regulated w/ MetopER50, Amlod10, Lasix20-1/2Qam; BP= 110/68 & she denies CP, palpit, SOB, edema, etc. ~  7/15: BP well controlled on  Metop50, Amlod10, Lasix20-1/2 daily; BP= 130/70 & she denies CP, palpit, SOB, edema, or cerebral ischemic symptoms. ~  1/16: on Metop50, Amlod10, Lasix20-1/2daily prn; BP= 128/78 & she denies CP, palpit, dizzy, SOB, edema, etc. ~  1/17:  BP remains under good control...  MITRAL VALVE PROLAPSE (ICD-424.0) - clinical diagnosis in the past... no recent symptoms of CP or palpit... can't find prev 2DEcho...  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 CDoppler's w/ non-obstructive plaque right carotid bulb, no signif ICA stenoses... ~  f/u CDoppler 5/10 showed mild plaque bilat, 0-39% blat ICA stenoses... f/u 50yr. ~  f/u CDoppler 5/11 showed mild plaque bilat, 0-39% bilat ICA stenoses, no change, f/u 75yr. ~  f/u CDoppler 4/12 showed mild plaque in bulbs, 0-39% bilat ICA stenoses, stable, repeat rec in 72yrs. ~  f/u CDopplers 4/14 showed mild heterogeneous plaque bilat, stable 0-39% bilat ICA stenoses, f/u 2 yrs... ~  f/u CDopplers 5/16 showed heterogeneous plaque bilat, progression of the right stenosis to 123456 range, stable LICA at [id number]; f/u 3yr...  HYPERLIPIDEMIA (ICD-272.4) - on SIMVASTATIN 20mg /d... she tried off meds 3/09 due to leg pains- but further eval showed HNP...  ~  pre-treatment FLP 3/04 showed TChol 282, TG 210, HDL 56, LDL 184... statin Rx started... ~  f/u FLP's on Statin Rx were good:  TChol 150-166, TG 87-119, HDL 35-49, LDL 86-107... ~  [place] 10/08 on Zocor20/d showed TChol 174, TG 136, HDL 47, LDL 99... ~  [place] 4/09 off Zocor showed TChol 285, TG 192, HDL 56, LDL 200... rec- restart Zocor20. ~  [place] 11/09 on Simva20 showed TChol 189, Tg 142, HDL 46, LDL 115... rec> continue same. ~  FLP 4/10 on Simva20 showed TChol 193, TG 114, HDL 55, LDL 116... may need incr dose. 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  ~  Endocrine eval by DrKerr 9/10 w/ radioactive iodine therapy- 106 mCi I- 131 given and scan showed only uptake in neck... ~  labs 4/10 showed TSH = 1.30 ~  9/10:  started on thyroid replacement by DrKerr- currently Synthroid 112 micrograms/d. ~  12/10: Synthroid decreased to 12mcg/d due to ?elevated BP?... labs followed by DrKerr. ~  9/11:  f/u DrKerr doing well- labs on SYNTHROID 122mcg/d=  OK, and I-131 whole body scan reported neg. ~  4/12:  She continues regular f/u DrKerr> TSH= 0.36, Thyroid Ultrasound w/o recurrent or resid thyroid tissue seen... ~  4/13:  F/u DrKerr 4/13 w/ hx papillary thyroid cancer> s/p thyroidectomy & RAI therapy; on Levothy193mcg/d; ThySonar 4/13> no resid thyroid tissue or mass; "Everything was fine, no sign of cancer coming back" she says. ~  11/13: she saw DrKerr for 80mo ROV> papillary thyr ca, follicular variant- post surg hypothyroidism on Synthroid100; TSH=0.52; Throglob level remains undetectable... ~  Thyroid Ultrasound 6/14 showed no resid thyroid tissue & no adenopathy... ~  She continues regular Q66mo follow up visits w/ DrKerr...  COLONIC POLYPS (ICD-211.3) - colonoscopy 8/09 by DrPerry w/ several adenomatous polyps removed... f/u  planned 5 yrs. ~  She saw DrPerry 12/12 w/ colonoscopy done> 2 polyps in asc colon= tubular adenomas & f/u suggested in 55yrs...  BACK PAIN, LUMBAR (ICD-724.2) - eval DrDuda 4/09 w/ HNP L5-S1 and treated w/ epid steroid shot by DrNewton and improved...  OSTEOPENIA (ICD-733.90) - prev on Fosamax but this was stopped by GYN after last BMD (2008) at [facility] showed improved BMD... she takes Caltrate, Vits, Vit D... ~  labs 4/09 showed Vit D level = 33 therefore started Vit D OTC 11-1998 daly. ~  labs 4/10 showed Vit D level = 59 on 1000 u daily. ~  pt reports that f/u BMD 9/10 was "excellent"... ~  labs 4/11 showed Vit d level = 39... rec> continue 1000 u daily. 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 stable on AGGRENOX Bid... ~  4/12:  She continues on Aggrenox bid w/o cerebral ischemic symptoms...  Skin Cancer - BCE removed from left ankle by DrNolan 8/10... Another removed from nose & followed by Moh's surg... ~  She has had several Basal cells removed w/ subseq Moh's surg at the Skin cancer center...  Hx Shingles >> she's had the Shingles vaccine... ~  5/13:  She reports sudden right hip pain w/ eval DrHilts- Shingles Rx w/ Valtrex & Vicodin by him... ~  12/13: she had an outbreak on right leg w/ pain; went to her Ortho- DrHilts & treated w/ Valtrex, Pred, Vicodin and resolved...  Health Maintenance:   ~  last colonoscopy 8/09 by DrPerry> see above. ~  GYN= DrRichardson... pt states she is seen every other yr & Mammogram/ BMD at [facility]. ~  Immunizations:  Tetanus/ TDAP updated 2010;  Pneumovax in 2006 at age 57;  Given Prevnar-13 7/15;  she gets the Flu shots yearly...   Past Surgical History:  Procedure Laterality Date  . basal cell carcinoma removed from  inside the left ear  11/04/2012   june 2015  . COLONOSCOPY    . DILATION AND CURETTAGE OF UTERUS    . EYE SURGERY  08/22/2014  . POLYPECTOMY    . TOTAL THYROIDECTOMY      Outpatient Encounter Prescriptions as of 12/03/2016  Medication Sig Dispense Refill  . amLODipine (NORVASC) 10 MG tablet Take 1 tablet (10 mg total) by mouth daily. 90 tablet 3  . Calcium Carbonate-Vitamin D (CALCIUM 600+D) 600-400 MG-UNIT per tablet Take 1 tablet by mouth daily.     . cetirizine (ZYRTEC) 5 MG tablet Take 5  mg by mouth as needed.      . cholecalciferol (VITAMIN D) 1000 UNITS tablet Take 1,000 Units by mouth daily.    [name] dextromethorphan-guaiFENesin (MUCINEX DM) 30-600 MG per 12 hr tablet Take 1 tablet by mouth as needed.     . dicyclomine (BENTYL) 20 MG tablet Take 1 tablet (20 mg total) by mouth 2 (two) times daily as needed (abdominal cramping). 60 tablet 5  . dipyridamole-aspirin (AGGRENOX) 200-25 MG 12hr capsule Take 1 capsule by mouth 2 (two) times daily. 180 capsule 3  . furosemide (LASIX) 20 MG tablet Take 1/2 daily as needed for swelling 60 tablet 5  . Lactobacillus (PROBIOTIC ACIDOPHILUS PO) Take by mouth daily. Reported on 12/03/2015    . levothyroxine (SYNTHROID, LEVOTHROID) 100 MCG tablet Take 100 mcg by mouth daily.      . metoprolol succinate (TOPROL-XL) 50 MG 24 hr tablet TAKE 1 TABLET BY MOUTH EVERY DAY TAKE WITH OR IMMEDIATELY FOLLOWING A MEAL 90 tablet 3  . Multiple Vitamins-Minerals (CENTRUM SILVER PO) Take 1 tablet by mouth daily.      . simvastatin (ZOCOR) 20 MG tablet Take 1 tablet (20 mg total) by mouth at bedtime. 90 tablet 3  . traMADol (ULTRAM) 50 MG tablet Take 1 tablet (50 mg total) by mouth 3 (three) times daily as needed. 90 tablet 5  . vitamin E 400 UNIT capsule Take 400 Units by mouth daily.    . [DISCONTINUED] amLODipine (NORVASC) 10 MG tablet Take 1 tablet (10 mg total) by mouth daily. 90 tablet 3  . [DISCONTINUED] dipyridamole-aspirin (AGGRENOX) 200-25 MG 12hr capsule Take 1  capsule by mouth 2 (two) times daily. 180 capsule 3  . [DISCONTINUED] furosemide (LASIX) 20 MG tablet Take 1/2 daily as needed for swelling 60 tablet 5  . [DISCONTINUED] metoprolol succinate (TOPROL-XL) 50 MG 24 hr tablet TAKE 1 TABLET BY MOUTH EVERY DAY TAKE WITH OR IMMEDIATELY FOLLOWING A MEAL 30 tablet 5  . [DISCONTINUED] simvastatin (ZOCOR) 20 MG tablet Take 1 tablet (20 mg total) by mouth at bedtime. 90 tablet 3  . [DISCONTINUED] amoxicillin-clavulanate (AUGMENTIN) 875-125 MG tablet Take 1 tablet by mouth 2 (two) times daily. (Patient not taking: Reported on 12/03/2016) 14 tablet 0  . [DISCONTINUED] cefdinir (OMNICEF) 300 MG capsule Take 1 capsule (300 mg total) by mouth 2 (two) times daily. (Patient not taking: Reported on 12/03/2016) 14 capsule 0  . [DISCONTINUED] Diphenhyd-Hydrocort-Nystatin (FIRST-DUKES MOUTHWASH) SUSP 1 tsp gargle and swallow 4 times daily as needed (Patient not taking: Reported on 12/03/2016) 120 mL 0   Facility-Administered Encounter Medications as of 12/03/2016  Medication Dose Route Frequency Provider Last Rate Last Dose  . 0.9 %  sodium chloride infusion  500 mL Intravenous Continuous Irene Shipper, MD        Allergies  Allergen Reactions  . Sulfonamide Derivatives     REACTION: rash    Immunization History  Administered Date(s) Administered  . H1N1 10/09/2008  . Influenza Split 07/20/2011, 07/14/2012  . Influenza Whole 07/25/2008, 08/09/2009, 07/25/2010  . Influenza, High Dose Seasonal PF 08/21/2016  . Influenza,inj,Quad PF,36+ Mos 07/26/2013, 07/30/2014, 07/30/2015  . Pneumococcal Conjugate-13 05/24/2014  . Pneumococcal Polysaccharide-23 11/02/2006  . Td 02/27/2009  . Zoster 11/02/2010    Current Medications, Allergies, Past Medical History, Past Surgical History, Family History, and Social History were reviewed in [employer] record.    Review of Systems        See HPI - all other systems neg except as noted... The  patient denies  anorexia, fever, weight loss, weight gain, vision loss, decreased hearing, hoarseness, chest pain, syncope, dyspnea on exertion, peripheral edema, prolonged cough, headaches, hemoptysis, abdominal pain, melena, hematochezia, severe indigestion/heartburn, hematuria, incontinence, muscle weakness, suspicious skin lesions, transient blindness, difficulty walking, depression, unusual weight change, abnormal bleeding, enlarged lymph nodes, and angioedema.     Objective:   Physical Exam     WD, WN, 76 y/o WF in NAD... GENERAL:  Alert & oriented; pleasant & cooperative... HEENT:  [place]/AT, EOM-wnl, PERRLA, pterygium left eye, EACs-clear, TMs-wnl, NOSE-clear, THROAT-clear & wnl. NECK:  Supple w/ fairROM; no JVD; normal carotid impulses w/o bruits; scar of prev thyroid surg, no nodules, no adenopathy. CHEST:  Clear to P & A; without wheezes/ rales/ or rhonchi. HEART:  Regular Rhythm; without murmurs/ rubs/ or gallops. ABDOMEN:  Soft & nontender; normal bowel sounds; no organomegaly or masses detected. EXT: without deformities or arthritic changes; no varicose veins/ venous insuffic/ or edema. NEURO:  CN's intact; motor testing normal; sensory testing normal; gait normal & balance OK. DERM:  s/p surg behind left pinna  RADIOLOGY DATA:  Reviewed in the EPIC EMR & discussed w/ the patient...  LABORATORY DATA:  Reviewed in the EPIC EMR & discussed w/ the patient...   Assessment & Plan:    HBP>  Controlled on meds, tol well, asymptomatic, continue same...  Cerebrovasc Dis & hx TIA>  She remains asymptomatic w/o cerebral ischemic symptoms, continue Agrennox...  HYPERLIPID>  Stable on diet + Simva20...  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  GI> Hx polyps>  She is up to date and had f/u colonoscopy 12/12 as above... 1/17>  Try Bentyl for IBS like symptoms...  Osteopenia>  Managed by GYN DrRichardson & pt reports that her last BMD  was "excellent"...  Skin Cancer>  She reports Moh's surg for BCE on nose, plus several "keratoses"; new lesion inside left pinna w/ surg pending...  Other medical issues as noted...   Patient's Medications  New Prescriptions   No medications on file  Previous Medications   CALCIUM CARBONATE-VITAMIN D (CALCIUM 600+D) 600-400 MG-UNIT PER TABLET    Take 1 tablet by mouth daily.    CETIRIZINE (ZYRTEC) 5 MG TABLET    Take 5 mg by mouth as needed.     CHOLECALCIFEROL (VITAMIN D) 1000 UNITS TABLET    Take 1,000 Units by mouth daily.   DEXTROMETHORPHAN-GUAIFENESIN (MUCINEX DM) 30-600 MG PER 12 HR TABLET    Take 1 tablet by mouth as needed.    DICYCLOMINE (BENTYL) 20 MG TABLET    Take 1 tablet (20 mg total) by mouth 2 (two) times daily as needed (abdominal cramping).   LACTOBACILLUS (PROBIOTIC ACIDOPHILUS PO)    Take by mouth daily. Reported on 12/03/2015   LEVOTHYROXINE (SYNTHROID, LEVOTHROID) 100 MCG TABLET    Take 100 mcg by mouth daily.     MULTIPLE VITAMINS-MINERALS (CENTRUM SILVER PO)    Take 1 tablet by mouth daily.     TRAMADOL (ULTRAM) 50 MG TABLET    Take 1 tablet (50 mg total) by mouth 3 (three) times daily as needed.   VITAMIN E 400 UNIT CAPSULE    Take 400 Units by mouth daily.  Modified Medications   Modified Medication Previous Medication   AMLODIPINE (NORVASC) 10 MG TABLET amLODipine (NORVASC) 10 MG tablet      Take 1 tablet (10 mg total) by mouth daily.    Take  1 tablet (10 mg total) by mouth daily.   DIPYRIDAMOLE-ASPIRIN (AGGRENOX) 200-25 MG 12HR CAPSULE dipyridamole-aspirin (AGGRENOX) 200-25 MG 12hr capsule      Take 1 capsule by mouth 2 (two) times daily.    Take 1 capsule by mouth 2 (two) times daily.   FUROSEMIDE (LASIX) 20 MG TABLET furosemide (LASIX) 20 MG tablet      Take 1/2 daily as needed for swelling    Take 1/2 daily as needed for swelling   METOPROLOL SUCCINATE (TOPROL-XL) 50 MG 24 HR TABLET metoprolol succinate (TOPROL-XL) 50 MG 24 hr tablet      TAKE 1 TABLET BY  MOUTH EVERY DAY TAKE WITH OR IMMEDIATELY FOLLOWING A MEAL    TAKE 1 TABLET BY MOUTH EVERY DAY TAKE WITH OR IMMEDIATELY FOLLOWING A MEAL   SIMVASTATIN (ZOCOR) 20 MG TABLET simvastatin (ZOCOR) 20 MG tablet      Take 1 tablet (20 mg total) by mouth at bedtime.    Take 1 tablet (20 mg total) by mouth at bedtime.  Discontinued Medications   AMOXICILLIN-CLAVULANATE (AUGMENTIN) 875-125 MG TABLET    Take 1 tablet by mouth 2 (two) times daily.   CEFDINIR (OMNICEF) 300 MG CAPSULE    Take 1 capsule (300 mg total) by mouth 2 (two) times daily.   DIPHENHYD-HYDROCORT-NYSTATIN (FIRST-DUKES MOUTHWASH) SUSP    1 tsp gargle and swallow 4 times daily as needed

## 2016-12-09 ENCOUNTER — Telehealth: Payer: Self-pay | Admitting: Pulmonary Disease

## 2016-12-09 NOTE — Telephone Encounter (Signed)
Called and spoke with pt and she is aware of results per SN. Nothing further is needed.  

## 2016-12-29 ENCOUNTER — Other Ambulatory Visit: Payer: Self-pay | Admitting: Pulmonary Disease

## 2016-12-30 DIAGNOSIS — Z85828 Personal history of other malignant neoplasm of skin: Secondary | ICD-10-CM | POA: Diagnosis not present

## 2016-12-30 DIAGNOSIS — L821 Other seborrheic keratosis: Secondary | ICD-10-CM | POA: Diagnosis not present

## 2016-12-30 DIAGNOSIS — D1801 Hemangioma of skin and subcutaneous tissue: Secondary | ICD-10-CM | POA: Diagnosis not present

## 2016-12-30 DIAGNOSIS — L814 Other melanin hyperpigmentation: Secondary | ICD-10-CM | POA: Diagnosis not present

## 2016-12-30 DIAGNOSIS — D225 Melanocytic nevi of trunk: Secondary | ICD-10-CM | POA: Diagnosis not present

## 2016-12-31 ENCOUNTER — Encounter: Payer: Self-pay | Admitting: Pulmonary Disease

## 2017-01-12 ENCOUNTER — Encounter: Payer: Self-pay | Admitting: Physician Assistant

## 2017-01-25 ENCOUNTER — Ambulatory Visit (INDEPENDENT_AMBULATORY_CARE_PROVIDER_SITE_OTHER): Payer: Medicare Other | Admitting: Physician Assistant

## 2017-01-25 ENCOUNTER — Encounter: Payer: Self-pay | Admitting: Physician Assistant

## 2017-01-25 VITALS — BP 120/70 | HR 70 | Resp 16 | Ht 61.0 in | Wt 131.0 lb

## 2017-01-25 DIAGNOSIS — Z86010 Personal history of colonic polyps: Secondary | ICD-10-CM

## 2017-01-25 DIAGNOSIS — Z7901 Long term (current) use of anticoagulants: Secondary | ICD-10-CM | POA: Diagnosis not present

## 2017-01-25 DIAGNOSIS — Z7189 Other specified counseling: Secondary | ICD-10-CM | POA: Diagnosis not present

## 2017-01-25 MED ORDER — PEG-KCL-NACL-NASULF-NA ASC-C 100 G PO SOLR: 1.0000 | Freq: Once | ORAL | 0 refills | Status: AC

## 2017-01-25 NOTE — Progress Notes (Signed)
Subjective:    Patient ID: [name], female    DOB: [birth year], 76 y.o.   MRN: [id number]  HPI  [name]  is a very nice 76 year old white female known to Dr. Henrene Pastor with history of adenomatous colon polyps. She comes in today to discuss follow-up colonoscopy in the setting of chronic antiplatelet therapy. Patient with history of hypertension, cerebrovascular disease with history of TIA and prior history of thyroid cancer. Her last colonoscopy was done in December 2012, 2 polyps were removed the largest 5 mm and both were tubular adenomas. Patient has been maintained on Aggrenox for several years and has had no recent cerebrovascular events. She says she's been doing well but has had a rough winter between her husband and herself. Her husband was hospitalized to couple of weeks ago with pneumonia. She has no complaints of changes in her bowel habits, no melena or hematochezia. She does have a prescription for dicyclomine which she uses on occasion.  Review of Systems Pertinent positive and negative review of systems were noted in the above HPI section.  All other review of systems was otherwise negative.  Outpatient Encounter Prescriptions as of 01/25/2017  Medication Sig  . amLODipine (NORVASC) 10 MG tablet Take 1 tablet (10 mg total) by mouth daily.  . Calcium Carbonate-Vitamin D (CALCIUM 600+D) 600-400 MG-UNIT per tablet Take 1 tablet by mouth daily.   . cetirizine (ZYRTEC) 5 MG tablet Take 5 mg by mouth as needed.    . cholecalciferol (VITAMIN D) 1000 UNITS tablet Take 1,000 Units by mouth daily.  [name] dextromethorphan-guaiFENesin (MUCINEX DM) 30-600 MG per 12 hr tablet Take 1 tablet by mouth as needed.   . dicyclomine (BENTYL) 20 MG tablet Take 1 tablet (20 mg total) by mouth 2 (two) times daily as needed (abdominal cramping).  [name] dipyridamole-aspirin (AGGRENOX) 200-25 MG 12hr capsule Take 1 capsule by mouth 2 (two) times daily.  . furosemide (LASIX) 20 MG tablet Take 1/2 daily as needed  for swelling  . Lactobacillus (PROBIOTIC ACIDOPHILUS PO) Take by mouth daily. Reported on 12/03/2015  . levothyroxine (SYNTHROID, LEVOTHROID) 100 MCG tablet Take 100 mcg by mouth daily.    . metoprolol succinate (TOPROL-XL) 50 MG 24 hr tablet TAKE 1 TABLET BY MOUTH EVERY DAY TAKE WITH OR IMMEDIATELY FOLLOWING A MEAL  . Multiple Vitamins-Minerals (CENTRUM SILVER PO) Take 1 tablet by mouth daily.    . simvastatin (ZOCOR) 20 MG tablet Take 1 tablet (20 mg total) by mouth at bedtime.  . traMADol (ULTRAM) 50 MG tablet Take 1 tablet (50 mg total) by mouth 3 (three) times daily as needed.  . vitamin E 400 UNIT capsule Take 400 Units by mouth daily.  . peg 3350 powder (MOVIPREP) 100 g SOLR Take 1 kit (200 g total) by mouth once.   Facility-Administered Encounter Medications as of 01/25/2017  Medication  . 0.9 %  sodium chloride infusion   Allergies  Allergen Reactions  . Sulfonamide Derivatives     REACTION: rash   Patient Active Problem List   Diagnosis Date Noted  . Right shoulder pain 05/24/2014  . Shingles 03/31/2012  . Skin cancer 10/02/2011  . MALIGNANT NEOPLASM OF THYROID GLAND 08/29/2009  . 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 ALLERGIC RHINITIS 02/27/2009  . COLONIC POLYPS 09/16/2008  . Cerebrovascular disease 02/29/2008  . BACK PAIN, LUMBAR 02/29/2008  . Hyperlipidemia 01/27/2008  . Essential hypertension 01/27/2008  . Transient cerebral ischemia 01/27/2008  . Disorder of bone and cartilage 01/27/2008   Social History  Social History  . Marital status: Married    Spouse name: [name]  . Number of children: 0  . Years of education: N/A   Occupational History  . Retired     data entry from [employer]  . Smoking status: Never Smoker  . Smokeless tobacco: Never Used  . Alcohol use No  . Drug use: No  . Sexual activity: Not on file   Other Topics Concern  . Not on file   Social History Narrative   One caffeine drink daily      [name] family history includes Bone cancer in her brother; Colon polyps in her sister; Heart disease in her mother; Hypertension in her father; Lung cancer in her brother; Prostate cancer in her brother.      Objective:    Vitals:   01/25/17 1315  BP: 120/70  Pulse: 70  Resp: 16    Physical Exam  well-developed older white female in no acute distress, very pleasant blood pressure 120/70, pulse 70, BMI 24.7. HEENT;nontraumatic normocephalic EOMI PERRLA sclera anicteric, Cardiovascular ;regular rate and rhythm with S1-S2 no murmur or gallop, Pulmonary ;clear bilaterally, Abdomen; soft nontender nondistended bowel sounds are active there is no palpable mass or hepatosplenomegaly, Rectal; exam not done, Neuropsych; no deficits, mood and affect appropriate       Assessment & Plan:   #38 76 year old white female with history of adenomatous colon polyps, due for follow-up colonoscopy. #2 chronic antiplatelet therapy-on Aggrenox #3 cerebrovascular disease with history of TIA #4 hypertension #5 history of thyroid CA  Plan; Patient will be scheduled for colonoscopy with Dr. Henrene Pastor. Procedure discussed in detail with the patient including risks and benefits and she is agreeable to proceed. Patient had her last colonoscopy done while on Aggrenox and we will leave her on Aggrenox for this colonoscopy as well. Rationale for this approach discussed with patient. She understands there is a small risk of bleeding post polypectomy.  Katherine Porche Genia Harold PA-C 01/25/2017   Cc: Noralee Space, MD

## 2017-01-25 NOTE — Progress Notes (Signed)
Yes. May stay on Aggrenox for her colonoscopy

## 2017-01-25 NOTE — Patient Instructions (Signed)
You have been scheduled for a colonoscopy. Please follow written instructions given to you at your visit today.  Please pick up your prep supplies at the pharmacy within the next 1-3 days. If you use inhalers (even only as needed), please bring them with you on the day of your procedure. Your physician has requested that you go to www.startemmi.com and enter the access code given to you at your visit today. This web site gives a general overview about your procedure. However, you should still follow specific instructions given to you by our office regarding your preparation for the procedure.  STAY ON AGGRENOX FOR THE COLONOSCOPY.

## 2017-02-10 DIAGNOSIS — Z1231 Encounter for screening mammogram for malignant neoplasm of breast: Secondary | ICD-10-CM | POA: Diagnosis not present

## 2017-02-10 DIAGNOSIS — Z853 Personal history of malignant neoplasm of breast: Secondary | ICD-10-CM | POA: Diagnosis not present

## 2017-03-01 DIAGNOSIS — Z961 Presence of intraocular lens: Secondary | ICD-10-CM | POA: Diagnosis not present

## 2017-03-25 ENCOUNTER — Telehealth: Payer: Self-pay | Admitting: Pulmonary Disease

## 2017-03-25 MED ORDER — AMOXICILLIN-POT CLAVULANATE 875-125 MG PO TABS: 1.0000 | ORAL_TABLET | Freq: Two times a day (BID) | ORAL | 0 refills | Status: DC

## 2017-03-25 NOTE — Telephone Encounter (Signed)
Pt aware of rec's per Dr Lenna Gilford.  Rx sent to [facility].  Nothing further needed.

## 2017-03-25 NOTE — Telephone Encounter (Signed)
Pt c/o sinus congestion, pnd, prod cough with green mucus tinged with blood X3 days.  Denies fever, sore throat, chest pain/congestion, laryngitis.   Pt has taken mucinex, zyrtec to help with symptoms.  Requesting further recs.    Pt uses [facility] on [address].    Pt has a colonoscopy on 6/11 and wants to be sure this is resolved by then.    SN please advise on further recs.  Thanks.

## 2017-03-25 NOTE — Telephone Encounter (Signed)
Per SN:  Augmentin 875mg , #14, take 1 po BID Take OTC Align 1 daily.

## 2017-04-12 ENCOUNTER — Ambulatory Visit (AMBULATORY_SURGERY_CENTER): Payer: Medicare Other | Admitting: Internal Medicine

## 2017-04-12 ENCOUNTER — Encounter: Payer: Self-pay | Admitting: Internal Medicine

## 2017-04-12 VITALS — BP 117/65 | HR 75 | Temp 97.8°F | Resp 12 | Ht 61.0 in | Wt 131.0 lb

## 2017-04-12 DIAGNOSIS — Z86010 Personal history of colonic polyps: Secondary | ICD-10-CM | POA: Diagnosis not present

## 2017-04-12 DIAGNOSIS — D122 Benign neoplasm of ascending colon: Secondary | ICD-10-CM | POA: Diagnosis not present

## 2017-04-12 DIAGNOSIS — I10 Essential (primary) hypertension: Secondary | ICD-10-CM | POA: Diagnosis not present

## 2017-04-12 DIAGNOSIS — E785 Hyperlipidemia, unspecified: Secondary | ICD-10-CM | POA: Diagnosis not present

## 2017-04-12 MED ORDER — SODIUM CHLORIDE 0.9 % IV SOLN: 500.0000 mL | INTRAVENOUS | Status: DC

## 2017-04-12 NOTE — Progress Notes (Signed)
Pt's states no medical or surgical changes since previsit or office visit. 

## 2017-04-12 NOTE — Progress Notes (Signed)
Report to PACU, RN, vss, BBS= Clear.  

## 2017-04-12 NOTE — Patient Instructions (Signed)
YOU HAD AN ENDOSCOPIC PROCEDURE TODAY AT THE  ENDOSCOPY CENTER:   Refer to the procedure report that was given to you for any specific questions about what was found during the examination.  If the procedure report does not answer your questions, please call your gastroenterologist to clarify.  If you requested that your care partner not be given the details of your procedure findings, then the procedure report has been included in a sealed envelope for you to review at your convenience later.  YOU SHOULD EXPECT: Some feelings of bloating in the abdomen. Passage of more gas than usual.  Walking can help get rid of the air that was put into your GI tract during the procedure and reduce the bloating. If you had a lower endoscopy (such as a colonoscopy or flexible sigmoidoscopy) you may notice spotting of blood in your stool or on the toilet paper. If you underwent a bowel prep for your procedure, you may not have a normal bowel movement for a few days.  Please Note:  You might notice some irritation and congestion in your nose or some drainage.  This is from the oxygen used during your procedure.  There is no need for concern and it should clear up in a day or so.  SYMPTOMS TO REPORT IMMEDIATELY:   Following lower endoscopy (colonoscopy or flexible sigmoidoscopy):  Excessive amounts of blood in the stool  Significant tenderness or worsening of abdominal pains  Swelling of the abdomen that is new, acute  Fever of 100F or higher   For urgent or emergent issues, a gastroenterologist can be reached at any hour by calling [phone].   DIET:  We do recommend a small meal at first, but then you may proceed to your regular diet.  Drink plenty of fluids but you should avoid alcoholic beverages for 24 hours.  ACTIVITY:  You should plan to take it easy for the rest of today and you should NOT DRIVE or use heavy machinery until tomorrow (because of the sedation medicines used during the test).     FOLLOW UP: Our staff will call the number listed on your records the next business day following your procedure to check on you and address any questions or concerns that you may have regarding the information given to you following your procedure. If we do not reach you, we will leave a message.  However, if you are feeling well and you are not experiencing any problems, there is no need to return our call.  We will assume that you have returned to your regular daily activities without incident.  If any biopsies were taken you will be contacted by phone or by letter within the next 1-3 weeks.  Please call us at [phone] if you have not heard about the biopsies in 3 weeks.    SIGNATURES/CONFIDENTIALITY: You and/or your care partner have signed paperwork which will be entered into your electronic medical record.  These signatures attest to the fact that that the information above on your After Visit Summary has been reviewed and is understood.  Full responsibility of the confidentiality of this discharge information lies with you and/or your care-partner.    Handouts were given to your care partner on polyps and hemorrhoids. You may resume your current medications today. Await biopsy results. Please call if any questions or concerns.   

## 2017-04-12 NOTE — Progress Notes (Signed)
No problems noted in the recovery room. maw 

## 2017-04-12 NOTE — Progress Notes (Signed)
Called to room to assist during endoscopic procedure.  Patient ID and intended procedure confirmed with present staff. Received instructions for my participation in the procedure from the performing physician.  

## 2017-04-12 NOTE — Op Note (Signed)
[facility] Patient Name: [name] Procedure Date: 04/12/2017 9:11 AM MRN: [id number] Endoscopist: Docia Chuck. Henrene Pastor , MD Age: 76 Referring MD:  Date of Birth: 07-27-1941 Gender: Female Account #: [id number] Procedure:                Colonoscopy, with cold snare polypectomy x 1 Indications:              High risk colon cancer surveillance: Personal                            history of multiple (3 or more) adenomas. Previous                            examinations 2009 and 2012 Medicines:                Monitored Anesthesia Care Procedure:                Pre-Anesthesia Assessment:                           - Prior to the procedure, a History and Physical                            was performed, and patient medications and                            allergies were reviewed. The patient's tolerance of                            previous anesthesia was also reviewed. The risks                            and benefits of the procedure and the sedation                            options and risks were discussed with the patient.                            All questions were answered, and informed consent                            was obtained. Prior Anticoagulants: The patient has                            taken Aggrenox throughout. ASA Grade Assessment: II                            - A patient with mild systemic disease. After                            reviewing the risks and benefits, the patient was                            deemed in satisfactory condition to undergo the  procedure.                           After obtaining informed consent, the colonoscope                            was passed under direct vision. Throughout the                            procedure, the patient's blood pressure, pulse, and                            oxygen saturations were monitored continuously. The                            Colonoscope was introduced  through the anus and                            advanced to the the cecum, identified by                            appendiceal orifice and ileocecal valve. The                            ileocecal valve, appendiceal orifice, and rectum                            were photographed. The quality of the bowel                            preparation was excellent. The colonoscopy was                            performed without difficulty. The patient tolerated                            the procedure well. The bowel preparation used was                            SUPREP. Scope In: 9:18:17 AM Scope Out: 9:29:17 AM Scope Withdrawal Time: 0 hours 9 minutes 4 seconds  Total Procedure Duration: 0 hours 11 minutes 0 seconds  Findings:                 A 1 mm polyp was found in the ascending colon. The                            polyp was removed with a cold snare. Resection and                            retrieval were complete.                           Internal hemorrhoids were found during retroflexion.  The exam was otherwise without abnormality on                            direct and retroflexion views. Complications:            No immediate complications. Estimated blood loss:                            None. Estimated Blood Loss:     Estimated blood loss: none. Impression:               - One 1 mm polyp in the ascending colon, removed                            with a cold snare. Resected and retrieved.                           - Internal hemorrhoids.                           - The examination was otherwise normal on direct                            and retroflexion views. Recommendation:           - Repeat colonoscopy is not recommended for                            surveillance.                           - Patient has a contact number available for                            emergencies. The signs and symptoms of potential                            delayed  complications were discussed with the                            patient. Return to normal activities tomorrow.                            Written discharge instructions were provided to the                            patient.                           - Resume previous diet.                           - Continue present medications.                           - Await pathology results. Docia Chuck. Henrene Pastor, MD 04/12/2017 9:33:21 AM This report has been signed electronically.

## 2017-04-13 ENCOUNTER — Telehealth: Payer: Self-pay

## 2017-04-13 NOTE — Telephone Encounter (Signed)
  Follow up Call-  Call back number 04/12/2017  Post procedure Call Back phone  # [phone]  Permission to leave phone message Yes  Some recent data might be hidden     Patient questions:  Do you have a fever, pain , or abdominal swelling? No. Pain Score  0 *  Have you tolerated food without any problems? Yes.    Have you been able to return to your normal activities? Yes.    Do you have any questions about your discharge instructions: Diet   No. Medications  No. Follow up visit  No.  Do you have questions or concerns about your Care? No.  Actions: * If pain score is 4 or above: No action needed, pain <4.

## 2017-04-19 DIAGNOSIS — E890 Postprocedural hypothyroidism: Secondary | ICD-10-CM | POA: Diagnosis not present

## 2017-04-19 DIAGNOSIS — Z85850 Personal history of malignant neoplasm of thyroid: Secondary | ICD-10-CM | POA: Diagnosis not present

## 2017-04-21 ENCOUNTER — Encounter: Payer: Self-pay | Admitting: Internal Medicine

## 2017-04-21 DIAGNOSIS — Z833 Family history of diabetes mellitus: Secondary | ICD-10-CM | POA: Diagnosis not present

## 2017-04-21 DIAGNOSIS — E890 Postprocedural hypothyroidism: Secondary | ICD-10-CM | POA: Diagnosis not present

## 2017-04-21 DIAGNOSIS — Z85850 Personal history of malignant neoplasm of thyroid: Secondary | ICD-10-CM | POA: Diagnosis not present

## 2017-04-21 DIAGNOSIS — R7309 Other abnormal glucose: Secondary | ICD-10-CM | POA: Diagnosis not present

## 2017-04-21 DIAGNOSIS — M8580 Other specified disorders of bone density and structure, unspecified site: Secondary | ICD-10-CM | POA: Diagnosis not present

## 2017-04-22 DIAGNOSIS — M8588 Other specified disorders of bone density and structure, other site: Secondary | ICD-10-CM | POA: Diagnosis not present

## 2017-04-22 DIAGNOSIS — Z833 Family history of diabetes mellitus: Secondary | ICD-10-CM | POA: Diagnosis not present

## 2017-04-22 DIAGNOSIS — R7301 Impaired fasting glucose: Secondary | ICD-10-CM | POA: Diagnosis not present

## 2017-06-02 ENCOUNTER — Encounter: Payer: Self-pay | Admitting: Pulmonary Disease

## 2017-06-02 ENCOUNTER — Ambulatory Visit (INDEPENDENT_AMBULATORY_CARE_PROVIDER_SITE_OTHER): Payer: Medicare Other | Admitting: Pulmonary Disease

## 2017-06-02 VITALS — BP 110/70 | HR 68 | Temp 98.0°F | Ht 61.0 in | Wt 125.4 lb

## 2017-06-02 DIAGNOSIS — C73 Malignant neoplasm of thyroid gland: Secondary | ICD-10-CM

## 2017-06-02 DIAGNOSIS — M949 Disorder of cartilage, unspecified: Secondary | ICD-10-CM | POA: Diagnosis not present

## 2017-06-02 DIAGNOSIS — E032 Hypothyroidism due to medicaments and other exogenous substances: Secondary | ICD-10-CM

## 2017-06-02 DIAGNOSIS — D126 Benign neoplasm of colon, unspecified: Secondary | ICD-10-CM

## 2017-06-02 DIAGNOSIS — I679 Cerebrovascular disease, unspecified: Secondary | ICD-10-CM | POA: Diagnosis not present

## 2017-06-02 DIAGNOSIS — G459 Transient cerebral ischemic attack, unspecified: Secondary | ICD-10-CM | POA: Diagnosis not present

## 2017-06-02 DIAGNOSIS — M899 Disorder of bone, unspecified: Secondary | ICD-10-CM | POA: Diagnosis not present

## 2017-06-02 DIAGNOSIS — I10 Essential (primary) hypertension: Secondary | ICD-10-CM | POA: Diagnosis not present

## 2017-06-02 DIAGNOSIS — E782 Mixed hyperlipidemia: Secondary | ICD-10-CM

## 2017-06-02 MED ORDER — SIMVASTATIN 20 MG PO TABS: 20.0000 mg | ORAL_TABLET | Freq: Every day | ORAL | 3 refills | Status: DC

## 2017-06-02 MED ORDER — ASPIRIN-DIPYRIDAMOLE ER 25-200 MG PO CP12: 1.0000 | ORAL_CAPSULE | Freq: Two times a day (BID) | ORAL | 3 refills | Status: DC

## 2017-06-02 MED ORDER — METOPROLOL SUCCINATE ER 50 MG PO TB24: ORAL_TABLET | ORAL | 3 refills | Status: DC

## 2017-06-02 NOTE — Patient Instructions (Signed)
Today we updated your med list in our EPIC system...    Continue your current medications the same...  Keep up the good work w/ diet/ exercise/ etc...  Call for any questions...  Let's plan a follow up visit in 29mo w/ FASTING blood work at that time.Marland KitchenMarland Kitchen

## 2017-07-15 ENCOUNTER — Telehealth: Payer: Self-pay | Admitting: Pulmonary Disease

## 2017-07-15 MED ORDER — AZITHROMYCIN 250 MG PO TABS: ORAL_TABLET | ORAL | 0 refills | Status: DC

## 2017-07-15 MED ORDER — PREDNISONE 5 MG (21) PO TBPK: ORAL_TABLET | ORAL | 0 refills | Status: DC

## 2017-07-15 NOTE — Telephone Encounter (Signed)
Pt c/o sinus congestion, pnd, prod cough with yellow mucus, chest tightness Xseveral days.  Denies fever, sharp chest pains, chills.    Mucinex, otc cough syrup, zyrtec, tylenol, saline nasal rinses.  Requesting further recs.    SN please advise.  Thanks.

## 2017-07-15 NOTE — Telephone Encounter (Signed)
Per SN---   zpak and pred taper 5 mg  6 day pack.  This has been sent to the pharmacy and the pt is aware. Nothing further is needed.

## 2017-07-21 DIAGNOSIS — E890 Postprocedural hypothyroidism: Secondary | ICD-10-CM | POA: Diagnosis not present

## 2017-07-23 DIAGNOSIS — E890 Postprocedural hypothyroidism: Secondary | ICD-10-CM | POA: Diagnosis not present

## 2017-07-23 DIAGNOSIS — Z833 Family history of diabetes mellitus: Secondary | ICD-10-CM | POA: Diagnosis not present

## 2017-07-23 DIAGNOSIS — Z85850 Personal history of malignant neoplasm of thyroid: Secondary | ICD-10-CM | POA: Diagnosis not present

## 2017-07-23 DIAGNOSIS — R7303 Prediabetes: Secondary | ICD-10-CM | POA: Diagnosis not present

## 2017-07-23 DIAGNOSIS — M8580 Other specified disorders of bone density and structure, unspecified site: Secondary | ICD-10-CM | POA: Diagnosis not present

## 2017-08-17 DIAGNOSIS — L817 Pigmented purpuric dermatosis: Secondary | ICD-10-CM | POA: Diagnosis not present

## 2017-08-20 ENCOUNTER — Ambulatory Visit (INDEPENDENT_AMBULATORY_CARE_PROVIDER_SITE_OTHER): Payer: Medicare Other

## 2017-08-20 DIAGNOSIS — Z23 Encounter for immunization: Secondary | ICD-10-CM | POA: Diagnosis not present

## 2017-11-29 ENCOUNTER — Telehealth: Payer: Self-pay | Admitting: Pulmonary Disease

## 2017-11-29 NOTE — Telephone Encounter (Signed)
Called and spoke to pt. Pt states on 1.26.2019 pt had a near syncopal episode, pt was sitting and "blacked out", pt did not lose consciousness. Pt states the black out only last a few seconds. Pt states she is not having any other issues. Pt states her pressure has consistently been running over 323 systolic and HR between 70 and 92. Pt states she has had episodes of dizziness in past but has not recently. Pt also states she has been under a lot of stress since her brother recently passed away. Pt has an upcoming appt with SN on 12/06/2017, pt did not want to change the appt because her and her husband are being seen by SN on the same day. Pt states she will change the appt if SN finds it necessary.   Dr. Lenna Gilford please advise if pt needs to be seen sooner or if anything needs to be done prior to pt's appt on 12/06/2017. Thanks.   Allergies  Allergen Reactions  . Sulfonamide Derivatives     REACTION: rash    Current Outpatient Medications on File Prior to Visit  Medication Sig Dispense Refill  . amLODipine (NORVASC) 10 MG tablet Take 1 tablet (10 mg total) by mouth daily. 90 tablet 3  . Calcium Carbonate-Vitamin D (CALCIUM 600+D) 600-400 MG-UNIT per tablet Take 1 tablet by mouth daily.     . cetirizine (ZYRTEC) 5 MG tablet Take 5 mg by mouth as needed.      . cholecalciferol (VITAMIN D) 1000 UNITS tablet Take 1,000 Units by mouth daily.    [name] dextromethorphan-guaiFENesin (MUCINEX DM) 30-600 MG per 12 hr tablet Take 1 tablet by mouth as needed.     . dicyclomine (BENTYL) 20 MG tablet Take 1 tablet (20 mg total) by mouth 2 (two) times daily as needed (abdominal cramping). 60 tablet 5  . dipyridamole-aspirin (AGGRENOX) 200-25 MG 12hr capsule Take 1 capsule by mouth 2 (two) times daily. 180 capsule 3  . furosemide (LASIX) 20 MG tablet Take 1/2 daily as needed for swelling 60 tablet 5  . Lactobacillus (PROBIOTIC ACIDOPHILUS PO) Take by mouth daily. Reported on 12/03/2015    . levothyroxine (SYNTHROID,  LEVOTHROID) 100 MCG tablet Take 100 mcg by mouth daily.      . metoprolol succinate (TOPROL-XL) 50 MG 24 hr tablet TAKE 1 TABLET BY MOUTH EVERY DAY TAKE WITH OR IMMEDIATELY FOLLOWING A MEAL 90 tablet 3  . Multiple Vitamins-Minerals (CENTRUM SILVER PO) Take 1 tablet by mouth daily.      . simvastatin (ZOCOR) 20 MG tablet Take 1 tablet (20 mg total) by mouth at bedtime. 90 tablet 3  . traMADol (ULTRAM) 50 MG tablet Take 1 tablet (50 mg total) by mouth 3 (three) times daily as needed. 90 tablet 5  . vitamin E 400 UNIT capsule Take 400 Units by mouth daily.     Current Facility-Administered Medications on File Prior to Visit  Medication Dose Route Frequency Provider Last Rate Last Dose  . 0.9 %  sodium chloride infusion  500 mL Intravenous Continuous Irene Shipper, MD      . 0.9 %  sodium chloride infusion  500 mL Intravenous Continuous Irene Shipper, MD

## 2017-11-29 NOTE — Telephone Encounter (Signed)
Per SN:  Ok to keep appt. Let us know if she has any further episodes. Pt called and informed.

## 2017-12-05 NOTE — Progress Notes (Signed)
Subjective:    Patient ID: [name], female    DOB: [birth year], 77 y.o.   MRN: [id number]  HPI 77 y/o WF here for a follow up visit... she has mult med problems as noted below> ~  SEE PREV EPIC NOTES FOR OLDER DATA >>     CXR 1/14 showed norm heart size, clear lungs-wnl, clips in lower neck from thyroid surg...  LABS 1/14: FLP- at goals on Simva20;  Chems- wnl;  CBC- wnl;  VitD= 69;  Thyroid per DrKerr...  LABS 1/15:  FLP- at goals on Simva20;  Chems- wnl;  CBC- wnl;  TSH=0.42 on Levo100...    ~  November 28, 2014:  51moROV & LGoldatells me she had left eye surg 10/15 by DrMSpencer (?pterigium)) w/ post op hemorrhage prob due to Aggrenox rx;  She also had an URI 12/15 & called in- given Augmentin & Pred=> symptoms resolved... We reviewed the following medical problems during today's office visit >>     AR> on Zyrtek, Mucinex; stable on prn med rx...    HBP, MVP> on Metop50, Amlod10, Lasix20-1/2daily prn; BP= 128/78 & she denies CP, palpit, dizzy, SOB, edema, etc...    Cerebrovasc dis & TIA> on Aggrenox25-200Bid; she remains asymptomatic w/o cerebral ischemic symptoms...    CHOL> on Simva20; FLP 1/16 shows TChol 173, TG 143, HDL 51, LDL 93; continue same Rx & diet efforts...    Thyroid cancer & hypothy> on Synthroid100; followed by DrKerr Q635moseen 6/15 & doing well...    GI- colon polyps> followed by DrPerry w/ last colon 12/12 showing 2 sm adenomatous polyps removed & f/u planned 5y6yr.    DJD right shoulder & LBP> off Vicodin & using Tramadol50 prn; she had eval DrDuda (given 2 shots in shoulder 2015 & improved) [facility] doing well w/o signif recurrent pain...    Osteopenia> on calcium, MVI, VitD; prev on Fosamax but stopped by GYN ?2008 & they follow her BMDs...    Skin Cancer> new- in left ext ear canal w/ surg planned by Derm 1/14...    Shingles> she had the shingles vaccine prev; developed rt leg rash/pain & eval by Ortho- DrHilts 12/13- treated w/  Valtrex, Pred, Vicodin & resolved... We reviewed prob list, meds, xrays and labs> see below for updates >> she had the 2015 flu vaccine 9/15 7 is up to date on all vaccinations...   CXR 1/16 showed norm heart size, calcif in Ao, clear lungs, NAD...   LABS 1/16:  FLP- at goals on Simva20;  Chems- wnl;  CBC- wnl;  TSH=0.40 on synth100;  VitD=51 on 1000u daily...   ~  May 29, 2015:  16mo75mo & [name] but has mult somatic complaints- notes another epidural steroid shot 5/16 by DrNewton after failing Pred (she states intol "it tore up nmy stomach"); under stress since her brother had a stroke;  She has some dysuria & wants a urinalysis thinking that the Pred gave her a UTI...    BP stable on Amlod10, MetopER50, Lasix20-1/2 prn; BP=138/72 & she denies HAs, CP, palpit, SOB, edema...     She remains on Aggrenox Bid w/o cerebral ischemic symptoms, stable...    Lipids have been at goal on diet + Simva20...    Thyroid stable on Synthroid100...    She takes calcium, Vits, probiotic, Tramadol prn... EXAM shows Afeb, VSS, O2sat=100% on RA;  HEENT- neg, mallampati2;  Chest- clear w/o w/r/r;  Heart- RR w/o m/r/g;  AQbd- soft, nontender;  Ext- neg w/o c/c/e;  Neuro- neg, intact... IMP/PLAN>>  [name] is stable on current meds- refilled per request; she already had the 2016 Flu vaccine & up to date; ROV 70mo..  ~  December 03, 2015:  650moOScotiaeports that she had a skin cancer removed from under her left eye at the skin cancer center DrAlbertini;  She has mult somatic complaints including left ear discomfort, & lower abd pain x several days- we discussed IBS & Rx trial w/ Bentyl vs referral to GI- DrPerry    AR> on Zyrtek, Mucinex; stable on prn med rx...    HBP, MVP> on Metop50, Amlod10, Lasix20-1/2daily prn; BP= 138/72 & she denies CP, palpit, dizzy, SOB, edema, etc...    Cerebrovasc dis & TIA> on Aggrenox25-200Bid; she remains asymptomatic w/o cerebral ischemic symptoms...    CHOL> on Simva20;  FLP 1/17 shows TChol 168, TG 128, HDL 58, LDL 84; continue same Rx & diet efforts...    Thyroid cancer & hypothy> s/p thyroid surg, on Synthroid100; followed by DrKerr Q6m77modoing well; Labs here 1/17 showed TSH= 0.37    GI- colon polyps> followed by DrPerry w/ last colon 12/12 showing 2 sm adenomatous polyps removed & f/u planned 11yr68yr/o IBS like symptoms 1/17- trial Bentyl20 tid prn...    DJD right shoulder & LBP> off Vicodin & using Tramadol50 prn; she had eval DrDuda (given 2 shots in shoulder 2015 & improved) [facility]; doing well w/o signif recurrent pain...    Osteopenia> on calcium, MVI, VitD; prev on Fosamax but stopped by GYN ?2008 & they follow her BMDs...    Skin Cancer> new- in left ext ear canal w/ surg by DermPayton Mccallum4...    Shingles> she had the shingles vaccine prev; developed rt leg rash/pain & eval by Ortho- DrHilts 12/13- treated w/ Valtrex, Pred, Vicodin & resolved... EXAM shows Afeb, VSS, O2sat=98% on RA;  HEENT- neg x cerumen, mallampati2;  Chest- clear w/o w/r/r;  Heart- RR w/o m/r/g;  AQbd- soft, nontender;  Ext- neg w/o c/c/e;  Neuro- neg, intact...  CXR 12/03/15> norm heart size, aortic calcif is noted, clear lungs/ NAD...  Marland KitchenMarland KitchenKG 12/03/15>  NSR, rate64, wnl/ NAD....  LABS 12/03/15>  FLP- at goals on simva20- continue same;  Chems- wnl on diet alone;  CBC- wnl;  TSH=0.37 on Synthroid100 per drKerr;  VitD level = 56...  IMP/PLAN>>  LouiKimberlstable- we'll try Bentyl for abd cramping & f/u w/ DrPerry if not responding;  Also needs ENT for cerumen impactions; due to Gyn check w/ DrRichardson...  ~  June 01, 2016:  17mo 108mo&Renner Cornerrts that the Bentyl prescribed last OV really helped her IBS cramping & she continues using it Bid prn;  She reports otherw doing satis- no new complaints or concerns...     She remains on Aggrenox Bid, MetopER50, Amlod10, Lasiz20 prn; BP=126/74 & she denies CP, palpit, SOB, edema, and no cerebral ischemic symptoms.    FLP looks  good on Simva20 & diet...    She remains on Synthroid100 per DrKerr & is clinically & biochem euthyroid...  EXAM shows Afeb, VSS, O2sat=98% on RA;  HEENT- neg x cerumen, mallampati2;  Chest- clear w/o w/r/r;  Heart- RR w/o m/r/g;  AQbd- soft, nontender;  Ext- neg w/o c/c/e;  Neuro- neg, intact... IMP/PLAN>>  LouisTameikatable, we reflled meds per request, continue same + diet/ exercise...   ~  December 03, 2016:  81moROV & Rily reports a stable interval- no new complaints or concerns;  She's had Bilat cataract [id number] DrShapiro & doing satis;  She saw GYN- DrRichardson 09/2016 & stable- Mammogram was neg w/ dense breasts and BMD (last 01/2016) showed lowest [facility]... We reviewed the following medical problems during today's office visit >>     AR> on Zyrtek, Mucinex; stable on prn med rx...    HBP, MVP> on Metop50, Amlod10, Lasix20-1/2daily prn; BP= 118/64 & she denies CP, palpit, dizzy, SOB, edema, etc...    Cerebrovasc dis & TIA> on Aggrenox25-200Bid; she remains asymptomatic w/o cerebral ischemic symptoms...    CHOL> on Simva20; FLP 1/18 shows TChol 165, TG 109, HDL 51, LDL 92; continue same Rx & diet efforts...    Thyroid cancer & hypothy> s/p thyroid surg, on Synthroid100; followed by DrKerr Q663mo doing well; Labs here 1/18 showed TSH= 0.48    GI- colon polyps> followed by DrPerry w/ last colon 12/12 showing 2 sm adenomatous polyps removed & f/u planned 5y84yrc/o IBS like symptoms 1/17- trial Bentyl20 tid prn; she will call GI for f/u colon......Marland KitchenMarland KitchenMarland Kitchen DJD right shoulder & LBP> off Vicodin & using Tramadol50 prn; she had eval DrDuda (given 2 shots in shoulder 2015 & improved) & [facility]; doing well w/o signif recurrent pain (wears back brace when working    Osteopenia> on calcium, MVI, VitD; prev on Fosamax but stopped by GYN ?2008 & they follow her BMDs=> last 3/17 showing lowest Tscore -2.2 in left FemNeck on Calcium, MVI, VitD,  wt bearing exercise (consider Prolia)......    Skin Cancer> new- in left ext ear canal w/ surg by DerPayton Mccallum14...    Shingles> she had the shingles vaccine prev; developed rt leg rash/pain & eval by Ortho- DrHilts 12/13- treated w/ Valtrex, Pred, Vicodin & resolved... EXAM shows Afeb, VSS, O2sat=99% on RA;  HEENT- neg x cerumen, mallampati2;  Chest- clear w/o w/r/r;  Heart- RR Gr1/6 SEM w/o r/g;  Abd- soft, nontender;  Ext- neg w/o c/c/e;  Neuro- neg, intact...  LABS 12/03/16:  FLP- all parameters at goals on Simva20;  Chems- wnl;  CBC- wnl;  TSH=0.48;  VitD=51;   IMP/PLAN>>  Mekia remains stable overall, rec to continue same meds (refilled today per request), she is due for a colonoscopy w/ DrPerry & we will refer; she knows to call for any problems otherw follow up in ~40mo58mo   ~  June 02, 2017:  40mo 67mo& LouisKlairabeen doing well but noticed a sharp pain in the center of her chest today, resolved spont, no pain prev or since & not assoc w/ paplit, dizziness, syncope, SOB, edema, etc...    She had Ophthal check from DrShapiro 03/01/17> s/p cat surg w/ improved vision, some dryness, doing satis post op & asked to ret 89yr..81yr She had Colonoscopy 04/12/17 by DrPerry>  Tiny 1mm po28m removed, int hems noted, otherw neg... Polyp was a tubular adenoma & rec for f/u colon in 18yrs...73yr She saw ENDO-DrKerr 04/21/17>  Post surg hypothyroid, hx papillary adenoCa of thyroid, hx low bone mass (managed by GYN), fam hx DM w/ abn BS levels;  Labs showed BS=100, on Bisphos drug holiday, TSH=0.40, thyroglob antibody NEG...  We reviewed the following medical problems during today's office visit >>     HBP, MVP, cerebrovasc dis & TIA>  on Metop50, Amlod10, Lasix20-1/2daily prn, + Aggrenox25-200Bid; BP=110/70 & she denies CP, palpit, dizzy, weakness, etc...    Chol ok on Simva20 & Sugars ok on diet alone- followed by DrKerr...    Hx thyroid ca & on Synthroid100 w/H=0.40 by DrKerr recently...    Low bone mass followed  & managed by GYN- on bisphos drug holiday at this time...  EXAM shows Afeb, VSS, O2sat=98% on RA;  HEENT- neg x cerumen, mallampati2;  Chest- clear w/o w/r/r;  Heart- RR Gr1/6 SEM w/o r/g;  Abd- soft, nontender;  Ext- neg w/o c/c/e;  Neuro- neg, intact... IMP/PLAN>>  Shatarra is stable- continue current rx 7 we plan ROV recheck in 76mow/ CPX- labs, CXR, EKG, 2DEcho...         Problem List:  ALLERGIC RHINITIS (ICD-477.9) - we discussed Rx w/ Zyrtek, Saline, Mucinex OTC...  HYPERTENSION (ICD-401.9) - on METOPROLOL XL 526md, NORVASC 101m, & LASIX 64m73m/2 tab daily...   ~  CXR 12/10 showed mild cardiomeg, clear lungs, NAD... ~  10/11:  BP=134/86, doing well w/ med + home BP checks... denies HA, fatigue, visual changes, CP, palipit, dizziness, syncope, dyspnea, edema, etc... ~  4/12:  BP= 120/76 & she continues to remain asymptomatic... ~  11/12:  BP= 122/72 & she continues stable w/o CP, palpit, dizzy, SOB, edema, or cerebral ischemic symptoms on her Aggrenox Bid. ~  5/13:  BP= 138/72 & she denies CP, palpit, SOB, edema, etc... ~  1/14: on Metop50, Amlod10, Lasix20-1/2daily; BP= 138/76 & she denies CP, palpit, dizzy, SOB, edema, etc... ~  CXR 1/14 showed normal heart size, clear lungs, surg clips in low neck, NAD... ~[name] 7/14: BP is controlled on MetopER50-1/2 tab, Amlod10, and Lasix20-1/2tab daily; BP= 130/70 & she denies HA, dizzy, CP, palpit, SOB, edema, etc. ~  1/15: BP is regulated w/ MetopER50, Amlod10, Lasix20-1/2Qam; BP= 110/68 & she denies CP, palpit, SOB, edema, etc. ~  7/15: BP well controlled on Metop50, Amlod10, Lasix20-1/2 daily; BP= 130/70 & she denies CP, palpit, SOB, edema, or cerebral ischemic symptoms. ~  1/16: on Metop50, Amlod10, Lasix20-1/2daily prn; BP= 128/78 & she denies CP, palpit, dizzy, SOB, edema, etc. ~  1/17:  BP remains under good control...  MITRAL VALVE PROLAPSE (ICD-424.0) - clinical diagnosis in the past... no recent symptoms of CP or palpit... can't find prev  2DEcho...  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 CDoppler's w/ non-obstructive plaque right carotid bulb, no signif ICA stenoses... ~  f/u CDoppler 5/10 showed mild plaque bilat, 0-39% blat ICA stenoses... f/u 13yr.15yrf/u CDoppler 5/11 showed mild plaque bilat, 0-39% bilat ICA stenoses, no change, f/u 13yr. 45yr/u CDoppler 4/12 showed mild plaque in bulbs, 0-39% bilat ICA stenoses, stable, repeat rec in 49yrs. 30yr/u CDopplers 4/14 showed mild heterogeneous plaque bilat, stable 0-39% bilat ICA stenoses, f/u 2 yrs... ~  f/u CDopplers 5/16 showed heterogeneous plaque bilat, progression of the right stenosis to 40-59% 16-38% stable LICA at 0-39%; 4-53%yr... 74yrERLIPIDEMIA (ICD-272.4) - on SIMVASTATIN 64mg/d..11me tried off meds 3/09 due to leg pains- but further eval showed HNP...  ~  pre-treatment FLP 3/04 showed TChol 282, TG 210, HDL 56, LDL 184... statin Rx started... ~  f/u FLP's on Statin Rx were good:  TChol 150-166, TG 87-119, HDL 35-49, LDL 86-107... ~  FLP 10/08[place] Zocor20/d showed TChol 174, TG 136, HDL 47, LDL 99... ~  FLP  4/09 off Zocor showed TChol 285, TG 192, HDL 56, LDL 200... rec- restart Zocor20. ~  [place] 11/09 on Simva20 showed TChol 189, Tg 142, HDL 46, LDL 115... rec> continue same. ~  FLP 4/10 on Simva20 showed TChol 193, TG 114, HDL 55, LDL 116... may need incr dose. 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  ~  Endocrine eval by DrKerr 9/10 w/ radioactive iodine therapy- 106 mCi I- 131 given and scan showed only uptake in neck... ~  labs 4/10 showed TSH = 1.30 ~  9/10:  started on thyroid replacement by DrKerr- currently Synthroid 112 micrograms/d. ~  12/10: Synthroid decreased to 1040m/d due to ?elevated BP?... labs followed by DrKerr. ~  9/11:  f/u DrKerr doing well- labs on SYNTHROID 10040md= OK, and I-131 whole body scan reported neg. ~  4/12:  She continues regular f/u DrKerr> TSH= 0.36, Thyroid Ultrasound w/o recurrent or resid thyroid tissue seen... ~  4/13:  F/u DrKerr 4/13 w/ hx papillary thyroid cancer> s/p thyroidectomy & RAI therapy; on Levothy100m56m; ThySonar 4/13> no resid thyroid tissue or mass; "Everything was fine, no sign of cancer coming back" she says. ~  11/13: she saw DrKerr for 38mo 37mo papillary thyr ca, follicular variant- post surg hypothyroidism on Synthroid100; TSH=0.52; Throglob level remains undetectable... ~  Thyroid Ultrasound 6/14 showed no resid thyroid tissue & no adenopathy... ~  She continues regular Q38mo f49mow up visits w/ DrKerr...  COLONIC POLYPS (ICD-211.3) - colonoscopy 8/09 by DrPerry w/ several adenomatous polyps removed... f/u planned 5 yrs. ~  She saw DrPerry 12/12 w/ colonoscopy done> 2 polyps in asc colon= tubular adenomas & f/u suggested in 15yrs..87yrACK PAIN, LUMBAR (ICD-724.2) - eval DrDuda 4/09 w/ HNP L5-S1 and treated w/ epid steroid shot by DrNewton and improved...  OSTEOPENIA (ICD-733.90) - prev on Fosamax but this was stopped by GYN [facility]... she takes  Caltrate, Vits, Vit D... ~  labs 4/09 showed Vit D level = 33 therefore started Vit D OTC 11-1998 daly. ~  labs 4/10 showed Vit D level = 59 on 1000 u daily. ~  pt reports that f/u BMD 9/10 was "excellent"... ~  labs 4/11 showed Vit d level = 39... rec> continue 1000 u daily. 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 stable on AGGRENOX Bid... ~  4/12:  She continues on Aggrenox bid w/o cerebral ischemic symptoms...  Skin Cancer - BCE removed from left ankle by DrNolan 8/10... Another removed from nose & followed by Moh's surg... ~  She has had several Basal cells removed w/ subseq Moh's surg at the Skin cancer center...  Hx Shingles >> she's had the Shingles vaccine... ~  5/13:  She reports sudden right hip pain w/ eval DrHilts- Shingles Rx w/ Valtrex & Vicodin by him... ~  12/13: she had an outbreak on right leg w/ pain; went to her Ortho- DrHilts & treated w/ Valtrex, Pred, Vicodin and resolved...  Health Maintenance:   ~  last colonoscopy 8/09 by DrPerry> see above. ~  GYN= DrRichardson... pt states she is seen every other yr & [facility]. ~  Immunizations:  Tetanus/ TDAP updated 2010;  Pneumovax in 2006 at age 68;  Given Prevnar-13 7/15;  she gets the Flu shots yearly...   Past Surgical History:  Procedure Laterality Date  . basal cell carcinoma removed from inside the left ear  11/04/2012   june 2015  . COLONOSCOPY    . DILATION AND CURETTAGE OF UTERUS    . EYE SURGERY  08/22/2014  . POLYPECTOMY    . TOTAL THYROIDECTOMY      Outpatient Encounter Medications as of 06/02/2017  Medication Sig  . amLODipine (NORVASC) 10 MG tablet Take 1 tablet (10 mg total) by mouth daily.  . Calcium Carbonate-Vitamin D (CALCIUM 600+D) 600-400 MG-UNIT per  tablet Take 1 tablet by mouth daily.   . cetirizine (ZYRTEC) 5 MG tablet Take 5 mg by mouth as needed.    . cholecalciferol (VITAMIN D) 1000 UNITS tablet Take 1,000 Units by mouth daily.  [name] dextromethorphan-guaiFENesin (MUCINEX DM) 30-600 MG per 12 hr tablet Take 1 tablet by mouth as needed.   . dicyclomine (BENTYL) 20 MG tablet Take 1 tablet (20 mg total) by mouth 2 (two) times daily as needed (abdominal cramping).  [name] dipyridamole-aspirin (AGGRENOX) 200-25 MG 12hr capsule Take 1 capsule by mouth 2 (two) times daily.  . furosemide (LASIX) 20 MG tablet Take 1/2 daily as needed for swelling  . Lactobacillus (PROBIOTIC ACIDOPHILUS PO) Take by mouth daily. Reported on 12/03/2015  . levothyroxine (SYNTHROID, LEVOTHROID) 100 MCG tablet Take 100 mcg by mouth daily.    . metoprolol succinate (TOPROL-XL) 50 MG 24 hr tablet TAKE 1 TABLET BY MOUTH EVERY DAY TAKE WITH OR IMMEDIATELY FOLLOWING A MEAL  . Multiple Vitamins-Minerals (CENTRUM SILVER PO) Take 1 tablet by mouth daily.    . simvastatin (ZOCOR) 20 MG tablet Take 1 tablet (20 mg total) by mouth at bedtime.  . traMADol (ULTRAM) 50 MG tablet Take 1 tablet (50 mg total) by mouth 3 (three) times daily as needed.  . vitamin E 400 UNIT capsule Take 400 Units by mouth daily.  . [DISCONTINUED] dipyridamole-aspirin (AGGRENOX) 200-25 MG 12hr capsule Take 1 capsule by mouth 2 (two) times daily.  . [DISCONTINUED] metoprolol succinate (TOPROL-XL) 50 MG 24 hr tablet TAKE 1 TABLET BY MOUTH EVERY DAY TAKE WITH OR IMMEDIATELY FOLLOWING A MEAL  . [DISCONTINUED] simvastatin (ZOCOR) 20 MG tablet Take 1 tablet (20 mg total) by mouth at bedtime.   Facility-Administered Encounter Medications as of 06/02/2017  Medication  . 0.9 %  sodium chloride infusion  . 0.9 %  sodium chloride infusion    Allergies  Allergen Reactions  .  Sulfonamide Derivatives     REACTION: rash    Immunization History  Administered Date(s) Administered  . H1N1 10/09/2008  . Influenza Split  07/20/2011, 07/14/2012  . Influenza Whole 07/25/2008, 08/09/2009, 07/25/2010  . Influenza, High Dose Seasonal PF 08/21/2016, 08/20/2017  . Influenza,inj,Quad PF,6+ Mos 07/26/2013, 07/30/2014, 07/30/2015  . Pneumococcal Conjugate-13 05/24/2014  . Pneumococcal Polysaccharide-23 11/02/2006  . Td 02/27/2009  . Zoster 11/02/2010  . Zoster Recombinat (Shingrix) 09/01/2017    Current Medications, Allergies, Past Medical History, Past Surgical History, Family History, and Social History were reviewed in [employer] record.    Review of Systems        See HPI - all other systems neg except as noted... The patient denies anorexia, fever, weight loss, weight gain, vision loss, decreased hearing, hoarseness, chest pain, syncope, dyspnea on exertion, peripheral edema, prolonged cough, headaches, hemoptysis, abdominal pain, melena, hematochezia, severe indigestion/heartburn, hematuria, incontinence, muscle weakness, suspicious skin lesions, transient blindness, difficulty walking, depression, unusual weight change, abnormal bleeding, enlarged lymph nodes, and angioedema.     Objective:   Physical Exam     WD, WN, 77 y/o WF in NAD... GENERAL:  Alert & oriented; pleasant & cooperative... HEENT:  [place]/AT, EOM-wnl, PERRLA, pterygium left eye, EACs-clear, TMs-wnl, NOSE-clear, THROAT-clear & wnl. NECK:  Supple w/ fairROM; no JVD; normal carotid impulses w/o bruits; scar of prev thyroid surg, no nodules, no adenopathy. CHEST:  Clear to P & A; without wheezes/ rales/ or rhonchi. HEART:  Regular Rhythm; without murmurs/ rubs/ or gallops. ABDOMEN:  Soft & nontender; normal bowel sounds; no organomegaly or masses detected. EXT: without deformities or arthritic changes; no varicose veins/ venous insuffic/ or edema. NEURO:  CN's intact; motor testing normal; sensory testing normal; gait normal & balance OK. DERM:  s/p surg behind left pinna  RADIOLOGY DATA:  Reviewed in the EPIC EMR &  discussed w/ the patient...  LABORATORY DATA:  Reviewed in the EPIC EMR & discussed w/ the patient...   Assessment & Plan:    HBP>  Controlled on meds, tol well, asymptomatic, continue same...  Cerebrovasc Dis & hx TIA>  She remains asymptomatic w/o cerebral ischemic symptoms, continue Agrennox...  HYPERLIPID>  Stable on diet + Simva20...  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  GI> Hx polyps>  She is up to date and had f/u colonoscopy 12/12 as above... 1/17>  Try Bentyl for IBS like symptoms...  Osteopenia>  Managed by GYN DrRichardson & pt reports that her last BMD was "excellent"...  Skin Cancer>  She reports Moh's surg for BCE on nose, plus several "keratoses"; new lesion inside left pinna w/ surg pending...  Other medical issues as noted...   Patient's Medications  New Prescriptions   No medications on file  Previous Medications   AMLODIPINE (NORVASC) 10 MG TABLET    Take 1 tablet (10 mg total) by mouth daily.   CALCIUM CARBONATE-VITAMIN D (CALCIUM 600+D) 600-400 MG-UNIT PER TABLET    Take 1 tablet by mouth daily.    CETIRIZINE (ZYRTEC) 5 MG TABLET    Take 5 mg by mouth as needed.     CHOLECALCIFEROL (VITAMIN D) 1000 UNITS TABLET    Take 1,000 Units by mouth daily.   DEXTROMETHORPHAN-GUAIFENESIN (MUCINEX DM) 30-600 MG PER 12 HR TABLET    Take 1 tablet by mouth as needed.    DICYCLOMINE (BENTYL) 20 MG TABLET    Take 1 tablet (20 mg  total) by mouth 2 (two) times daily as needed (abdominal cramping).   FUROSEMIDE (LASIX) 20 MG TABLET    Take 1/2 daily as needed for swelling   LACTOBACILLUS (PROBIOTIC ACIDOPHILUS PO)    Take by mouth daily. Reported on 12/03/2015   LEVOTHYROXINE (SYNTHROID, LEVOTHROID) 100 MCG TABLET    Take 100 mcg by mouth daily.     MULTIPLE VITAMINS-MINERALS (CENTRUM SILVER PO)    Take 1 tablet by mouth daily.     TRAMADOL (ULTRAM) 50 MG TABLET    Take 1 tablet (50 mg total) by mouth 3  (three) times daily as needed.   VITAMIN E 400 UNIT CAPSULE    Take 400 Units by mouth daily.  Modified Medications   Modified Medication Previous Medication   DIPYRIDAMOLE-ASPIRIN (AGGRENOX) 200-25 MG 12HR CAPSULE dipyridamole-aspirin (AGGRENOX) 200-25 MG 12hr capsule      Take 1 capsule by mouth 2 (two) times daily.    Take 1 capsule by mouth 2 (two) times daily.   METOPROLOL SUCCINATE (TOPROL-XL) 50 MG 24 HR TABLET metoprolol succinate (TOPROL-XL) 50 MG 24 hr tablet      TAKE 1 TABLET BY MOUTH EVERY DAY TAKE WITH OR IMMEDIATELY FOLLOWING A MEAL    TAKE 1 TABLET BY MOUTH EVERY DAY TAKE WITH OR IMMEDIATELY FOLLOWING A MEAL   SIMVASTATIN (ZOCOR) 20 MG TABLET simvastatin (ZOCOR) 20 MG tablet      Take 1 tablet (20 mg total) by mouth at bedtime.    Take 1 tablet (20 mg total) by mouth at bedtime.  Discontinued Medications   No medications on file

## 2017-12-06 ENCOUNTER — Encounter: Payer: Self-pay | Admitting: Pulmonary Disease

## 2017-12-06 ENCOUNTER — Ambulatory Visit (INDEPENDENT_AMBULATORY_CARE_PROVIDER_SITE_OTHER): Payer: Medicare Other | Admitting: Pulmonary Disease

## 2017-12-06 ENCOUNTER — Other Ambulatory Visit (INDEPENDENT_AMBULATORY_CARE_PROVIDER_SITE_OTHER): Payer: Medicare Other

## 2017-12-06 ENCOUNTER — Ambulatory Visit (INDEPENDENT_AMBULATORY_CARE_PROVIDER_SITE_OTHER)
Admission: RE | Admit: 2017-12-06 | Discharge: 2017-12-06 | Disposition: A | Discharge status: Home / Self Care | Payer: Medicare Other | Source: Ambulatory Visit | Attending: Pulmonary Disease | Admitting: Pulmonary Disease

## 2017-12-06 VITALS — BP 110/70 | HR 81 | Temp 97.9°F | Ht 61.0 in | Wt 126.0 lb

## 2017-12-06 DIAGNOSIS — I10 Essential (primary) hypertension: Secondary | ICD-10-CM | POA: Diagnosis not present

## 2017-12-06 DIAGNOSIS — C73 Malignant neoplasm of thyroid gland: Secondary | ICD-10-CM | POA: Diagnosis not present

## 2017-12-06 DIAGNOSIS — G459 Transient cerebral ischemic attack, unspecified: Secondary | ICD-10-CM

## 2017-12-06 DIAGNOSIS — M949 Disorder of cartilage, unspecified: Secondary | ICD-10-CM

## 2017-12-06 DIAGNOSIS — E032 Hypothyroidism due to medicaments and other exogenous substances: Secondary | ICD-10-CM | POA: Diagnosis not present

## 2017-12-06 DIAGNOSIS — E782 Mixed hyperlipidemia: Secondary | ICD-10-CM | POA: Diagnosis not present

## 2017-12-06 DIAGNOSIS — M899 Disorder of bone, unspecified: Secondary | ICD-10-CM | POA: Diagnosis not present

## 2017-12-06 DIAGNOSIS — D126 Benign neoplasm of colon, unspecified: Secondary | ICD-10-CM | POA: Diagnosis not present

## 2017-12-06 DIAGNOSIS — I679 Cerebrovascular disease, unspecified: Secondary | ICD-10-CM

## 2017-12-06 LAB — VITAMIN D 25 HYDROXY (VIT D DEFICIENCY, FRACTURES): VITD: 52.46 ng/mL (ref 30.00–100.00)

## 2017-12-06 LAB — CBC WITH DIFFERENTIAL/PLATELET
BASOS PCT: 0.5 % (ref 0.0–3.0)
Basophils Absolute: 0 10*3/uL (ref 0.0–0.1)
EOS PCT: 2.3 % (ref 0.0–5.0)
Eosinophils Absolute: 0.1 10*3/uL (ref 0.0–0.7)
HCT: 42.2 % (ref 36.0–46.0)
HEMOGLOBIN: 14.4 g/dL (ref 12.0–15.0)
LYMPHS ABS: 1.5 10*3/uL (ref 0.7–4.0)
Lymphocytes Relative: 24.7 % (ref 12.0–46.0)
MCHC: 34.1 g/dL (ref 30.0–36.0)
MCV: 90.1 fl (ref 78.0–100.0)
MONO ABS: 0.5 10*3/uL (ref 0.1–1.0)
MONOS PCT: 8.1 % (ref 3.0–12.0)
NEUTROS PCT: 64.4 % (ref 43.0–77.0)
Neutro Abs: 4 10*3/uL (ref 1.4–7.7)
Platelets: 284 10*3/uL (ref 150.0–400.0)
RBC: 4.69 Mil/uL (ref 3.87–5.11)
RDW: 13 % (ref 11.5–15.5)
WBC: 6.2 10*3/uL (ref 4.0–10.5)

## 2017-12-06 LAB — COMPREHENSIVE METABOLIC PANEL
ALK PHOS: 53 U/L (ref 39–117)
ALT: 22 U/L (ref 0–35)
AST: 19 U/L (ref 0–37)
Albumin: 4.5 g/dL (ref 3.5–5.2)
BILIRUBIN TOTAL: 0.7 mg/dL (ref 0.2–1.2)
BUN: 14 mg/dL (ref 6–23)
CO2: 29 mEq/L (ref 19–32)
CREATININE: 0.62 mg/dL (ref 0.40–1.20)
Calcium: 9.3 mg/dL (ref 8.4–10.5)
Chloride: 100 mEq/L (ref 96–112)
GFR: 98.98 mL/min (ref >60.00)
GLUCOSE: 101 mg/dL — AB (ref 70–99)
Potassium: 3.7 mEq/L (ref 3.5–5.1)
Sodium: 139 mEq/L (ref 135–145)
TOTAL PROTEIN: 7.5 g/dL (ref 6.0–8.3)

## 2017-12-06 LAB — LIPID PANEL
Cholesterol: 159 mg/dL (ref 0–200)
HDL: 63.9 mg/dL (ref >39.00)
LDL Cholesterol: 77 mg/dL (ref 0–99)
NONHDL: 95.58
TRIGLYCERIDES: 91 mg/dL (ref 0.0–149.0)
Total CHOL/HDL Ratio: 2
VLDL: 18.2 mg/dL (ref 0.0–40.0)

## 2017-12-06 LAB — TSH: TSH: 0.43 u[IU]/mL (ref 0.35–4.50)

## 2017-12-06 MED ORDER — AMLODIPINE BESYLATE 10 MG PO TABS: 10.0000 mg | ORAL_TABLET | Freq: Every day | ORAL | 3 refills | Status: DC

## 2017-12-06 MED ORDER — ASPIRIN-DIPYRIDAMOLE ER 25-200 MG PO CP12: 1.0000 | ORAL_CAPSULE | Freq: Two times a day (BID) | ORAL | 3 refills | Status: DC

## 2017-12-06 MED ORDER — SIMVASTATIN 20 MG PO TABS: 20.0000 mg | ORAL_TABLET | Freq: Every day | ORAL | 3 refills | Status: DC

## 2017-12-06 MED ORDER — FUROSEMIDE 20 MG PO TABS: ORAL_TABLET | ORAL | 5 refills | Status: DC

## 2017-12-06 MED ORDER — METOPROLOL SUCCINATE ER 50 MG PO TB24: ORAL_TABLET | ORAL | 3 refills | Status: DC

## 2017-12-06 NOTE — Progress Notes (Signed)
Subjective:    Patient ID: [name], female    DOB: [birth year], 77 y.o.   MRN: [id number]  HPI 77 y/o WF here for a follow up visit... she has mult med problems as noted below> ~  SEE PREV EPIC NOTES FOR OLDER DATA >>     CXR 1/14 showed norm heart size, clear lungs-wnl, clips in lower neck from thyroid surg...  LABS 1/14: FLP- at goals on Simva20;  Chems- wnl;  CBC- wnl;  VitD= 69;  Thyroid per DrKerr...  LABS 1/15:  FLP- at goals on Simva20;  Chems- wnl;  CBC- wnl;  TSH=0.42 on Levo100...    ~  November 28, 2014:  51moROV & LGoldatells me she had left eye surg 10/15 by DrMSpencer (?pterigium)) w/ post op hemorrhage prob due to Aggrenox rx;  She also had an URI 12/15 & called in- given Augmentin & Pred=> symptoms resolved... We reviewed the following medical problems during today's office visit >>     AR> on Zyrtek, Mucinex; stable on prn med rx...    HBP, MVP> on Metop50, Amlod10, Lasix20-1/2daily prn; BP= 128/78 & she denies CP, palpit, dizzy, SOB, edema, etc...    Cerebrovasc dis & TIA> on Aggrenox25-200Bid; she remains asymptomatic w/o cerebral ischemic symptoms...    CHOL> on Simva20; FLP 1/16 shows TChol 173, TG 143, HDL 51, LDL 93; continue same Rx & diet efforts...    Thyroid cancer & hypothy> on Synthroid100; followed by DrKerr Q635moseen 6/15 & doing well...    GI- colon polyps> followed by DrPerry w/ last colon 12/12 showing 2 sm adenomatous polyps removed & f/u planned 5y6yr.    DJD right shoulder & LBP> off Vicodin & using Tramadol50 prn; she had eval DrDuda (given 2 shots in shoulder 2015 & improved) [facility]; doing well w/o signif recurrent pain...    Osteopenia> on calcium, MVI, VitD; prev on Fosamax but stopped by GYN ?2008 & they follow her BMDs...    Skin Cancer> new- in left ext ear canal w/ surg planned by Derm 1/14...    Shingles> she had the shingles vaccine prev; developed rt leg rash/pain & eval by Ortho- DrHilts 12/13- treated w/  Valtrex, Pred, Vicodin & resolved... We reviewed prob list, Bush, xrays and labs> see below for updates >> she had the 2015 flu vaccine 9/15 7 is up to date on all vaccinations...   CXR 1/16 showed norm heart size, calcif in Ao, clear lungs, NAD...   LABS 1/16:  FLP- at goals on Simva20;  Chems- wnl;  CBC- wnl;  TSH=0.40 on synth100;  VitD=51 on 1000u daily...   ~  May 29, 2015:  16mo75mo & [name] but has mult somatic complaints- notes another epidural steroid shot 5/16 by DrNewton after failing Pred (she states intol "it tore up nmy stomach"); under stress since her brother had a stroke;  She has some dysuria & wants a urinalysis thinking that the Pred gave her a UTI...    BP stable on Amlod10, MetopER50, Lasix20-1/2 prn; BP=138/72 & she denies HAs, CP, palpit, SOB, edema...     She remains on Aggrenox Bid w/o cerebral ischemic symptoms, stable...    Lipids have been at goal on diet + Simva20...    Thyroid stable on Synthroid100...    She takes calcium, Vits, probiotic, Tramadol prn... EXAM shows Afeb, VSS, O2sat=100% on RA;  HEENT- neg, mallampati2;  Chest- clear w/o w/r/r;  Heart- RR w/o m/r/g;  AQbd- soft, nontender;  Ext- neg w/o c/c/e;  Neuro- neg, intact... IMP/PLAN>>  [name]- refilled per request; she already had the 2016 Flu vaccine & up to date; ROV 70mo..  ~  December 03, 2015:  650moOScotiaeports that she had a skin cancer removed from under her left eye at the skin cancer center DrAlbertini;  She has mult somatic complaints including left ear discomfort, & lower abd pain x several days- we discussed IBS & Rx trial w/ Bentyl vs referral to GI- DrPerry    AR> on Zyrtek, Mucinex; stable on prn med rx...    HBP, MVP> on Metop50, Amlod10, Lasix20-1/2daily prn; BP= 138/72 & she denies CP, palpit, dizzy, SOB, edema, etc...    Cerebrovasc dis & TIA> on Aggrenox25-200Bid; she remains asymptomatic w/o cerebral ischemic symptoms...    CHOL> on Simva20;  FLP 1/17 shows TChol 168, TG 128, HDL 58, LDL 84; continue same Rx & diet efforts...    Thyroid cancer & hypothy> s/p thyroid surg, on Synthroid100; followed by DrKerr Q6m77modoing well; Labs here 1/17 showed TSH= 0.37    GI- colon polyps> followed by DrPerry w/ last colon 12/12 showing 2 sm adenomatous polyps removed & f/u planned 11yr68yr/o IBS like symptoms 1/17- trial Bentyl20 tid prn...    DJD right shoulder & LBP> off Vicodin & using Tramadol50 prn; she had eval DrDuda (given 2 shots in shoulder 2015 & improved) [facility]; doing well w/o signif recurrent pain...    Osteopenia> on calcium, MVI, VitD; prev on Fosamax but stopped by GYN ?2008 & they follow her BMDs...    Skin Cancer> new- in left ext ear canal w/ surg by DermPayton Mccallum4...    Shingles> she had the shingles vaccine prev; developed rt leg rash/pain & eval by Ortho- DrHilts 12/13- treated w/ Valtrex, Pred, Vicodin & resolved... EXAM shows Afeb, VSS, O2sat=98% on RA;  HEENT- neg x cerumen, mallampati2;  Chest- clear w/o w/r/r;  Heart- RR w/o m/r/g;  AQbd- soft, nontender;  Ext- neg w/o c/c/e;  Neuro- neg, intact...  CXR 12/03/15> norm heart size, aortic calcif is noted, clear lungs/ NAD...  Marland KitchenMarland KitchenKG 12/03/15>  NSR, rate64, wnl/ NAD....  LABS 12/03/15>  FLP- at goals on simva20- continue same;  Chems- wnl on diet alone;  CBC- wnl;  TSH=0.37 on Synthroid100 per drKerr;  VitD level = 56...  IMP/PLAN>>  [name] & f/u w/ DrPerry if not responding;  Also needs ENT for cerumen impactions; due to Gyn check w/ DrRichardson...  ~  June 01, 2016:  17mo 108mo&Renner Cornerrts that the Bentyl prescribed last OV really helped her IBS Bush & she continues using it Bid prn;  She reports otherw doing satis- no new complaints or concerns...     She remains on Aggrenox Bid, MetopER50, Amlod10, Lasiz20 prn; BP=126/74 & she denies CP, palpit, SOB, edema, and no cerebral ischemic symptoms.    FLP looks  good on Simva20 & diet...    She remains on Synthroid100 per DrKerr & is clinically & biochem euthyroid...  EXAM shows Afeb, VSS, O2sat=98% on RA;  HEENT- neg x cerumen, mallampati2;  Chest- clear w/o w/r/r;  Heart- RR w/o m/r/g;  AQbd- soft, nontender;  Ext- neg w/o c/c/e;  Neuro- neg, intact... IMP/PLAN>>  LouisTameikatable, we reflled Bush per request, continue same + diet/ exercise...   ~  December 03, 2016:  30moROV & [name] reports a stable interval- no new complaints or concerns;  She's had Bilat cataract surg in [id number] DrShapiro & doing satis;  She saw GYN- DrRichardson 09/2016 & stable- Mammogram was neg w/ dense breasts and BMD (last 01/2016) showed lowest [facility]... We reviewed the following medical problems during today's office visit >>     AR> on Zyrtek, Mucinex; stable on prn med rx...    HBP, MVP> on Metop50, Amlod10, Lasix20-1/2daily prn; BP= 118/64 & she denies CP, palpit, dizzy, SOB, edema, etc...    Cerebrovasc dis & TIA> on Aggrenox25-200Bid; she remains asymptomatic w/o cerebral ischemic symptoms...    CHOL> on Simva20; FLP 1/18 shows TChol 165, TG 109, HDL 51, LDL 92; continue same Rx & diet efforts...    Thyroid cancer & hypothy> s/p thyroid surg, on Synthroid100; followed by DrKerr Q631mo doing well; Labs here 1/18 showed TSH= 0.48    GI- colon polyps> followed by DrPerry w/ last colon 12/12 showing 2 sm adenomatous polyps removed & f/u planned 5y76yrc/o IBS like symptoms 1/17- trial Bentyl20 tid prn; she will call GI for f/u colon......Marland KitchenMarland KitchenMarland Kitchen DJD right shoulder & LBP> off Vicodin & using Tramadol50 prn; she had eval DrDuda (given 2 shots in shoulder 2015 & improved) & [facility]; doing well w/o signif recurrent pain (wears back brace when working    Osteopenia> on calcium, MVI, VitD; prev on Fosamax but stopped by GYN ?2008 & they follow her BMDs=> last 3/17 showing lowest Tscore -2.2 in left FemNeck on Calcium, MVI, VitD,  wt bearing exercise (consider Prolia)......    Skin Cancer> new- in left ext ear canal w/ surg by DerPayton Mccallum14...    Shingles> she had the shingles vaccine prev; developed rt leg rash/pain & eval by Ortho- DrHilts 12/13- treated w/ Valtrex, Pred, Vicodin & resolved... EXAM shows Afeb, VSS, O2sat=99% on RA;  HEENT- neg x cerumen, mallampati2;  Chest- clear w/o w/r/r;  Heart- RR Gr1/6 SEM w/o r/g;  Abd- soft, nontender;  Ext- neg w/o c/c/e;  Neuro- neg, intact...  LABS 12/03/16:  FLP- all parameters at goals on Simva20;  Chems- wnl;  CBC- wnl;  TSH=0.48;  VitD=51;   IMP/PLAN>>  Jolie remains stable overall, rec to continue same Bush (refilled today per request), she is due for a colonoscopy w/ DrPerry & we will refer; she knows to call for any problems otherw follow up in ~34mo10mo  ~  June 02, 2017:  34mo 24mo& [name] doing well but noticed a sharp pain in the center of her chest today, resolved spont, no pain prev or since & not assoc w/ paplit, dizziness, syncope, SOB, edema, etc...    She had Ophthal check from DrShapiro 03/01/17> s/p cat surg w/ improved vision, some dryness, doing satis post op & asked to ret 45yr..85yr She had Colonoscopy 04/12/17 by DrPerry>  Tiny 1mm po7m removed, int hems noted, otherw neg... Polyp was a tubular adenoma & rec for f/u colon in 36yrs...89yr She saw ENDO-DrKerr 04/21/17>  Post surg hypothyroid, hx papillary adenoCa of thyroid, hx low bone mass (managed by GYN), fam hx DM w/ abn BS levels;  Labs showed BS=100, on Bisphos drug holiday, TSH=0.40, thyroglob antibody NEG...  We reviewed the following medical problems during today's office visit >>     HBP, MVP, cerebrovasc dis & TIA> on  Metop50, Amlod10, Lasix20-1/2daily prn, + Aggrenox25-200Bid; BP=110/70 & she denies CP, palpit, dizzy, weakness, etc...    Chol ok on Simva20 & Sugars ok on diet alone- followed by DrKerr...    Hx thyroid ca & on Synthroid100 w/TSH=0.40 by DrKerr recently...    Low bone mass followed  & managed by GYN- on bisphos drug holiday at this time...  EXAM shows Afeb, VSS, O2sat=98% on RA;  HEENT- neg x cerumen, mallampati2;  Chest- clear w/o w/r/r;  Heart- RR Gr1/6 SEM w/o r/g;  Abd- soft, nontender;  Ext- neg w/o c/c/e;  Neuro- neg, intact... IMP/PLAN>>  [name] is stable- continue current rx & we plan ROV recheck in 42mow/ CPX- labs, CXR, EKG, 2DEcho...   ~  December 06, 2017:  675moOV & [name] has had some stress recently- her brother died last month (hx heart dis, CHF, strokes, dementia- and she developed lightheadedness w/ syncopal spell "everything went black" she says but she did not lose consciousness she says & it lasted a few seconds;  She wonders if she needs MRI brain, CDopplers and I offered to schedule these tests for her but she has decided to wait;  She has no focal neuro symptoms and denies HA, confusion, weakness, numbness, tingling, etc... otherw she reports feeling OK- no CP, palpit, edema; no cough, phlegm, dyspnea; no f/c/s etc...  We reviewed the following interval medical visits since Aug2018>      She saw ENDO- DrKerr on 07/23/17> f/u for post surg hypothyroid, hx papillary adenoCa of thyroid, low bone mass, +fam hx DM; Labs showed TSH=0.87 and Thyroglobulin level was <0.1; Chems are wnl w/ BS=100, Cr=0.63, LFTs wnl...  We reviewed the following medical problems during today's office visit>      AR> on Zyrtek, Mucinex; stable on prn med rx...    HBP, MVP> on Metop50, Amlod10, Lasix20-1/2daily prn; BP= 110/70 & she denies CP, palpit, dizzy, SOB, edema, etc...    Cerebrovasc dis & TIA> on Aggrenox25-200Bid; she remains asymptomatic w/o cerebral ischemic symptoms; she had lightheaded episode w/ ?syncope 11/2017 during period of incr stress w/ passing of her brother, no residual symotoms, no focal symptoms, & no recurrence...    CHOL> on Simva20; [place] shows TChol 159, TG 91, HDL 64, LDL 77; continue same Rx & diet efforts...    Thyroid cancer & hypothy> s/p thyroid surg,  on Synthroid100; followed by DrKerr Q6m30modoing well; Labs followed by endocrine w/ euthyroid status on replacement hormone and undetectable thyroglobulin levels...    GI- colon polyps> followed by DrPerry w/ last colon 6/18 showing 1 tiny adenomatous polyps removed & f/u planned 97yr66yr/o IBS like symptoms 1/17- trial Bentyl20 tid prn.     DJD right shoulder & LBP> off Vicodin & using Tramadol50 prn; she had eval DrDuda (given 2 shots in shoulder 2015 & improved) [facility]; doing well w/o signif recurrent pain (wears back brace when working    Osteopenia> on calcium, MVI, VitD; prev on Fosamax but stopped by GYN ?2008 & they follow her BMDs=> last 3/17 showing lowest Tscore -2.2 in left FemNeck on Calcium, MVI, VitD, wt bearing exercise (consider Prolia)...    Skin Cancer> new- in left ext ear canal w/ surg by DermPayton Mccallum4...    Shingles> she had the shingles vaccine prev; developed rt leg rash/pain & eval by Ortho- DrHilts 12/13- treated w/ Valtrex, Pred, Vicodin & resolved... EXAM shows Afeb, VSS, O2sat=97% on RA;  HEENT- neg x cerumen,  mallampati2;  Chest- clear w/o w/r/r;  Heart- RR Gr1/6 SEM w/o r/g;  Abd- soft, nontender;  Ext- neg w/o c/c/e;  Neuro- neg, intact...  CXR 12/06/17>  Norm heart size, atherosclerosis in Ao, clear lungs- NAD...  LABS 12/06/17>  FLP- all parameters at goals on Simva20;  Chems- wnl;  CBC- wnl;  TSH=0.43;  VitD=52 IMP/PLAN>>  [name] had a scare w/ the stress of her brother's passing, and ?brief syncopal spell;  No recurrent problems so far- we discussed checking MRI & CDopplers but she wants to wait since she has not had any recurrent symptoms so far;  CXR & labs look good- she is stable & exercising;  We plan ROV recheck in 35mo sooner for any problems...         Problem List:  ALLERGIC RHINITIS (ICD-477.9) - we discussed Rx w/ Zyrtek, Saline, Mucinex OTC...  HYPERTENSION (ICD-401.9) - on METOPROLOL XL '50mg'$ /d, NORVASC '10mg'$ /d, & LASIX '20mg'$ - 1/2 tab  daily...   ~  CXR 12/10 showed mild cardiomeg, clear lungs, NAD... ~  10/11:  BP=134/86, doing well w/ med + home BP checks... denies HA, fatigue, visual changes, CP, palipit, dizziness, syncope, dyspnea, edema, etc... ~  4/12:  BP= 120/76 & she continues to remain asymptomatic... ~  11/12:  BP= 122/72 & she continues stable w/o CP, palpit, dizzy, SOB, edema, or cerebral ischemic symptoms on her Aggrenox Bid. ~  5/13:  BP= 138/72 & she denies CP, palpit, SOB, edema, etc... ~  1/14: on Metop50, Amlod10, Lasix20-1/2daily; BP= 138/76 & she denies CP, palpit, dizzy, SOB, edema, etc... ~  CXR 1/14 showed normal heart size, clear lungs, surg clips in low neck, NAD..[name] ~  7/14: BP is controlled on MetopER50-1/2 tab, Amlod10, and Lasix20-1/2tab daily; BP= 130/70 & she denies HA, dizzy, CP, palpit, SOB, edema, etc. ~  1/15: BP is regulated w/ MetopER50, Amlod10, Lasix20-1/2Qam; BP= 110/68 & she denies CP, palpit, SOB, edema, etc. ~  7/15: BP well controlled on Metop50, Amlod10, Lasix20-1/2 daily; BP= 130/70 & she denies CP, palpit, SOB, edema, or cerebral ischemic symptoms. ~  1/16: on Metop50, Amlod10, Lasix20-1/2daily prn; BP= 128/78 & she denies CP, palpit, dizzy, SOB, edema, etc. ~  1/17:  BP remains under good control...  MITRAL VALVE PROLAPSE (ICD-424.0) - clinical diagnosis in the past... no recent symptoms of CP or palpit... can't find prev 2DEcho...  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 CDoppler's w/ non-obstructive plaque right carotid bulb, no signif ICA stenoses... ~  f/u CDoppler 5/10 showed mild plaque bilat, 0-39% blat ICA stenoses... f/u 13yr~  f/u CDoppler 5/11 showed mild plaque bilat, 0-39% bilat ICA stenoses, no change, f/u 1y14yr  f/u CDoppler 4/12 showed mild plaque in bulbs, 0-39% bilat ICA stenoses, stable, repeat rec in 6yr63yr  f/u CDopplers 4/14 showed mild heterogeneous plaque bilat,  stable 0-39% bilat ICA stenoses, f/u 2 yrs... ~  f/u CDopplers 5/16 showed heterogeneous plaque bilat, progression of the right stenosis to 40-504-54%ge, stable LICA at 0-390-98%u 72yr.37yrHYPERLIPIDEMIA (ICD-272.4) - on SIMVASTATIN '20mg'$ /d... she tried off Bush 3/09 due to leg pains- but further eval showed HNP...  ~  pre-treatment FLP 3/04 showed TChol 282, TG 210, HDL 56, LDL 184... statin Rx started... ~  f/u FLP's on Statin Rx were good:  TChol 150-166, TG 87-119, HDL 35-49, LDL 86-107... ~  FLP 1[place]8 on Zocor20/d showed TChol 174, TG 136, HDL 47, LDL 99... ~  FLP 4/09 off Zocor showed TChol 285, TG 192, HDL 56, LDL 200... rec- restart Zocor20. ~  [place] 11/09 on Simva20 showed TChol 189, Tg 142, HDL 46, LDL 115... rec> continue same. ~  FLP 4/10 on Simva20 showed TChol 193, TG 114, HDL 55, LDL 116... may need incr dose. 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  ~  Endocrine eval by DrKerr 9/10 w/ radioactive iodine therapy- 106 mCi I- 131 given and scan showed only uptake in neck... ~  labs 4/10 showed TSH = 1.30 ~  9/10:  started on thyroid replacement by DrKerr- currently  Synthroid 112 micrograms/d. ~  12/10: Synthroid decreased to 1075m/d due to ?elevated BP?... labs followed by DrKerr. ~  9/11:  f/u DrKerr doing well- labs on SYNTHROID 10026md= OK, and I-131 whole body scan reported neg. ~  4/12:  She continues regular f/u DrKerr> TSH= 0.36, Thyroid Ultrasound w/o recurrent or resid thyroid tissue seen... ~  4/13:  F/u DrKerr 4/13 w/ hx papillary thyroid cancer> s/p thyroidectomy & RAI therapy; on Levothy100m95m; ThySonar 4/13> no resid thyroid tissue or mass; "Everything was fine, no sign of cancer coming back" she says. ~  11/13: she saw DrKerr for 52mo 44mo papillary thyr ca, follicular variant- post surg hypothyroidism on Synthroid100; TSH=0.52; Throglob level remains undetectable... ~  Thyroid Ultrasound 6/14 showed no resid thyroid tissue & no adenopathy... ~  She continues regular Q52mo f73mow up visits w/ DrKerr...  COLONIC POLYPS (ICD-211.3) - colonoscopy 8/09 by DrPerry w/ several adenomatous polyps removed... f/u planned 5 yrs. ~  She saw DrPerry 12/12 w/ colonoscopy done> 2 polyps in asc colon= tubular adenomas & f/u suggested in 53yrs..63yrACK PAIN, LUMBAR (ICD-724.2) - eval DrDuda 4/09 w/ HNP L5-S1 and treated w/ epid steroid shot by DrNewton and improved...  OSTEOPENIA (ICD-733.90) - prev on Fosamax but this was stopped by GYN after [facility] (2008[facility]... she takes Caltrate, Vits, Vit D... ~  labs 4/09 showed Vit D level = 33 therefore started Vit D OTC 11-1998 daly. ~  labs 4/10 showed Vit D level = 59 on 1000 u daily. ~  pt reports that f/u BMD 9/10 was "excellent"... ~  labs 4/11 showed Vit d level = 39... rec> continue 1000 u daily. 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 stable on AGGRENOX  Bid... ~  4/12:  She continues on Aggrenox bid w/o cerebral ischemic symptoms...  Skin Cancer - BCE removed from left ankle by DrNolan 8/10... Another removed from nose & followed by Moh's surg... ~  She has had several Basal cells removed w/ subseq Moh's surg at the Skin cancer center...  Hx Shingles >> she's had the Shingles vaccine... ~  5/13:  She reports sudden right hip pain w/ eval DrHilts- Shingles Rx w/ Valtrex & Vicodin by him... ~  12/13: she had an outbreak on right leg w/ pain; went to her Ortho- DrHilts & treated w/ Valtrex, Pred, Vicodin and resolved...  Health Maintenance:   ~  last colonoscopy 8/09 by DrPerry> see above. ~  GYN= DrRichardson... pt states she is seen every other yr & Mammogram/ BMD at [facility]. ~  Immunizations:  Tetanus/ TDAP updated 2010;  Pneumovax in 2006 at age 86;  Given Prevnar-13 7/15;  she gets the Flu shots yearly...   Past Surgical History:  Procedure Laterality Date  . basal cell carcinoma removed from inside the left ear  11/04/2012   june 2015  . COLONOSCOPY    . DILATION AND CURETTAGE OF UTERUS    . EYE SURGERY  08/22/2014  . POLYPECTOMY    . TOTAL THYROIDECTOMY      Outpatient Encounter Medications as of 12/06/2017  Medication Sig  . amLODipine (NORVASC) 10 MG tablet Take 1 tablet (10 mg total) by mouth daily.  . Calcium Carbonate-Vitamin D (CALCIUM 600+D) 600-400 MG-UNIT per tablet Take 1 tablet by mouth daily.   . cetirizine (ZYRTEC) 5 MG tablet Take 5 mg by mouth as needed.    . cholecalciferol (VITAMIN D) 1000 UNITS tablet Take 1,000 Units by mouth daily.  [name] dextromethorphan-guaiFENesin (MUCINEX DM) 30-600 MG per 12 hr tablet Take 1 tablet by mouth as needed.   . dicyclomine (BENTYL) 20 MG tablet Take 1 tablet (20 mg total) by mouth 2 (two) times daily as needed (abdominal Bush).  [name] dipyridamole-aspirin (AGGRENOX) 200-25 MG 12hr capsule Take 1 capsule by mouth 2 (two) times daily.  . furosemide (LASIX) 20 MG tablet Take 1/2  daily as needed for swelling  . Lactobacillus (PROBIOTIC ACIDOPHILUS PO) Take by mouth daily. Reported on 12/03/2015  . levothyroxine (SYNTHROID, LEVOTHROID) 100 MCG tablet Take 100 mcg by mouth daily.    . metoprolol succinate (TOPROL-XL) 50 MG 24 hr tablet TAKE 1 TABLET BY MOUTH EVERY DAY TAKE WITH OR IMMEDIATELY FOLLOWING A MEAL  . Multiple Vitamins-Minerals (CENTRUM SILVER PO) Take 1 tablet by mouth daily.    . simvastatin (ZOCOR) 20 MG tablet Take 1 tablet (20 mg total) by mouth at bedtime.  . traMADol (ULTRAM) 50 MG tablet Take 1 tablet (50 mg total) by mouth 3 (three) times daily as needed.  . vitamin E 400 UNIT capsule Take 400 Units by mouth daily.  . [DISCONTINUED] amLODipine (NORVASC) 10 MG tablet Take 1 tablet (10 mg total) by mouth daily.  . [DISCONTINUED] dipyridamole-aspirin (AGGRENOX) 200-25 MG 12hr capsule Take 1 capsule by mouth 2 (two) times daily.  . [DISCONTINUED] furosemide (LASIX) 20 MG tablet Take 1/2 daily as needed for swelling  . [DISCONTINUED] metoprolol succinate (TOPROL-XL) 50 MG 24 hr tablet TAKE 1 TABLET BY MOUTH EVERY DAY TAKE WITH OR IMMEDIATELY FOLLOWING A MEAL  . [DISCONTINUED] simvastatin (ZOCOR) 20 MG tablet Take 1 tablet (20 mg total) by mouth at bedtime.  . [  DISCONTINUED] 0.9 %  sodium chloride infusion   . [DISCONTINUED] 0.9 %  sodium chloride infusion    No facility-administered encounter medications on file as of 12/06/2017.     Allergies  Allergen Reactions  . Sulfonamide Derivatives     REACTION: rash    Immunization History  Administered Date(s) Administered  . H1N1 10/09/2008  . Influenza Split 07/20/2011, 07/14/2012  . Influenza Whole 07/25/2008, 08/09/2009, 07/25/2010  . Influenza, High Dose Seasonal PF 08/21/2016, 08/20/2017  . Influenza,inj,Quad PF,6+ Mos 07/26/2013, 07/30/2014, 07/30/2015  . Pneumococcal Conjugate-13 05/24/2014  . Pneumococcal Polysaccharide-23 11/02/2006  . Td 02/27/2009  . Zoster 11/02/2010  . Zoster Recombinat  (Shingrix) 09/01/2017    Current Medications, Allergies, Past Medical History, Past Surgical History, Family History, and Social History were reviewed in [employer] record.    Review of Systems        See HPI - all other systems neg except as noted... The patient denies anorexia, fever, weight loss, weight gain, vision loss, decreased hearing, hoarseness, chest pain, syncope, dyspnea on exertion, peripheral edema, prolonged cough, headaches, hemoptysis, abdominal pain, melena, hematochezia, severe indigestion/heartburn, hematuria, incontinence, muscle weakness, suspicious skin lesions, transient blindness, difficulty walking, depression, unusual weight change, abnormal bleeding, enlarged lymph nodes, and angioedema.     Objective:   Physical Exam     WD, WN, 77 y/o WF in NAD... GENERAL:  Alert & oriented; pleasant & cooperative... HEENT:  [place]/AT, EOM-wnl, PERRLA, pterygium left eye, EACs-clear, TMs-wnl, NOSE-clear, THROAT-clear & wnl. NECK:  Supple w/ fairROM; no JVD; normal carotid impulses w/o bruits; scar of prev thyroid surg, no nodules, no adenopathy. CHEST:  Clear to P & A; without wheezes/ rales/ or rhonchi. HEART:  Regular Rhythm; without murmurs/ rubs/ or gallops. ABDOMEN:  Soft & nontender; normal bowel sounds; no organomegaly or masses detected. EXT: without deformities or arthritic changes; no varicose veins/ venous insuffic/ or edema. NEURO:  CN's intact; motor testing normal; sensory testing normal; gait normal & balance OK. DERM:  s/p surg behind left pinna  RADIOLOGY DATA:  Reviewed in the EPIC EMR & discussed w/ the patient...  LABORATORY DATA:  Reviewed in the EPIC EMR & discussed w/ the patient...   Assessment & Plan:    12/06/17>   [name] had a scare w/ the stress of her brother's passing, and ?brief syncopal spell;  No recurrent problems so far- we discussed checking MRI & CDopplers but she wants to wait since she has not had any recurrent  symptoms so far;  CXR & labs look good- she is stable & exercising;  We plan ROV recheck in 32mo sooner for any problems   HBP>  Controlled on Bush, tol well, asymptomatic, continue same...  Cerebrovasc Dis & hx TIA>  She remains asymptomatic w/o cerebral ischemic symptoms, continue Agrennox...  HYPERLIPID>  Stable on diet + Simva20...  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  GI> Hx polyps>  She is up to date and had f/u colonoscopy 12/12 as above... 1/17>  Try Bentyl for IBS like symptoms...  Osteopenia>  Managed by GYN DrRichardson & pt reports that her last BMD was "excellent"...  Skin Cancer>  She reports Moh's surg for BCE on nose, plus several "keratoses"; new lesion inside left pinna w/ surg pending...  Other medical issues as noted...   Patient's Medications  New Prescriptions   No medications on file  Previous Medications   CALCIUM CARBONATE-VITAMIN D (  CALCIUM 600+D) 600-400 MG-UNIT PER TABLET    Take 1 tablet by mouth daily.    CETIRIZINE (ZYRTEC) 5 MG TABLET    Take 5 mg by mouth as needed.     CHOLECALCIFEROL (VITAMIN D) 1000 UNITS TABLET    Take 1,000 Units by mouth daily.   DEXTROMETHORPHAN-GUAIFENESIN (MUCINEX DM) 30-600 MG PER 12 HR TABLET    Take 1 tablet by mouth as needed.    DICYCLOMINE (BENTYL) 20 MG TABLET    Take 1 tablet (20 mg total) by mouth 2 (two) times daily as needed (abdominal Bush).   LACTOBACILLUS (PROBIOTIC ACIDOPHILUS PO)    Take by mouth daily. Reported on 12/03/2015   LEVOTHYROXINE (SYNTHROID, LEVOTHROID) 100 MCG TABLET    Take 100 mcg by mouth daily.     MULTIPLE VITAMINS-MINERALS (CENTRUM SILVER PO)    Take 1 tablet by mouth daily.     TRAMADOL (ULTRAM) 50 MG TABLET    Take 1 tablet (50 mg total) by mouth 3 (three) times daily as needed.   VITAMIN E 400 UNIT CAPSULE    Take 400 Units by mouth daily.  Modified Medications   Modified Medication Previous Medication    AMLODIPINE (NORVASC) 10 MG TABLET amLODipine (NORVASC) 10 MG tablet      Take 1 tablet (10 mg total) by mouth daily.    Take 1 tablet (10 mg total) by mouth daily.   DIPYRIDAMOLE-ASPIRIN (AGGRENOX) 200-25 MG 12HR CAPSULE dipyridamole-aspirin (AGGRENOX) 200-25 MG 12hr capsule      Take 1 capsule by mouth 2 (two) times daily.    Take 1 capsule by mouth 2 (two) times daily.   FUROSEMIDE (LASIX) 20 MG TABLET furosemide (LASIX) 20 MG tablet      Take 1/2 daily as needed for swelling    Take 1/2 daily as needed for swelling   METOPROLOL SUCCINATE (TOPROL-XL) 50 MG 24 HR TABLET metoprolol succinate (TOPROL-XL) 50 MG 24 hr tablet      TAKE 1 TABLET BY MOUTH EVERY DAY TAKE WITH OR IMMEDIATELY FOLLOWING A MEAL    TAKE 1 TABLET BY MOUTH EVERY DAY TAKE WITH OR IMMEDIATELY FOLLOWING A MEAL   SIMVASTATIN (ZOCOR) 20 MG TABLET simvastatin (ZOCOR) 20 MG tablet      Take 1 tablet (20 mg total) by mouth at bedtime.    Take 1 tablet (20 mg total) by mouth at bedtime.  Discontinued Medications   No medications on file

## 2017-12-06 NOTE — Patient Instructions (Signed)
Today we updated your med list in our EPIC system...    Continue your current medications the same...    We refilled your meds per request...  Today we did a follow up CXR & FASTING blood work...    We will contact you w/ the results when available...   Keep up the good work on your diet & exercise program...  Call for any questions...  Let's plan a follow up visit in 75mo, sooner if needed for problems.Marland KitchenMarland Kitchen

## 2018-01-25 DIAGNOSIS — D1801 Hemangioma of skin and subcutaneous tissue: Secondary | ICD-10-CM | POA: Diagnosis not present

## 2018-01-25 DIAGNOSIS — C44311 Basal cell carcinoma of skin of nose: Secondary | ICD-10-CM | POA: Diagnosis not present

## 2018-01-25 DIAGNOSIS — D225 Melanocytic nevi of trunk: Secondary | ICD-10-CM | POA: Diagnosis not present

## 2018-01-25 DIAGNOSIS — Z85828 Personal history of other malignant neoplasm of skin: Secondary | ICD-10-CM | POA: Diagnosis not present

## 2018-01-25 DIAGNOSIS — L820 Inflamed seborrheic keratosis: Secondary | ICD-10-CM | POA: Diagnosis not present

## 2018-01-25 DIAGNOSIS — C44319 Basal cell carcinoma of skin of other parts of face: Secondary | ICD-10-CM | POA: Diagnosis not present

## 2018-02-03 ENCOUNTER — Other Ambulatory Visit: Payer: Self-pay | Admitting: Pulmonary Disease

## 2018-02-17 ENCOUNTER — Other Ambulatory Visit: Payer: Self-pay | Admitting: Pulmonary Disease

## 2018-02-21 DIAGNOSIS — M8589 Other specified disorders of bone density and structure, multiple sites: Secondary | ICD-10-CM | POA: Diagnosis not present

## 2018-02-21 DIAGNOSIS — Z1231 Encounter for screening mammogram for malignant neoplasm of breast: Secondary | ICD-10-CM | POA: Diagnosis not present

## 2018-03-10 DIAGNOSIS — Z961 Presence of intraocular lens: Secondary | ICD-10-CM | POA: Diagnosis not present

## 2018-03-22 DIAGNOSIS — C44311 Basal cell carcinoma of skin of nose: Secondary | ICD-10-CM | POA: Diagnosis not present

## 2018-04-05 DIAGNOSIS — C44319 Basal cell carcinoma of skin of other parts of face: Secondary | ICD-10-CM | POA: Diagnosis not present

## 2018-06-06 ENCOUNTER — Ambulatory Visit: Payer: Medicare Other | Admitting: Pulmonary Disease

## 2018-06-09 ENCOUNTER — Ambulatory Visit (INDEPENDENT_AMBULATORY_CARE_PROVIDER_SITE_OTHER): Payer: Medicare Other | Admitting: Pulmonary Disease

## 2018-06-09 ENCOUNTER — Encounter: Payer: Self-pay | Admitting: Pulmonary Disease

## 2018-06-09 VITALS — BP 110/64 | HR 69 | Temp 97.6°F | Ht 61.0 in | Wt 124.6 lb

## 2018-06-09 DIAGNOSIS — C73 Malignant neoplasm of thyroid gland: Secondary | ICD-10-CM

## 2018-06-09 DIAGNOSIS — M949 Disorder of cartilage, unspecified: Secondary | ICD-10-CM

## 2018-06-09 DIAGNOSIS — I679 Cerebrovascular disease, unspecified: Secondary | ICD-10-CM

## 2018-06-09 DIAGNOSIS — E782 Mixed hyperlipidemia: Secondary | ICD-10-CM | POA: Diagnosis not present

## 2018-06-09 DIAGNOSIS — I10 Essential (primary) hypertension: Secondary | ICD-10-CM

## 2018-06-09 DIAGNOSIS — M899 Disorder of bone, unspecified: Secondary | ICD-10-CM | POA: Diagnosis not present

## 2018-06-09 DIAGNOSIS — G459 Transient cerebral ischemic attack, unspecified: Secondary | ICD-10-CM | POA: Diagnosis not present

## 2018-06-09 DIAGNOSIS — E032 Hypothyroidism due to medicaments and other exogenous substances: Secondary | ICD-10-CM

## 2018-06-09 DIAGNOSIS — D126 Benign neoplasm of colon, unspecified: Secondary | ICD-10-CM

## 2018-06-09 MED ORDER — AMLODIPINE BESYLATE 10 MG PO TABS: 10.0000 mg | ORAL_TABLET | Freq: Every day | ORAL | 2 refills | Status: DC

## 2018-06-09 MED ORDER — DICYCLOMINE HCL 20 MG PO TABS: 20.0000 mg | ORAL_TABLET | Freq: Two times a day (BID) | ORAL | 5 refills | Status: DC | PRN

## 2018-06-09 MED ORDER — METOPROLOL SUCCINATE ER 50 MG PO TB24: ORAL_TABLET | ORAL | 3 refills | Status: DC

## 2018-06-09 MED ORDER — ASPIRIN-DIPYRIDAMOLE ER 25-200 MG PO CP12: 1.0000 | ORAL_CAPSULE | Freq: Two times a day (BID) | ORAL | 3 refills | Status: DC

## 2018-06-09 MED ORDER — SIMVASTATIN 20 MG PO TABS: 20.0000 mg | ORAL_TABLET | Freq: Every day | ORAL | 3 refills | Status: DC

## 2018-06-09 MED ORDER — FUROSEMIDE 20 MG PO TABS: ORAL_TABLET | ORAL | 5 refills | Status: DC

## 2018-06-09 NOTE — Patient Instructions (Signed)
Today we updated your med list in our EPIC system...    Continue your current medications the same...  We refilled your meds per request...  We also discussed checking a follow up Carotid Doppler exam and a 2DEchocardiogram to check your valves...    We will contact you w/ the results when available...   Stay as active as possible...    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  Call for any questions or if I can by of service in any way...    It has been my great pleasure to have been your doctor over all these years!!!

## 2018-06-10 ENCOUNTER — Encounter: Payer: Self-pay | Admitting: Pulmonary Disease

## 2018-06-10 NOTE — Progress Notes (Signed)
Subjective:    Patient ID: [name], female    DOB: Sep 03, 1941, 77 y.o.   MRN: [id number]  HPI 77 y/o WF here for a follow up visit... she has mult med problems as noted below> ~  SEE PREV EPIC NOTES FOR OLDER DATA >>     CXR 1/14 showed norm heart size, clear lungs-wnl, clips in lower neck from thyroid surg...  LABS 1/14: FLP- at goals on Simva20;  Chems- wnl;  CBC- wnl;  VitD= 69;  Thyroid per DrKerr...  LABS 1/15:  FLP- at goals on Simva20;  Chems- wnl;  CBC- wnl;  TSH=0.42 on Levo100...    ~  November 28, 2014:  17moROV & LLakelytells me she had left eye surg 10/15 by DrMSpencer (?pterigium)) w/ post op hemorrhage prob due to Aggrenox rx;  She also had an URI 12/15 & called in- given Augmentin & Pred=> symptoms resolved... We reviewed the following medical problems during today's office visit >>     AR> on Zyrtek, Mucinex; stable on prn med rx...    HBP, MVP> on Metop50, Amlod10, Lasix20-1/2daily prn; BP= 128/78 & she denies CP, palpit, dizzy, SOB, edema, etc...    Cerebrovasc dis & TIA> on Aggrenox25-200Bid; she remains asymptomatic w/o cerebral ischemic symptoms...    CHOL> on Simva20; FLP 1/16 shows TChol 173, TG 143, HDL 51, LDL 93; continue same Rx & diet efforts...    Thyroid cancer & hypothy> on Synthroid100; followed by DrKerr Q685moseen 6/15 & doing well...    GI- colon polyps> followed by DrPerry w/ last colon 12/12 showing 2 sm adenomatous polyps removed & f/u planned 5y75yr.    DJD right shoulder & LBP> off Vicodin & using Tramadol50 prn; she had eval DrDuda (given 2 shots in shoulder 2015 & improved) & [facility]; doing well w/o signif recurrent pain...    Osteopenia> on calcium, MVI, VitD; prev on Fosamax but stopped by GYN ?2008 & they follow her BMDs...    Skin Cancer> new- in left ext ear canal w/ surg planned by Derm 1/14...    Shingles> she had the shingles vaccine prev; developed rt leg rash/pain & eval by Ortho- DrHilts 12/13- treated w/  Valtrex, Pred, Vicodin & resolved... We reviewed prob list, meds, xrays and labs> see below for updates >> she had the 2015 flu vaccine 9/15 7 is up to date on all vaccinations...   CXR 1/16 showed norm heart size, calcif in Ao, clear lungs, NAD...   LABS 1/16:  FLP- at goals on Simva20;  Chems- wnl;  CBC- wnl;  TSH=0.40 on synth100;  VitD=51 on 1000u daily...   ~  May 29, 2015:  28mo58mo & [name] but has mult somatic complaints- notes another epidural steroid shot 5/16 by DrNewton after failing Pred (she states intol "it tore up nmy stomach"); under stress since her brother had a stroke;  She has some dysuria & wants a urinalysis thinking that the Pred gave her a UTI...    BP stable on Amlod10, MetopER50, Lasix20-1/2 prn; BP=138/72 & she denies HAs, CP, palpit, SOB, edema...     She remains on Aggrenox Bid w/o cerebral ischemic symptoms, stable...    Lipids have been at goal on diet + Simva20...    Thyroid stable on Synthroid100...    She takes calcium, Vits, probiotic, Tramadol prn... EXAM shows Afeb, VSS, O2sat=100% on RA;  HEENT- neg, mallampati2;  Chest- clear w/o w/r/r;  Heart- RR w/o m/r/g;  AQbd- soft, nontender;  Ext- neg w/o c/c/e;  Neuro- neg, intact... IMP/PLAN>>  [name] is stable on current meds- refilled per request; she already had the 2016 Flu vaccine & up to date; ROV 70mo..  ~  December 03, 2015:  650moOScotiaeports that she had a skin cancer removed from under her left eye at the skin cancer center DrAlbertini;  She has mult somatic complaints including left ear discomfort, & lower abd pain x several days- we discussed IBS & Rx trial w/ Bentyl vs referral to GI- DrPerry    AR> on Zyrtek, Mucinex; stable on prn med rx...    HBP, MVP> on Metop50, Amlod10, Lasix20-1/2daily prn; BP= 138/72 & she denies CP, palpit, dizzy, SOB, edema, etc...    Cerebrovasc dis & TIA> on Aggrenox25-200Bid; she remains asymptomatic w/o cerebral ischemic symptoms...    CHOL> on Simva20;  FLP 1/17 shows TChol 168, TG 128, HDL 58, LDL 84; continue same Rx & diet efforts...    Thyroid cancer & hypothy> s/p thyroid surg, on Synthroid100; followed by DrKerr Q6m77modoing well; Labs here 1/17 showed TSH= 0.37    GI- colon polyps> followed by DrPerry w/ last colon 12/12 showing 2 sm adenomatous polyps removed & f/u planned 11yr68yr/o IBS like symptoms 1/17- trial Bentyl20 tid prn...    DJD right shoulder & LBP> off Vicodin & using Tramadol50 prn; she had eval DrDuda (given 2 shots in shoulder 2015 & improved) [facility]; doing well w/o signif recurrent pain...    Osteopenia> on calcium, MVI, VitD; prev on Fosamax but stopped by GYN ?2008 & they follow her BMDs...    Skin Cancer> new- in left ext ear canal w/ surg by DermPayton Mccallum4...    Shingles> she had the shingles vaccine prev; developed rt leg rash/pain & eval by Ortho- DrHilts 12/13- treated w/ Valtrex, Pred, Vicodin & resolved... EXAM shows Afeb, VSS, O2sat=98% on RA;  HEENT- neg x cerumen, mallampati2;  Chest- clear w/o w/r/r;  Heart- RR w/o m/r/g;  AQbd- soft, nontender;  Ext- neg w/o c/c/e;  Neuro- neg, intact...  CXR 12/03/15> norm heart size, aortic calcif is noted, clear lungs/ NAD...  Marland KitchenMarland KitchenKG 12/03/15>  NSR, rate64, wnl/ NAD....  LABS 12/03/15>  FLP- at goals on simva20- continue same;  Chems- wnl on diet alone;  CBC- wnl;  TSH=0.37 on Synthroid100 per drKerr;  VitD level = 56...  IMP/PLAN>>  [name]- we'll try Bentyl for abd cramping & f/u w/ DrPerry if not responding;  Also needs ENT for cerumen impactions; due to Gyn check w/ DrRichardson...  ~  June 01, 2016:  17mo 108mo&Renner Cornerrts that the Bentyl prescribed last OV really helped her IBS cramping & she continues using it Bid prn;  She reports otherw doing satis- no new complaints or concerns...     She remains on Aggrenox Bid, MetopER50, Amlod10, Lasiz20 prn; BP=126/74 & she denies CP, palpit, SOB, edema, and no cerebral ischemic symptoms.    FLP looks  good on Simva20 & diet...    She remains on Synthroid100 per DrKerr & is clinically & biochem euthyroid...  EXAM shows Afeb, VSS, O2sat=98% on RA;  HEENT- neg x cerumen, mallampati2;  Chest- clear w/o w/r/r;  Heart- RR w/o m/r/g;  AQbd- soft, nontender;  Ext- neg w/o c/c/e;  Neuro- neg, intact... IMP/PLAN>>  [name], we reflled meds per request, continue same + diet/ exercise...   ~  December 03, 2016:  72moROV & Asher reports a stable interval- no new complaints or concerns;  She's had Bilat cataract [id number] DrShapiro & doing satis;  She saw GYN- DrRichardson 09/2016 & stable- Mammogram was neg w/ dense breasts and BMD (last 01/2016) showed lowest Tscore = -2.2 [facility]... We reviewed the following medical problems during today's office visit >>     AR> on Zyrtek, Mucinex; stable on prn med rx...    HBP, MVP> on Metop50, Amlod10, Lasix20-1/2daily prn; BP= 118/64 & she denies CP, palpit, dizzy, SOB, edema, etc...    Cerebrovasc dis & TIA> on Aggrenox25-200Bid; she remains asymptomatic w/o cerebral ischemic symptoms...    CHOL> on Simva20; FLP 1/18 shows TChol 165, TG 109, HDL 51, LDL 92; continue same Rx & diet efforts...    Thyroid cancer & hypothy> s/p thyroid surg, on Synthroid100; followed by DrKerr Q655mo doing well; Labs here 1/18 showed TSH= 0.48    GI- colon polyps> followed by DrPerry w/ last colon 12/12 showing 2 sm adenomatous polyps removed & f/u planned 5y33yrc/o IBS like symptoms 1/17- trial Bentyl20 tid prn; she will call GI for f/u colon......Marland KitchenMarland KitchenMarland Kitchen DJD right shoulder & LBP> off Vicodin & using Tramadol50 prn; she had eval DrDuda (given 2 shots in shoulder 2015 & improved) [facility]; doing well w/o signif recurrent pain (wears back brace when working    Osteopenia> on calcium, MVI, VitD; prev on Fosamax but stopped by GYN ?2008 & they follow her BMDs=> last 3/17 showing lowest Tscore -2.2 in left FemNeck on Calcium, MVI, VitD,  wt bearing exercise (consider Prolia)......    Skin Cancer> new- in left ext ear canal w/ surg by DerPayton Mccallum14...    Shingles> she had the shingles vaccine prev; developed rt leg rash/pain & eval by Ortho- DrHilts 12/13- treated w/ Valtrex, Pred, Vicodin & resolved... EXAM shows Afeb, VSS, O2sat=99% on RA;  HEENT- neg x cerumen, mallampati2;  Chest- clear w/o w/r/r;  Heart- RR Gr1/6 SEM w/o r/g;  Abd- soft, nontender;  Ext- neg w/o c/c/e;  Neuro- neg, intact...  LABS 12/03/16:  FLP- all parameters at goals on Simva20;  Chems- wnl;  CBC- wnl;  TSH=0.48;  VitD=51;   IMP/PLAN>>  [name] remains stable overall, rec to continue same meds (refilled today per request), she is due for a colonoscopy w/ DrPerry & we will refer; she knows to call for any problems otherw follow up in ~72mo18mo  ~  June 02, 2017:  72mo 372mo& [name] doing well but noticed a sharp pain in the center of her chest today, resolved spont, no pain prev or since & not assoc w/ paplit, dizziness, syncope, SOB, edema, etc...    She had Ophthal check from DrShapiro 03/01/17> s/p cat surg w/ improved vision, some dryness, doing satis post op & asked to ret 15yr..9yr She had Colonoscopy 04/12/17 by DrPerry>  Tiny 1mm po88m removed, int hems noted, otherw neg... Polyp was a tubular adenoma & rec for f/u colon in 7yrs...65yr She saw ENDO-DrKerr 04/21/17>  Post surg hypothyroid, hx papillary adenoCa of thyroid, hx low bone mass (managed by GYN), fam hx DM w/ abn BS levels;  Labs showed BS=100, on Bisphos drug holiday, TSH=0.40, thyroglob antibody NEG...  We reviewed the following medical problems during today's office visit >>     HBP, MVP, cerebrovasc dis & TIA> on  Metop50, Amlod10, Lasix20-1/2daily prn, + Aggrenox25-200Bid; BP=110/70 & she denies CP, palpit, dizzy, weakness, etc...    Chol ok on Simva20 & Sugars ok on diet alone- followed by DrKerr...    Hx thyroid ca & on Synthroid100 w/TSH=0.40 by DrKerr recently...    Low bone mass followed  & managed by GYN- on bisphos drug holiday at this time...  EXAM shows Afeb, VSS, O2sat=98% on RA;  HEENT- neg x cerumen, mallampati2;  Chest- clear w/o w/r/r;  Heart- RR Gr1/6 SEM w/o r/g;  Abd- soft, nontender;  Ext- neg w/o c/c/e;  Neuro- neg, intact... IMP/PLAN>>  Trinady is stable- continue current rx & we plan ROV recheck in 12mow/ CPX- labs, CXR, EKG, 2DEcho...  ~  December 06, 2017:  675moOV & [name] has had some stress recently- her brother died last month (hx heart dis, CHF, strokes, dementia- and she developed lightheadedness w/ syncopal spell "everything went black" she says but she did not lose consciousness she says & it lasted a few seconds;  She wonders if she needs MRI brain, CDopplers and I offered to schedule these tests for her but she has decided to wait;  She has no focal neuro symptoms and denies HA, confusion, weakness, numbness, tingling, etc... otherw she reports feeling OK- no CP, palpit, edema; no cough, phlegm, dyspnea; no f/c/s etc...  We reviewed the following interval medical visits since Aug2018>      She saw ENDO- DrKerr on 07/23/17> f/u for post surg hypothyroid, hx papillary adenoCa of thyroid, low bone mass, +fam hx DM; Labs showed TSH=0.87 and Thyroglobulin level was <0.1; Chems are wnl w/ BS=100, Cr=0.63, LFTs wnl...  We reviewed the following medical problems during today's office visit>      AR> on Zyrtek, Mucinex; stable on prn med rx...    HBP, MVP> on Metop50, Amlod10, Lasix20-1/2daily prn; BP= 110/70 & she denies CP, palpit, dizzy, SOB, edema, etc...    Cerebrovasc dis & TIA> on Aggrenox25-200Bid; she remains asymptomatic w/o cerebral ischemic symptoms; she had lightheaded episode w/ ?syncope 11/2017 during period of incr stress w/ passing of her brother, no residual symotoms, no focal symptoms, & no recurrence...    CHOL> on Simva20; FLLe Mars/19 shows TChol 159, TG 91, HDL 64, LDL 77; continue same Rx & diet efforts...    Thyroid cancer & hypothy> s/p thyroid surg,  on Synthroid100; followed by DrKerr Q6m47modoing well; Labs followed by endocrine w/ euthyroid status on replacement hormone and undetectable thyroglobulin levels...    GI- colon polyps> followed by DrPerry w/ last colon 6/18 showing 1 tiny adenomatous polyps removed & f/u planned 94yr41yr/o IBS like symptoms 1/17- trial Bentyl20 tid prn.     DJD right shoulder & LBP> off Vicodin & using Tramadol50 prn; she had eval DrDuda (given 2 shots in shoulder 2015 & improved) [facility]; doing well w/o signif recurrent pain (wears back brace when working    Osteopenia> on calcium, MVI, VitD; prev on Fosamax but stopped by GYN ?2008 & they follow her BMDs=> last 3/17 showing lowest Tscore -2.2 in left FemNeck on Calcium, MVI, VitD, wt bearing exercise (consider Prolia)...    Skin Cancer> new- in left ext ear canal w/ surg by DermPayton Mccallum4...    Shingles> she had the shingles vaccine prev; developed rt leg rash/pain & eval by Ortho- DrHilts 12/13- treated w/ Valtrex, Pred, Vicodin & resolved... EXAM shows Afeb, VSS, O2sat=97% on RA;  HEENT- neg x cerumen, mallampati2;  Chest- clear w/o w/r/r;  Heart- RR Gr1/6 SEM w/o r/g;  Abd- soft, nontender;  Ext- neg w/o c/c/e;  Neuro- neg, intact...  CXR 12/06/17>  Norm heart size, atherosclerosis in Ao, clear lungs- NAD...  LABS 12/06/17>  FLP- all parameters at goals on Simva20;  Chems- wnl;  CBC- wnl;  TSH=0.43;  VitD=52 IMP/PLAN>>  Monaye had a scare w/ the stress of her brother's passing, and ?brief syncopal spell;  No recurrent problems so far- we discussed checking MRI & CDopplers but she wants to wait since she has not had any recurrent symptoms so far;  CXR & labs look good- she is stable & exercising;  We plan ROV recheck in 42mo sooner for any problems...   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  We reviewed the following medical problems during today's office visit>      AR> on Zyrtek, Mucinex; stable on prn med rx...    HBP> on Metop50, Amlod10, Lasix20-1/2daily prn; BP= 110/70 & she denies CP, palpit, dizzy, SOB, edema, etc...    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    Cerebrovasc dis & TIA> on Aggrenox25-200Bid; she remains asymptomatic w/o cerebral ischemic symptoms; she had lightheaded episode w/ ?syncope 11/2017 during period of incr stress w/ passing of her brother, no residual symotoms, no focal symptoms, & no recurrence;  F/u CDopplers 06/2018 showed bilat 1-39% stenoses & improved from last CDoppler study in 2016; antegrade vertebrals and normal hemodynamics in subclavians    CHOL> on Simva20; FLP 2/19 shows TChol 159, TG 91, HDL 64, LDL 77; continue same Rx & diet efforts...    Thyroid cancer & hypothy> s/p thyroid surg, on Synthroid100; followed by DrKerr Q6mo36mooing well; Labs followed by Endocrine w/ euthyroid status on replacement hormone and undetectable thyroglobulin levels...    GI- colon polyps> followed by DrPerry w/ last colon 6/18 showing 1 tiny adenomatous polyps removed & f/u planned 72yrs83yro IBS-like symptoms 1/17- trial Bentyl20 tid prn.     DJD right shoulder & LBP> off Vicodin & using Tramadol50 prn; she had eval DrDuda (given 2 shots in shoulder 2015 & improved) & [facility]; doing well w/o signif recurrent pain (wears back brace when working...    Osteopenia> on calcium, MVI, VitD; prev on Fosamax but stopped by GYN ?2008 & they follow her BMDs=> last 3/17 showing lowest Tscore -2.2 in left FemNeck on Calcium, MVI, VitD, wt  bearing exercise (consider Prolia)...    Skin Cancer> s/p left ext ear canal surg by Derm 1/14 & Basal cell removed from nose by DrAlbertini in 2019...    Shingles> she had the shingles vaccine prev; developed rt leg rash/pain & eval by Ortho- DrHilts 12/13- treated w/ Valtrex, Pred, Vicodin & resolved... EXAM shows Afeb, VSS, O2sat=97% on RA;  HEENT- neg, mallampati2;  Chest- clear w/o w/r/r;  Heart- RR Gr1/6 SEM w/o r/g;  Abd- soft, nontender;  Ext-  neg w/o c/c/e;  Neuro- neg, intact...  CDopplers 06/13/18>  bilat 1-39% stenoses & improved from last CDoppler study in 2016; antegrade vertebrals and normal hemodynamics in subclavians  2DEcho 06/14/18>  Norm LVF w/ EF=60-65% and no RWMA, Gr1DD, AoV mobility restricted w/ mild AS/AI (mean gradient 12, peak=22), mildly thickened MV leaflets w/ mild MR, mild LA dil at 42m and mild pulmHTN w/ PAsys=35 mmHg... We will set her up to see cards for the valvular heart dis...  IMP/PLAN>>  LRowenais stable overall;  New finding is 2DEcho w/ mild AS/AI and MR- we will refer to CARDS for their eval & to establish follow up;  Otherw she is stable- still sees drKerr regularly for f/u thyroid cancer & iatrogenic hypothyroidism;  Continue current med regimen the same;  We discussed new PCPs needed starting 2020...          Problem List:  ALLERGIC RHINITIS (ICD-477.9) - we discussed Rx w/ Zyrtek, Saline, Mucinex OTC...  HYPERTENSION (ICD-401.9) - on METOPROLOL XL '50mg'$ /d, NORVASC '10mg'$ /d, & LASIX '20mg'$ - 1/2 tab daily...   ~  CXR 12/10 showed mild cardiomeg, clear lungs, NAD... ~  10/11:  BP=134/86, doing well w/ med + home BP checks... denies HA, fatigue, visual changes, CP, palipit, dizziness, syncope, dyspnea, edema, etc... ~  4/12:  BP= 120/76 & she continues to remain asymptomatic... ~  11/12:  BP= 122/72 & she continues stable w/o CP, palpit, dizzy, SOB, edema, or cerebral ischemic symptoms on her Aggrenox Bid. ~  5/13:  BP= 138/72 & she denies CP, palpit, SOB,  edema, etc... ~  1/14: on Metop50, Amlod10, Lasix20-1/2daily; BP= 138/76 & she denies CP, palpit, dizzy, SOB, edema, etc... ~  CXR 1/14 showed normal heart size, clear lungs, surg clips in low neck, NAD..[name] ~  7/14: BP is controlled on MetopER50-1/2 tab, Amlod10, and Lasix20-1/2tab daily; BP= 130/70 & she denies HA, dizzy, CP, palpit, SOB, edema, etc. ~  1/15: BP is regulated w/ MetopER50, Amlod10, Lasix20-1/2Qam; BP= 110/68 & she denies CP, palpit, SOB, edema, etc. ~  7/15: BP well controlled on Metop50, Amlod10, Lasix20-1/2 daily; BP= 130/70 & she denies CP, palpit, SOB, edema, or cerebral ischemic symptoms. ~  1/16: on Metop50, Amlod10, Lasix20-1/2daily prn; BP= 128/78 & she denies CP, palpit, dizzy, SOB, edema, etc. ~  1/17:  BP remains under good control...  VALVULAR HEART DISEASE - clinical diagnosis of MVP in the past... no recent symptoms of CP or palpit... can't find OLD 2DEcho... ~  2DEcho 06/14/18>  Norm LVF w/ EF=60-65% and no RWMA, Gr1DD, AoV mobility restricted w/ mild AS/AI (mean gradient 12, peak=22), mildly thickened MV leaflets w/ mild MR, mild LA dil at 39mand mild pulmHTN w/ PAsys=35 mmHg... We will set her up to see cards for the valvular heart dis...   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 CDoppler's w/ non-obstructive plaque right carotid bulb, no signif ICA stenoses... ~  f/u CDoppler 5/10 showed mild plaque bilat, 0-39% blat ICA stenoses... f/u 1y855yr  f/u CDoppler 5/11 showed mild plaque bilat, 0-39% bilat ICA stenoses, no change, f/u 55yr48yr f/u CDoppler 4/12 showed mild plaque in bulbs, 0-39% bilat ICA stenoses, stable, repeat rec in 91yrs42yr f/u CDopplers 4/14 showed mild heterogeneous plaque bilat, stable 0-39% bilat ICA stenoses, f/u 2 yrs... ~  f/u CDopplers 5/16 showed heterogeneous plaque bilat, progression of the right stenosis to 40-5977-82%e, stable LICA at 0-39%4-23%  2yr.. ~  f/u CDopplers 8/19 showed bilat 1-39% stenoses & improved from last CDoppler study in 2016; antegrade vertebrals and normal hemodynamics in subclavians  HYPERLIPIDEMIA (ICD-272.4) - on SIMVASTATIN '20mg'$ /d... she tried off meds 3/09 due to leg pains- but further eval showed HNP...  ~  pre-treatment FLP 3/04 showed TChol 282, TG 210, HDL 56, LDL 184... statin Rx started... ~  f/u FLP's on Statin Rx were good:  TChol 150-166, TG 87-119, HDL 35-49, LDL 86-107... ~  [place] on Zocor20/d showed TChol 174, TG 136, HDL 47, LDL 99... ~  F[place]4/09 off Zocor showed TChol 285, TG 192, HDL 56, LDL 200... rec- restart Zocor20. ~  [place]/09 on Simva20 showed TChol 189, Tg 142, HDL 46, LDL 115... rec> continue same. ~  FLP 4/10 on Simva20 showed TChol 193, TG 114, HDL 55, LDL 116... may need incr dose. 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  ~  Endocrine eval by DrKerr 9/10 w/ radioactive iodine therapy- 106 mCi I- 131 given and scan showed only uptake in neck... ~  labs 4/10 showed TSH = 1.30 ~  9/10:  started on thyroid replacement by DrKerr- currently Synthroid 112 micrograms/d. ~   12/10: Synthroid decreased to 10056md due to ?elevated BP?... labs followed by DrKerr. ~  9/11:  f/u DrKerr doing well- labs on SYNTHROID 100m91m= OK, and I-131 whole body scan reported neg. ~  4/12:  She continues regular f/u DrKerr> TSH= 0.36, Thyroid Ultrasound w/o recurrent or resid thyroid tissue seen... ~  4/13:  F/u DrKerr 4/13 w/ hx papillary thyroid cancer> s/p thyroidectomy & RAI therapy; on Levothy100mc33m ThySonar 4/13> no resid thyroid tissue or mass; "Everything was fine, no sign of cancer coming back" she says. ~  11/13: she saw DrKerr for 4mo R13mopapillary thyr ca, follicular variant- post surg hypothyroidism on Synthroid100; TSH=0.52; Throglob level remains undetectable... ~  Thyroid Ultrasound 6/14 showed no resid thyroid tissue & no adenopathy... ~  She continues regular Q4mo fo90mo up visits w/ DrKerr...  COLONIC POLYPS (ICD-211.3) - colonoscopy 8/09 by DrPerry w/ several adenomatous polyps removed... f/u planned 5 yrs. ~  She saw DrPerry 12/12 w/ colonoscopy done> 2 polyps in asc colon= tubular adenomas & f/u suggested in 13yrs...104yrCK PAIN, LUMBAR (ICD-724.2) - eval DrDuda 4/09 w/ HNP L5-S1 and treated w/ epid steroid shot by DrNewton and improved...  OSTEOPENIA (ICD-733.90) - prev on Fosamax but this [facility]... she takes Caltrate,  Vits, Vit D... ~  labs 4/09 showed Vit D level = 33 therefore started Vit D OTC 11-1998 daly. ~  labs 4/10 showed Vit D level = 59 on 1000 u daily. ~  pt reports that f/u BMD 9/10 was "excellent"... ~  labs 4/11 showed Vit d level = 39... rec> continue 1000 u daily. 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 stable on AGGRENOX Bid... ~  4/12:  She continues  on Aggrenox bid w/o cerebral ischemic symptoms...  Skin Cancer - BCE removed from left ankle by DrNolan 8/10... Another removed from nose & followed by Moh's surg... ~  She has had several Basal cells removed w/ subseq Moh's surg at the Skin cancer center...  Hx Shingles >> she's had the Shingles vaccine... ~  5/13:  She reports sudden right hip pain w/ eval DrHilts- Shingles Rx w/ Valtrex & Vicodin by him... ~  12/13: she had an outbreak on right leg w/ pain; went to her Ortho- DrHilts & treated w/ Valtrex, Pred, Vicodin and resolved...  Health Maintenance:   ~  last colonoscopy 8/09 by DrPerry> see above. ~  GYN= DrRichardson... pt states she is seen every other yr & Mammogram/ BMD at [facility]. ~  Immunizations:  Tetanus/ TDAP updated 2010;  Pneumovax in 2006 at age 43;  Given Prevnar-13 7/15;  she gets the Flu shots yearly...   Past Surgical History:  Procedure Laterality Date  . basal cell carcinoma removed from inside the left ear  11/04/2012   june 2015  . COLONOSCOPY    . DILATION AND CURETTAGE OF UTERUS    . EYE SURGERY  08/22/2014  . POLYPECTOMY    . TOTAL THYROIDECTOMY      Outpatient Encounter Medications as of 06/09/2018  Medication Sig  . amLODipine (NORVASC) 10 MG tablet Take 1 tablet (10 mg total) by mouth daily.  . Calcium Carbonate-Vitamin D (CALCIUM 600+D) 600-400 MG-UNIT per tablet Take 1 tablet by mouth daily.   . cetirizine (ZYRTEC) 5 MG tablet Take 5 mg by mouth as needed.    . cholecalciferol (VITAMIN D) 1000 UNITS tablet Take 1,000 Units by mouth daily.  [name] dextromethorphan-guaiFENesin (MUCINEX DM) 30-600 MG per 12 hr tablet Take 1 tablet by mouth as needed.   . dicyclomine (BENTYL) 20 MG tablet Take 1 tablet (20 mg total) by mouth 2 (two) times daily as needed (abdominal cramping).  [name] dipyridamole-aspirin (AGGRENOX) 200-25 MG 12hr capsule Take 1 capsule by mouth 2 (two) times daily.  . furosemide (LASIX) 20 MG tablet Take 1/2 daily as needed for swelling  .  Lactobacillus (PROBIOTIC ACIDOPHILUS PO) Take by mouth daily. Reported on 12/03/2015  . levothyroxine (SYNTHROID, LEVOTHROID) 100 MCG tablet Take 100 mcg by mouth daily.    . metoprolol succinate (TOPROL-XL) 50 MG 24 hr tablet TAKE 1 TABLET BY MOUTH EVERY DAY TAKE WITH OR IMMEDIATELY FOLLOWING A MEAL  . Multiple Vitamins-Minerals (CENTRUM SILVER PO) Take 1 tablet by mouth daily.    . simvastatin (ZOCOR) 20 MG tablet Take 1 tablet (20 mg total) by mouth at bedtime.  . traMADol (ULTRAM) 50 MG tablet Take 1 tablet (50 mg total) by mouth 3 (three) times daily as needed.  . vitamin E 400 UNIT capsule  Take 400 Units by mouth daily.  . [DISCONTINUED] amLODipine (NORVASC) 10 MG tablet TAKE ONE TABLET BY MOUTH DAILY  . [DISCONTINUED] dicyclomine (BENTYL) 20 MG tablet Take 1 tablet (20 mg total) by mouth 2 (two) times daily as needed (abdominal cramping).  . [DISCONTINUED] dipyridamole-aspirin (AGGRENOX) 200-25 MG 12hr capsule Take 1 capsule by mouth 2 (two) times daily.  . [DISCONTINUED] furosemide (LASIX) 20 MG tablet Take 1/2 daily as needed for swelling  . [DISCONTINUED] metoprolol succinate (TOPROL-XL) 50 MG 24 hr tablet TAKE 1 TABLET BY MOUTH EVERY DAY TAKE WITH OR IMMEDIATELY FOLLOWING A MEAL  . [DISCONTINUED] simvastatin (ZOCOR) 20 MG tablet Take 1 tablet (20 mg total) by mouth at bedtime.   No facility-administered encounter medications on file as of 06/09/2018.     Allergies  Allergen Reactions  . Sulfonamide Derivatives     REACTION: rash    Immunization History  Administered Date(s) Administered  . H1N1 10/09/2008  . Influenza Split 07/20/2011, 07/14/2012  . Influenza Whole 07/25/2008, 08/09/2009, 07/25/2010  . Influenza, High Dose Seasonal PF 08/21/2016, 08/20/2017  . Influenza,inj,Quad PF,6+ Mos 07/26/2013, 07/30/2014, 07/30/2015  . Pneumococcal Conjugate-13 05/24/2014  . Pneumococcal Polysaccharide-23 11/02/2006  . Td 02/27/2009  . Zoster 11/02/2010  . Zoster Recombinat (Shingrix)  09/01/2017    Current Medications, Allergies, Past Medical History, Past Surgical History, Family History, and Social History were reviewed in [employer] record.    Review of Systems        See HPI - all other systems neg except as noted... The patient denies anorexia, fever, weight loss, weight gain, vision loss, decreased hearing, hoarseness, chest pain, syncope, dyspnea on exertion, peripheral edema, prolonged cough, headaches, hemoptysis, abdominal pain, melena, hematochezia, severe indigestion/heartburn, hematuria, incontinence, muscle weakness, suspicious skin lesions, transient blindness, difficulty walking, depression, unusual weight change, abnormal bleeding, enlarged lymph nodes, and angioedema.     Objective:   Physical Exam     WD, WN, 77 y/o WF in NAD... GENERAL:  Alert & oriented; pleasant & cooperative... HEENT:  [place]/AT, EOM-wnl, PERRLA, pterygium left eye, EACs-clear, TMs-wnl, NOSE-clear, THROAT-clear & wnl. NECK:  Supple w/ fairROM; no JVD; normal carotid impulses w/o bruits; scar of prev thyroid surg, no nodules, no adenopathy. CHEST:  Clear to P & A; without wheezes/ rales/ or rhonchi. HEART:  Regular Rhythm; gr 2/6 SEM & w/o rubs or gallops heard... ABDOMEN:  Soft & nontender; normal bowel sounds; no organomegaly or masses detected. EXT: without deformities or arthritic changes; no varicose veins/ venous insuffic/ or edema. NEURO:  CN's intact; motor testing normal; sensory testing normal; gait normal & balance OK. DERM:  s/p surg behind left pinna  RADIOLOGY DATA:  Reviewed in the EPIC EMR & discussed w/ the patient...  LABORATORY DATA:  Reviewed in the EPIC EMR & discussed w/ the patient...   Assessment & Plan:    12/06/17>   Bana had a scare w/ the stress of her brother's passing, and ?brief syncopal spell;  No recurrent problems so far- we discussed checking MRI & CDopplers but she wants to wait since she has not had any recurrent  symptoms so far;  CXR & labs look good- she is stable & exercising;  We plan ROV recheck in 18mo sooner for any problems 06/09/18>   LTerrishais stable overall;  New finding is 2DEcho w/ mild AS/AI and MR- we will refer to CARDS for their eval & to establish follow up;  Otherw she is stable- still sees drKerr regularly for f/u  thyroid cancer & iatrogenic hypothyroidism;  Continue current med regimen the same;  We discussed new PCPs needed starting 2020   HBP>  Controlled on meds, tol well, asymptomatic, continue same...  VALVULAR HEART DIS>  Abn 2DEcho 06/2018 w/ mild AS/AI a nd mild MR => we will refer to CARDS to establish follow up...  Cerebrovasc Dis & hx TIA>  She remains asymptomatic w/o cerebral ischemic symptoms, continue Agrennox...  HYPERLIPID>  Stable on diet + Simva20...  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  GI> Hx polyps>  She is up to date and had f/u colonoscopy 12/12 as above... 1/17>  Try Bentyl for IBS like symptoms...  Osteopenia>  Managed by GYN DrRichardson & pt reports that her last BMD was "excellent"...  Skin Cancer>  She reports Moh's surg for BCE on nose, plus several "keratoses"; new lesion inside left pinna w/ surg pending...  Other medical issues as noted...   Patient's Medications  New Prescriptions   No medications on file  Previous Medications   CALCIUM CARBONATE-VITAMIN D (CALCIUM 600+D) 600-400 MG-UNIT PER TABLET    Take 1 tablet by mouth daily.    CETIRIZINE (ZYRTEC) 5 MG TABLET    Take 5 mg by mouth as needed.     CHOLECALCIFEROL (VITAMIN D) 1000 UNITS TABLET    Take 1,000 Units by mouth daily.   DEXTROMETHORPHAN-GUAIFENESIN (MUCINEX DM) 30-600 MG PER 12 HR TABLET    Take 1 tablet by mouth as needed.    LACTOBACILLUS (PROBIOTIC ACIDOPHILUS PO)    Take by mouth daily. Reported on 12/03/2015   LEVOTHYROXINE (SYNTHROID, LEVOTHROID) 100 MCG TABLET    Take 100 mcg by mouth daily.      MULTIPLE VITAMINS-MINERALS (CENTRUM SILVER PO)    Take 1 tablet by mouth daily.     TRAMADOL (ULTRAM) 50 MG TABLET    Take 1 tablet (50 mg total) by mouth 3 (three) times daily as needed.   VITAMIN E 400 UNIT CAPSULE    Take 400 Units by mouth daily.  Modified Medications   Modified Medication Previous Medication   AMLODIPINE (NORVASC) 10 MG TABLET amLODipine (NORVASC) 10 MG tablet      Take 1 tablet (10 mg total) by mouth daily.    TAKE ONE TABLET BY MOUTH DAILY   DICYCLOMINE (BENTYL) 20 MG TABLET dicyclomine (BENTYL) 20 MG tablet      Take 1 tablet (20 mg total) by mouth 2 (two) times daily as needed (abdominal cramping).    Take 1 tablet (20 mg total) by mouth 2 (two) times daily as needed (abdominal cramping).   DIPYRIDAMOLE-ASPIRIN (AGGRENOX) 200-25 MG 12HR CAPSULE dipyridamole-aspirin (AGGRENOX) 200-25 MG 12hr capsule      Take 1 capsule by mouth 2 (two) times daily.    Take 1 capsule by mouth 2 (two) times daily.   FUROSEMIDE (LASIX) 20 MG TABLET furosemide (LASIX) 20 MG tablet      Take 1/2 daily as needed for swelling    Take 1/2 daily as needed for swelling   METOPROLOL SUCCINATE (TOPROL-XL) 50 MG 24 HR TABLET metoprolol succinate (TOPROL-XL) 50 MG 24 hr tablet      TAKE 1 TABLET BY MOUTH EVERY DAY TAKE WITH OR IMMEDIATELY FOLLOWING A MEAL    TAKE 1 TABLET BY MOUTH EVERY DAY TAKE WITH OR IMMEDIATELY FOLLOWING A MEAL   SIMVASTATIN (ZOCOR) 20 MG TABLET simvastatin (ZOCOR) 20 MG tablet  Take 1 tablet (20 mg total) by mouth at bedtime.    Take 1 tablet (20 mg total) by mouth at bedtime.  Discontinued Medications   No medications on file

## 2018-06-13 ENCOUNTER — Ambulatory Visit (HOSPITAL_COMMUNITY)
Admission: RE | Admit: 2018-06-13 | Discharge: 2018-06-13 | Disposition: A | Discharge status: Home / Self Care | Payer: Medicare Other | Source: Ambulatory Visit | Attending: Cardiology | Admitting: Cardiology

## 2018-06-13 DIAGNOSIS — G459 Transient cerebral ischemic attack, unspecified: Secondary | ICD-10-CM | POA: Diagnosis not present

## 2018-06-13 DIAGNOSIS — I679 Cerebrovascular disease, unspecified: Secondary | ICD-10-CM | POA: Diagnosis not present

## 2018-06-13 DIAGNOSIS — I10 Essential (primary) hypertension: Secondary | ICD-10-CM | POA: Insufficient documentation

## 2018-06-14 ENCOUNTER — Other Ambulatory Visit: Payer: Self-pay

## 2018-06-14 ENCOUNTER — Ambulatory Visit (HOSPITAL_COMMUNITY): Payer: Medicare Other | Attending: Cardiology

## 2018-06-14 DIAGNOSIS — Z8673 Personal history of transient ischemic attack (TIA), and cerebral infarction without residual deficits: Secondary | ICD-10-CM | POA: Insufficient documentation

## 2018-06-14 DIAGNOSIS — I340 Nonrheumatic mitral (valve) insufficiency: Secondary | ICD-10-CM | POA: Insufficient documentation

## 2018-06-14 DIAGNOSIS — I10 Essential (primary) hypertension: Secondary | ICD-10-CM | POA: Insufficient documentation

## 2018-06-14 DIAGNOSIS — E785 Hyperlipidemia, unspecified: Secondary | ICD-10-CM | POA: Diagnosis not present

## 2018-06-14 DIAGNOSIS — I352 Nonrheumatic aortic (valve) stenosis with insufficiency: Secondary | ICD-10-CM | POA: Insufficient documentation

## 2018-06-14 DIAGNOSIS — G459 Transient cerebral ischemic attack, unspecified: Secondary | ICD-10-CM | POA: Diagnosis not present

## 2018-06-17 ENCOUNTER — Other Ambulatory Visit: Payer: Self-pay | Admitting: Pulmonary Disease

## 2018-06-17 DIAGNOSIS — Q248 Other specified congenital malformations of heart: Secondary | ICD-10-CM

## 2018-06-22 NOTE — Progress Notes (Signed)
Cardiology Office Note   Date:  06/23/2018   ID:  [name], DOB May 31, 1941, MRN [id number]  PCP:  Noralee Space, MD  Cardiologist:   No primary care provider on file. Referring:  Noralee Space, MD   Chief Complaint  Patient presents with  . New Patient (Initial Visit)    seen abdnormal valve, mild stenosis, denies chest pains, SOB, swelling     History of Present Illness: [name] is a 77 y.o. female who presents for establishment and for findings on echocardiogram.   She did have an echo ordered by Noralee Space, MD this month.  The EF was 60 - 65% and there was mild AS and AI.  The echocardiogram was for a presyncopal-like episode in February as well as history lightheadedness when she moves quickly or getting up from a seated position. No actual LOC. She is able to complete her daily activities such as groceries and pushing a Conservation officer, nature with difficulty in breathing, chest pain, or discomfort. No heart palpitations, PND, orthopnea, or leg swelling.   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 Recent carotid doppler August 2019 consistent with 0-39% bilateral ICA stenosis.  Past Medical History:  Diagnosis Date  . Allergic rhinitis, cause unspecified   . Aortic stenosis   . Benign neoplasm of colon   . Disorder of bone and cartilage, unspecified   . Hypercholesteremia   . Lumbago   . Malignant neoplasm of thyroid gland ([place])   . Mitral regurgitation   . Other and unspecified hyperlipidemia   . Other iatrogenic hypothyroidism   . Skin cancer    basal cell cancers  . Unspecified essential hypertension   . Unspecified transient cerebral ischemia     Past Surgical History:  Procedure Laterality Date  . basal cell carcinoma removed from inside the left ear  11/04/2012   june 2015  . COLONOSCOPY    . DILATION AND CURETTAGE OF UTERUS    . EYE SURGERY  08/22/2014  . POLYPECTOMY    . TOTAL THYROIDECTOMY       Current  Outpatient Medications  Medication Sig Dispense Refill  . amLODipine (NORVASC) 10 MG tablet Take 1 tablet (10 mg total) by mouth daily. 90 tablet 2  . Calcium Carbonate-Vitamin D (CALCIUM 600+D) 600-400 MG-UNIT per tablet Take 1 tablet by mouth daily.     . cetirizine (ZYRTEC) 5 MG tablet Take 5 mg by mouth as needed.      . cholecalciferol (VITAMIN D) 1000 UNITS tablet Take 1,000 Units by mouth daily.    [name] dextromethorphan-guaiFENesin (MUCINEX DM) 30-600 MG per 12 hr tablet Take 1 tablet by mouth as needed.     . dicyclomine (BENTYL) 20 MG tablet Take 1 tablet (20 mg total) by mouth 2 (two) times daily as needed (abdominal cramping). 60 tablet 5  . dipyridamole-aspirin (AGGRENOX) 200-25 MG 12hr capsule Take 1 capsule by mouth 2 (two) times daily. 180 capsule 3  . furosemide (LASIX) 20 MG tablet Take 1/2 daily as needed for swelling 60 tablet 5  . Lactobacillus (PROBIOTIC ACIDOPHILUS PO) Take by mouth daily. Reported on 12/03/2015    . levothyroxine (SYNTHROID, LEVOTHROID) 100 MCG tablet Take 100 mcg by mouth daily.      . metoprolol succinate (TOPROL-XL) 50 MG 24 hr tablet TAKE 1 TABLET BY MOUTH EVERY DAY TAKE WITH OR IMMEDIATELY FOLLOWING A MEAL 90 tablet 3  . Multiple Vitamins-Minerals (CENTRUM SILVER PO) Take 1  tablet by mouth daily.      . simvastatin (ZOCOR) 20 MG tablet Take 1 tablet (20 mg total) by mouth at bedtime. 90 tablet 3  . traMADol (ULTRAM) 50 MG tablet Take 1 tablet (50 mg total) by mouth 3 (three) times daily as needed. 90 tablet 5  . vitamin E 400 UNIT capsule Take 400 Units by mouth daily.     No current facility-administered medications for this visit.     Allergies:   Sulfonamide derivatives    Social History:  The patient  reports that she has never smoked. She has never used smokeless tobacco. She reports that she does not drink alcohol or use drugs.   Family History:  The patient's family history includes Bone cancer in her brother; CAD in her brother; CVA (age of  onset: 45) in her father; Colon polyps in her sister; Congestive Heart Failure in her mother; Diabetes in her mother and sister; Hypertension in her father; Lung cancer in her brother; Prostate cancer in her brother.    ROS:  Please see the history of present illness.   Otherwise, review of systems are positive for none.   All other systems are reviewed and negative.    PHYSICAL EXAM: VS:  BP 138/74 (BP Location: Left Arm, Patient Position: Sitting)   Pulse 75   Ht 5\' 1"  (1.549 m)   Wt 125 lb 9.6 oz (57 kg)   BMI 23.73 kg/m  , BMI Body mass index is 23.73 kg/m. GENERAL:  Well appearing NECK:  No jugular venous distention, waveform within normal limits, carotid upstroke brisk and symmetric, no bruits, no thyromegaly LYMPHATICS:  No cervical, inguinal adenopathy LUNGS:  Clear to auscultation bilaterally BACK:  No CVA tenderness CHEST:  Unremarkable HEART:  PMI not displaced or sustained,S1 and S2 within normal limits, no S3, no S4, no clicks, no rubs, 2 out of 6 systolic murmurs best heard at right second intecostal. No diastolic murmurs heard.  ABD:  Flat, positive bowel sounds normal in frequency in pitch, no bruits, no rebound, no guarding, no midline pulsatile mass, no hepatomegaly, no splenomegaly EXT:  2 plus pulses throughout, no edema, no cyanosis no clubbing    EKG:  EKG is ordered today. The ekg ordered today demonstrates normal sinus rhythm, rate 75. Normal axis, intervals.    Recent Labs: 12/06/2017: ALT 22; BUN 14; Creatinine, Ser 0.62; Hemoglobin 14.4; Platelets 284.0; Potassium 3.7; Sodium 139; TSH 0.43    Lipid Panel    Component Value Date/Time   CHOL 159 12/06/2017 1111   TRIG 91.0 12/06/2017 1111   HDL 63.90 12/06/2017 1111   CHOLHDL 2 12/06/2017 1111   VLDL 18.2 12/06/2017 1111   LDLCALC 77 12/06/2017 1111   LDLDIRECT 200.1 02/29/2008 1026      Wt Readings from Last 3 Encounters:  06/23/18 125 lb 9.6 oz (57 kg)  06/09/18 124 lb 9.6 oz (56.5 kg)    12/06/17 126 lb (57.2 kg)      Other studies Reviewed: Additional studies/ records that were reviewed today include: carotid ultrasound Review of the above records demonstrates: 0-39% bilateral ICA stenosis. Please see elsewhere in the note.    ASSESSMENT AND PLAN:  AS:  Mild on echocardiogram, she is not having an current symptoms. Will repeat echocardiogram in 1 year.  AI:  Mild on echo. Will repeat echo in 1 year.  HTN: Current at target 138/74. No changes in current therapy.   HLD: At target, no changes in current therapy.  THYROID CANCER: This is being follow by her endocrinologist. On synthroid.   TIA: Currently on Aggrenox, will defer to neurologist for management. No changes at this time.    Current medicines are reviewed at length with the patient today.  The patient doe snot have concerns regarding medicines.  The following changes have been made:  none  Labs/ tests ordered today include: EKG, echocardiogram    Orders Placed This Encounter  Procedures  . ECHOCARDIOGRAM COMPLETE     Disposition:   FU with me in 1 year.    Signed, Minus Breeding, MD  06/23/2018 5:33 PM    [facility]

## 2018-06-23 ENCOUNTER — Ambulatory Visit (INDEPENDENT_AMBULATORY_CARE_PROVIDER_SITE_OTHER): Payer: Medicare Other | Admitting: Cardiology

## 2018-06-23 ENCOUNTER — Encounter: Payer: Self-pay | Admitting: Cardiology

## 2018-06-23 VITALS — BP 138/74 | HR 75 | Ht 61.0 in | Wt 125.6 lb

## 2018-06-23 DIAGNOSIS — I679 Cerebrovascular disease, unspecified: Secondary | ICD-10-CM | POA: Diagnosis not present

## 2018-06-23 DIAGNOSIS — I350 Nonrheumatic aortic (valve) stenosis: Secondary | ICD-10-CM

## 2018-06-23 DIAGNOSIS — I10 Essential (primary) hypertension: Secondary | ICD-10-CM

## 2018-06-23 NOTE — Patient Instructions (Signed)
Medication Instructions:  Continue current medications  If you need a refill on your cardiac medications before your next appointment, please call your pharmacy.  Labwork: None Ordered   Testing/Procedures: Your physician has requested that you have an echocardiogram in 1 year. Echocardiography is a painless test that uses sound waves to create images of your heart. It provides your doctor with information about the size and shape of your heart and how well your heart's chambers and valves are working. This procedure takes approximately one hour. There are no restrictions for this procedure.   Follow-Up: Your physician wants you to follow-up in: 1 Year. You should receive a reminder letter in the mail two months in advance. If you do not receive a letter, please call our office in [phone] to schedule your follow-up appointment.      Thank you for choosing [facility]!!

## 2018-06-27 ENCOUNTER — Other Ambulatory Visit (INDEPENDENT_AMBULATORY_CARE_PROVIDER_SITE_OTHER): Payer: Medicare Other

## 2018-06-27 DIAGNOSIS — I10 Essential (primary) hypertension: Secondary | ICD-10-CM

## 2018-06-27 DIAGNOSIS — I350 Nonrheumatic aortic (valve) stenosis: Secondary | ICD-10-CM

## 2018-07-15 DIAGNOSIS — R7309 Other abnormal glucose: Secondary | ICD-10-CM | POA: Diagnosis not present

## 2018-07-15 DIAGNOSIS — M8580 Other specified disorders of bone density and structure, unspecified site: Secondary | ICD-10-CM | POA: Diagnosis not present

## 2018-07-15 DIAGNOSIS — E890 Postprocedural hypothyroidism: Secondary | ICD-10-CM | POA: Diagnosis not present

## 2018-07-15 DIAGNOSIS — Z85850 Personal history of malignant neoplasm of thyroid: Secondary | ICD-10-CM | POA: Diagnosis not present

## 2018-07-15 DIAGNOSIS — R7303 Prediabetes: Secondary | ICD-10-CM | POA: Diagnosis not present

## 2018-07-19 DIAGNOSIS — Z85850 Personal history of malignant neoplasm of thyroid: Secondary | ICD-10-CM | POA: Diagnosis not present

## 2018-07-19 DIAGNOSIS — R7303 Prediabetes: Secondary | ICD-10-CM | POA: Diagnosis not present

## 2018-07-19 DIAGNOSIS — E890 Postprocedural hypothyroidism: Secondary | ICD-10-CM | POA: Diagnosis not present

## 2018-07-19 DIAGNOSIS — Z833 Family history of diabetes mellitus: Secondary | ICD-10-CM | POA: Diagnosis not present

## 2018-07-20 ENCOUNTER — Ambulatory Visit (INDEPENDENT_AMBULATORY_CARE_PROVIDER_SITE_OTHER): Payer: Medicare Other

## 2018-07-20 DIAGNOSIS — Z23 Encounter for immunization: Secondary | ICD-10-CM

## 2018-09-22 ENCOUNTER — Telehealth: Payer: Self-pay | Admitting: Pulmonary Disease

## 2018-09-22 MED ORDER — AMOXICILLIN-POT CLAVULANATE 875-125 MG PO TABS: 1.0000 | ORAL_TABLET | Freq: Two times a day (BID) | ORAL | 0 refills | Status: DC

## 2018-09-22 NOTE — Telephone Encounter (Signed)
Called and spoke with pt who states she has had postnasal drainage and a mild cough. Pt states she is coughing up some green mucus.  Pt is currently at the hospital with her husband who she says had a heart attack and also has pna which all started after receiving two chemo treatments. Pt's husband has been in the hospital. Husband has been in the hospital multiple times recently (husband is also a pt of Dr. Jeannine Kitten). Husband is in the ICU on a vent and they are talking about taking him off of the vent as they are saying they don't think he is going to make it much longer.  Pt does have some chest tightness due to what all is currently going on with her husband. Pt is requesting something to be called in to help with her symptoms. Dr. Lenna Gilford, please advise on this for pt. Thanks!  Allergies  Allergen Reactions  . Sulfonamide Derivatives     REACTION: rash    Current Outpatient Medications:  .  amLODipine (NORVASC) 10 MG tablet, Take 1 tablet (10 mg total) by mouth daily., Disp: 90 tablet, Rfl: 2 .  Calcium Carbonate-Vitamin D (CALCIUM 600+D) 600-400 MG-UNIT per tablet, Take 1 tablet by mouth daily. , Disp: , Rfl:  .  cetirizine (ZYRTEC) 5 MG tablet, Take 5 mg by mouth as needed.  , Disp: , Rfl:  .  cholecalciferol (VITAMIN D) 1000 UNITS tablet, Take 1,000 Units by mouth daily., Disp: , Rfl:  .  dextromethorphan-guaiFENesin (MUCINEX DM) 30-600 MG per 12 hr tablet, Take 1 tablet by mouth as needed. , Disp: , Rfl:  .  dicyclomine (BENTYL) 20 MG tablet, Take 1 tablet (20 mg total) by mouth 2 (two) times daily as needed (abdominal cramping)., Disp: 60 tablet, Rfl: 5 .  dipyridamole-aspirin (AGGRENOX) 200-25 MG 12hr capsule, Take 1 capsule by mouth 2 (two) times daily., Disp: 180 capsule, Rfl: 3 .  furosemide (LASIX) 20 MG tablet, Take 1/2 daily as needed for swelling, Disp: 60 tablet, Rfl: 5 .  Lactobacillus (PROBIOTIC ACIDOPHILUS PO), Take by mouth daily. Reported on 12/03/2015, Disp: , Rfl:  .   levothyroxine (SYNTHROID, LEVOTHROID) 100 MCG tablet, Take 100 mcg by mouth daily.  , Disp: , Rfl:  .  metoprolol succinate (TOPROL-XL) 50 MG 24 hr tablet, TAKE 1 TABLET BY MOUTH EVERY DAY TAKE WITH OR IMMEDIATELY FOLLOWING A MEAL, Disp: 90 tablet, Rfl: 3 .  Multiple Vitamins-Minerals (CENTRUM SILVER PO), Take 1 tablet by mouth daily.  , Disp: , Rfl:  .  simvastatin (ZOCOR) 20 MG tablet, Take 1 tablet (20 mg total) by mouth at bedtime., Disp: 90 tablet, Rfl: 3 .  traMADol (ULTRAM) 50 MG tablet, Take 1 tablet (50 mg total) by mouth 3 (three) times daily as needed., Disp: 90 tablet, Rfl: 5 .  vitamin E 400 UNIT capsule, Take 400 Units by mouth daily., Disp: , Rfl:

## 2018-09-22 NOTE — Telephone Encounter (Signed)
Per SN- Augmentin 875mg , #14, take 1 tab by mouth twice a day.  Called and spoke with Patient.  Prescription sent to [facility] Oppen[facility] at [facility]  Patient stated that she would keep [place] informed of her Husband's health. Nothing further at this time.

## 2018-10-05 DIAGNOSIS — E890 Postprocedural hypothyroidism: Secondary | ICD-10-CM | POA: Diagnosis not present

## 2018-10-05 DIAGNOSIS — Z85850 Personal history of malignant neoplasm of thyroid: Secondary | ICD-10-CM | POA: Diagnosis not present

## 2018-11-09 ENCOUNTER — Ambulatory Visit (INDEPENDENT_AMBULATORY_CARE_PROVIDER_SITE_OTHER): Payer: Medicare Other | Admitting: Family Medicine

## 2018-11-09 ENCOUNTER — Encounter: Payer: Self-pay | Admitting: Family Medicine

## 2018-11-09 VITALS — BP 134/80 | HR 97 | Temp 98.1°F | Ht 61.0 in | Wt 122.4 lb

## 2018-11-09 DIAGNOSIS — Z634 Disappearance and death of family member: Secondary | ICD-10-CM

## 2018-11-09 DIAGNOSIS — I10 Essential (primary) hypertension: Secondary | ICD-10-CM

## 2018-11-09 DIAGNOSIS — E782 Mixed hyperlipidemia: Secondary | ICD-10-CM

## 2018-11-09 MED ORDER — HYDROXYZINE HCL 10 MG PO TABS: 10.0000 mg | ORAL_TABLET | Freq: Every evening | ORAL | 2 refills | Status: DC | PRN

## 2018-11-09 NOTE — Progress Notes (Signed)
Chief Complaint  Patient presents with  . New Patient (Initial Visit)    Subjective [name] is a 78 y.o. female who presents for hypertension follow up. She does monitor home blood pressures. Blood pressures ranging from 130-140's/70's on average. She is compliant with medications. Patient has these side effects of medication: none She is adhering to a healthy diet overall. Current exercise: active at home.  Hyperlipidemia Patient presents for dyslipidemia follow up. Currently being treated with Zocor and compliance with treatment thus far has been good. She denies myalgias. She is adhering to a healthy. Exercise: active at home The patient is not known to have coexisting coronary artery disease.  On November 26, the patient's husband of 47 years passed away.  Several weeks later, 2 of her sisters passed away.  She is from a family of 8 children.  She has been having issues sleeping and having headaches.  She will wake up thinking of paperwork she had to do to close states and thinking of    Past Medical History:  Diagnosis Date  . Allergic rhinitis, cause unspecified   . Aortic stenosis   . Benign neoplasm of colon   . Disorder of bone and cartilage, unspecified   . Hypercholesteremia   . Lumbago   . Malignant neoplasm of thyroid gland ([place])   . Mitral regurgitation   . Other and unspecified hyperlipidemia   . Other iatrogenic hypothyroidism   . Skin cancer    basal cell cancers  . Unspecified essential hypertension   . Unspecified transient cerebral ischemia     Review of Systems Cardiovascular: no chest pain Respiratory:  no shortness of breath  Exam BP 134/80 (BP Location: Left Arm, Patient Position: Sitting, Cuff Size: Normal)   Pulse 97   Temp 98.1 F (36.7 C) (Oral)   Ht 5\' 1"  (1.549 m)   Wt 122 lb 6 oz (55.5 kg)   SpO2 96%   BMI 23.12 kg/m  General:  well developed, well nourished, in no apparent distress Heart: RRR, no bruits, +LE  edema Lungs: clear to auscultation, no accessory muscle use Psych: well oriented with normal range of affect and appropriate judgment/insight  Essential hypertension - Plan: Comprehensive metabolic panel  Mixed hyperlipidemia - Plan: Lipid panel  Bereavement - Plan: hydrOXYzine (ATARAX/VISTARIL) 10 MG tablet   Check labs. Counseled on diet and exercise.  Continue current medications for now. Patient is very realistic about what is likely causing her symptoms.  I did discuss that I do not think hypothyroidism would cause this issue.  She is following with her endocrinologist regarding this.  Recommended an as needed medication after having a discussion with her.  We will hold off on a daily medicine.  She realizes time will be the best thing for her.  Low-dose of hydroxyzine nightly as needed until then. F/u in 6 weeks to reck. The patient voiced understanding and agreement to the plan.  [place], DO 11/09/18  4:36 PM

## 2018-11-09 NOTE — Patient Instructions (Signed)
Try the hydroxyzine at night if you are having issues sleeping. Let me know if you have side effects with medication (getting too sleepy).  Keep the diet clean and stay active.  Give [place] 2-3 business days to get the results of your labs back.   Let us know if you need anything.

## 2018-11-09 NOTE — Progress Notes (Signed)
Pre visit review using our clinic review tool, if applicable. No additional management support is needed unless otherwise documented below in the visit note. 

## 2018-11-16 ENCOUNTER — Telehealth: Payer: Self-pay | Admitting: Family Medicine

## 2018-11-16 NOTE — Telephone Encounter (Signed)
Have her try 2 tabs nightly and follow up Monday.

## 2018-11-16 NOTE — Telephone Encounter (Signed)
Copied from [place] [phone]. Topic: General - Other >> Nov 16, 2018  3:52 PM Keene Breath wrote: Reason for CRM: Patient called to speak with the nurse or doctor regarding her medication hydrOXYzine (ATARAX/VISTARIL) 10 MG tablet.  Patient states she is still having issues with sleeping and anxiety.  Please advise and call patient back as soon as possible.  CB# [phone]

## 2018-11-17 NOTE — Telephone Encounter (Signed)
appt scheduled for Monday 11/21/2018. Patient verbalized understanding of PCP instructions regarding medication.

## 2018-11-21 ENCOUNTER — Ambulatory Visit (INDEPENDENT_AMBULATORY_CARE_PROVIDER_SITE_OTHER): Payer: Medicare Other | Admitting: Family Medicine

## 2018-11-21 ENCOUNTER — Encounter: Payer: Self-pay | Admitting: Family Medicine

## 2018-11-21 VITALS — BP 136/60 | HR 81 | Temp 98.2°F | Resp 18 | Wt 123.4 lb

## 2018-11-21 DIAGNOSIS — Z634 Disappearance and death of family member: Secondary | ICD-10-CM

## 2018-11-21 MED ORDER — TRAZODONE HCL 50 MG PO TABS: 50.0000 mg | ORAL_TABLET | Freq: Every day | ORAL | 1 refills | Status: DC

## 2018-11-21 NOTE — Progress Notes (Signed)
CC: f/u bereavement  Subjective: Patient is a 78 y.o. female here for f/u bereavement.  Patient recently lost 2 of her sisters and her husband and less 2 months.  She has been having difficulty sleeping.  She does fine during the day as she is keeping busy.  She was started on hydroxyzine 10 mg at night.  This helped very slightly.  She was increased to 20 mg nightly but did not notice a good benefit.  She did feel some abdominal pain and headaches after starting the medicine.  She does not want to be affected during the day and she does not want to be on anything addictive.  ROS: Psych: +insomnia  Past Medical History:  Diagnosis Date  . Allergic rhinitis, cause unspecified   . Aortic stenosis   . Benign neoplasm of colon   . Disorder of bone and cartilage, unspecified   . Hypercholesteremia   . Lumbago   . Malignant neoplasm of thyroid gland ([place])   . Mitral regurgitation   . Other and unspecified hyperlipidemia   . Other iatrogenic hypothyroidism   . Skin cancer    basal cell cancers  . Unspecified essential hypertension   . Unspecified transient cerebral ischemia     Objective: BP 136/60 (BP Location: Left Arm, Patient Position: Sitting, Cuff Size: Normal)   Pulse 81   Temp 98.2 F (36.8 C) (Oral)   Resp 18   Wt 123 lb 6.4 oz (56 kg)   SpO2 98%   BMI 23.32 kg/m  General: Awake, appears stated age HEENT: MMM, EOMi, ears neg, nose neg Heart: RRR Lungs: CTAB, no rales, wheezes or rhonchi. No accessory muscle use Psych: Age appropriate judgment and insight, normal affect and mood  Assessment and Plan: Bereavement - Plan: traZODone (DESYREL) 50 MG tablet  Stop hydroxyzine, start trazodone. Follow-up as originally scheduled in around 1 month. The patient voiced understanding and agreement to the plan.  [place], DO 11/21/18  12:00 PM

## 2018-11-21 NOTE — Patient Instructions (Signed)
If the trazodone is more than $3, ask them to run it without using your insurance to see how much it is.   Send me a message in a week or so if you aren't improving. I also want to know if the headache and abdominal pain get better, but the sleep does not get better.  Hold off on the hydroxyzine for now.    Let us know if you need anything.

## 2018-11-28 DIAGNOSIS — E890 Postprocedural hypothyroidism: Secondary | ICD-10-CM | POA: Diagnosis not present

## 2018-12-05 ENCOUNTER — Telehealth: Payer: Self-pay | Admitting: Family Medicine

## 2018-12-05 NOTE — Telephone Encounter (Signed)
Copied from [place] [id number]. Topic: General - Other >> Dec 05, 2018  9:20 AM [place], Lanice Schwab wrote: Patient is supposed to let [facility] know how she is progressing.  Her headaches and abdominal pain are better.  Her thyroid is stable with meds.  She is still having trouble sleeping.  She has an appointment scheduled 12/19/17.   Patient informed of PCP instructions. She verbalized understanding.

## 2018-12-05 NOTE — Telephone Encounter (Signed)
Noted. She could take 2 tabs of trazodone nightly to help with sleep. I will plan to see her in two weeks. TY.

## 2018-12-07 ENCOUNTER — Encounter: Payer: Self-pay | Admitting: Family Medicine

## 2018-12-07 ENCOUNTER — Ambulatory Visit (INDEPENDENT_AMBULATORY_CARE_PROVIDER_SITE_OTHER): Payer: Medicare Other | Admitting: Family Medicine

## 2018-12-07 VITALS — BP 110/70 | HR 101 | Temp 98.2°F | Ht 61.0 in | Wt 123.0 lb

## 2018-12-07 DIAGNOSIS — L0100 Impetigo, unspecified: Secondary | ICD-10-CM | POA: Diagnosis not present

## 2018-12-07 DIAGNOSIS — I679 Cerebrovascular disease, unspecified: Secondary | ICD-10-CM | POA: Diagnosis not present

## 2018-12-07 DIAGNOSIS — Z634 Disappearance and death of family member: Secondary | ICD-10-CM | POA: Diagnosis not present

## 2018-12-07 MED ORDER — TRAZODONE HCL 100 MG PO TABS: 100.0000 mg | ORAL_TABLET | Freq: Every day | ORAL | 3 refills | Status: DC

## 2018-12-07 MED ORDER — CEPHALEXIN 500 MG PO CAPS: 500.0000 mg | ORAL_CAPSULE | Freq: Three times a day (TID) | ORAL | 0 refills | Status: AC

## 2018-12-07 NOTE — Progress Notes (Signed)
Chief Complaint  Patient presents with  . Insomnia    [name] is a 78 y.o. female here for a skin complaint.  Duration: several days Location: scalp Pruritic? No Painful? Yes Drainage? No New soaps/lotions/topicals/detergents? No  Here to follow-up for bereavement.  She is starting to sleep little bit better.  She is currently taking trazodone 100 mg/day.  She takes around 30 minutes before bed.  She will wake up between 3 AM and 5 AM, usually having to use the restroom and racing thoughts will prevent her from falling back asleep.  She does have an opportunity to see counseling through hospice after her husband had recently passed away.  There will be no charge to her.  Hx of stroke, currently on Aggrenox that recently changed tiers on insurance. Now $173/mo. Not interested in taking 2 separate medications, would rather just pay the $173.  ROS:  Const: No fevers Skin: As noted in HPI  Past Medical History:  Diagnosis Date  . Allergic rhinitis, cause unspecified   . Aortic stenosis   . Benign neoplasm of colon   . Disorder of bone and cartilage, unspecified   . Hypercholesteremia   . Lumbago   . Malignant neoplasm of thyroid gland ([place])   . Mitral regurgitation   . Other and unspecified hyperlipidemia   . Other iatrogenic hypothyroidism   . Skin cancer    basal cell cancers  . Unspecified essential hypertension   . Unspecified transient cerebral ischemia     BP 110/70 (BP Location: Left Arm, Patient Position: Sitting, Cuff Size: Normal)   Pulse (!) 101   Temp 98.2 F (36.8 C) (Oral)   Ht 5\' 1"  (1.549 m)   Wt 123 lb (55.8 kg)   SpO2 98%   BMI 23.24 kg/m  Gen: awake, alert, appearing stated age Lungs: No accessory muscle use Skin: See below. No drainage, erythema, TTP, fluctuance, excoriation Psych: Age appropriate judgment and insight  Impetigo - Plan: cephALEXin (KEFLEX) 500 MG capsule  Bereavement - Plan: traZODone (DESYREL) 100 MG  tablet  Cerebrovascular disease, unspecified  Keflex for scalp.  Try not to scratch. Maintain increased dose of trazodone for the next 4 weeks.  If no improvement, will consider further dosage increase versus considering daily SSRI versus counseling versus long-acting benzodiazepine. Reached out to the pharmacy team.  There is no equivalent combination medicine to Aggrenox.  She states that she would rather pay the increased price rather than take 2 separate medications.  She will let us know if anything changes. F/u in 4 weeks. The patient voiced understanding and agreement to the plan.  [place], DO 12/07/18 2:41 PM

## 2018-12-07 NOTE — Patient Instructions (Addendum)
Give me time to find out a suitable alternative to your Aggrenox.   Let me know if the antibiotic is too expensive. Don't pick at the area.  Please consider counseling. Contact [phone] to schedule an appointment or inquire about cost/insurance coverage.  Let us know if you need anything.

## 2018-12-13 ENCOUNTER — Telehealth: Payer: Self-pay | Admitting: Family Medicine

## 2018-12-13 NOTE — Telephone Encounter (Signed)
Copied from [place] [id number]. Topic: Quick Communication - Rx Refill/Question >> Dec 13, 2018 11:26 AM Alanda Slim E wrote: Medication: Pt is currently taking traZODone (DESYREL) 100 MG tablet but was advised by Dr. Nani Ravens that he would consider a low dose prescription of Xanax. Pt wanted to know if that would be called in/ Pt also stated that the lumps/sores on the back of her head are drying up but she thinks there are so many and dont know if more are coming up or what.Pt wants to know if Dr. Nani Ravens wants to see her  please advise   Preferred Pharmacy (with phone number or street name):[facility] [address] [place] - [address] [phone] (Phone) [phone] (Fax)

## 2018-12-14 MED ORDER — ALPRAZOLAM 0.25 MG PO TABS: 0.2500 mg | ORAL_TABLET | Freq: Two times a day (BID) | ORAL | 0 refills | Status: DC | PRN

## 2018-12-14 NOTE — Telephone Encounter (Signed)
Patient informed of PCP instructions. 

## 2018-12-14 NOTE — Telephone Encounter (Signed)
Patient informed of instructions. Questions? Does she continue taking trazodone along with xanax? If yes should she take xanax at night  Or as needed daily?

## 2018-12-14 NOTE — Telephone Encounter (Signed)
Low dose Xanax called in. If she is still having issues with her scalp, I would like to see. If she is doing better, we can wait until her next scheduled appointment. TY.

## 2018-12-14 NOTE — Telephone Encounter (Signed)
I would take trazodone nightly and use Xanax as needed.  Okay to take both at the same time.  Xanax can be used during the day or at night.

## 2018-12-16 ENCOUNTER — Encounter: Payer: Self-pay | Admitting: Family Medicine

## 2018-12-16 ENCOUNTER — Ambulatory Visit (INDEPENDENT_AMBULATORY_CARE_PROVIDER_SITE_OTHER): Payer: Medicare Other | Admitting: Family Medicine

## 2018-12-16 VITALS — BP 108/70 | HR 85 | Temp 97.6°F | Ht 61.0 in | Wt 122.1 lb

## 2018-12-16 DIAGNOSIS — L0100 Impetigo, unspecified: Secondary | ICD-10-CM

## 2018-12-16 MED ORDER — DOXYCYCLINE HYCLATE 100 MG PO TABS: 100.0000 mg | ORAL_TABLET | Freq: Two times a day (BID) | ORAL | 0 refills | Status: DC

## 2018-12-16 NOTE — Patient Instructions (Signed)
If this antibiotic is not helpful for your skin, I think we need to see your skin specialist. Call and make an appointment today, cancel if you are doing better.  When you do wash it, use only shampoo and water. Do not vigorously scrub. Keep the areas clean and dry.    Things to look out for: increasing pain not relieved by ibuprofen/acetaminophen, fevers, spreading redness, drainage of pus, or foul odor.  Aim to do some physical exertion for 150 minutes per week. This is typically divided into 5 days per week, 30 minutes per day. The activity should be enough to get your heart rate up. Anything is better than nothing if you have time constraints.  Let us know if you need anything.

## 2018-12-16 NOTE — Progress Notes (Signed)
Chief Complaint  Patient presents with  . sores on head    [name] is a 78 y.o. female here for a skin complaint.  Duration: several weeks Location: scalp Pruritic? No Painful? No Drainage? No New soaps/lotions/topicals/detergents? No Sick contacts? No Other associated symptoms: Scaling/scabbing Therapies tried thus far: Keflex  ROS:  Const: No fevers Skin: As noted in HPI  Past Medical History:  Diagnosis Date  . Allergic rhinitis, cause unspecified   . Aortic stenosis   . Benign neoplasm of colon   . Disorder of bone and cartilage, unspecified   . Hypercholesteremia   . Lumbago   . Malignant neoplasm of thyroid gland ([place])   . Mitral regurgitation   . Other and unspecified hyperlipidemia   . Other iatrogenic hypothyroidism   . Skin cancer    basal cell cancers  . Unspecified essential hypertension   . Unspecified transient cerebral ischemia     BP 108/70 (BP Location: Left Arm, Patient Position: Sitting, Cuff Size: Normal)   Pulse 85   Temp 97.6 F (36.4 C) (Oral)   Ht 5\' 1"  (1.549 m)   Wt 122 lb 2 oz (55.4 kg)   SpO2 93%   BMI 23.08 kg/m  Gen: awake, alert, appearing stated age Lungs: No accessory muscle use Skin: See below. No drainage, erythema, TTP, fluctuance, excoriation Psych: Age appropriate judgment and insight            Impetigo - Plan: doxycycline (VIBRA-TABS) 100 MG tablet  Looks like impetigo still, if this does not help, she will sched appt w her derm team.  F/u as originally scheduled with me otherwise.  The patient voiced understanding and agreement to the plan.  [place], DO 12/16/18 11:39 AM

## 2018-12-19 ENCOUNTER — Ambulatory Visit: Payer: Medicare Other | Admitting: Family Medicine

## 2018-12-26 ENCOUNTER — Telehealth: Payer: Self-pay | Admitting: Family Medicine

## 2018-12-26 MED ORDER — ALPRAZOLAM 0.25 MG PO TABS: 0.2500 mg | ORAL_TABLET | Freq: Two times a day (BID) | ORAL | 0 refills | Status: DC | PRN

## 2018-12-26 NOTE — Telephone Encounter (Signed)
Copied from [place] [id number]. Topic: Quick Communication - Rx Refill/Question >> Dec 13, 2018 11:26 AM Alanda Slim E wrote: Medication: Pt is currently taking traZODone (DESYREL) 100 MG tablet but was advised by Dr. Nani Ravens that he would consider a low dose prescription of Xanax. Pt wanted to know if that would be called in/ Pt also stated that the lumps/sores on the back of her head are drying up but she thinks there are so many and dont know if more are coming up or what.Pt wants to know if Dr. Nani Ravens wants to see her  please advise   Preferred Pharmacy (with phone number or street name):[facility], [place] [phone] (Phone) [phone] (Fax) >> Dec 26, 2018  3:14 PM Percell Belt A wrote: Pt called in and would like to know if she could get a refill on the xanex, just enough to make it to her appt next week.  She stated it has been helping with her getting some sleep   [place] at [facility]

## 2018-12-27 DIAGNOSIS — L282 Other prurigo: Secondary | ICD-10-CM | POA: Diagnosis not present

## 2018-12-27 DIAGNOSIS — L218 Other seborrheic dermatitis: Secondary | ICD-10-CM | POA: Diagnosis not present

## 2018-12-27 DIAGNOSIS — L309 Dermatitis, unspecified: Secondary | ICD-10-CM | POA: Diagnosis not present

## 2018-12-28 ENCOUNTER — Encounter: Payer: Self-pay | Admitting: Family Medicine

## 2018-12-28 ENCOUNTER — Ambulatory Visit (INDEPENDENT_AMBULATORY_CARE_PROVIDER_SITE_OTHER): Payer: Medicare Other | Admitting: Family Medicine

## 2018-12-28 VITALS — BP 120/78 | HR 84 | Temp 98.2°F | Ht 61.0 in | Wt 122.8 lb

## 2018-12-28 DIAGNOSIS — Z634 Disappearance and death of family member: Secondary | ICD-10-CM

## 2018-12-28 MED ORDER — SERTRALINE HCL 50 MG PO TABS: 50.0000 mg | ORAL_TABLET | Freq: Every day | ORAL | 3 refills | Status: DC

## 2018-12-28 NOTE — Progress Notes (Signed)
CC: Bereavement  Subjective [name] presents for f/u anxiety/depression/grief. Here w her sister.   She is currently being treated with trazodone and Xanax prn.   Reports no improvement since treatment. Might be having AE's from the medicine. No thoughts of harming self or others. +famhx of depression in sister, did well w Zoloft. Pt is following with a counselor/psychologist.  ROS Psych: No homicidal or suicidal thoughts  Past Medical History:  Diagnosis Date  . Allergic rhinitis, cause unspecified   . Aortic stenosis   . Benign neoplasm of colon   . Disorder of bone and cartilage, unspecified   . Hypercholesteremia   . Lumbago   . Malignant neoplasm of thyroid gland ([place])   . Mitral regurgitation   . Other and unspecified hyperlipidemia   . Other iatrogenic hypothyroidism   . Skin cancer    basal cell cancers  . Unspecified essential hypertension   . Unspecified transient cerebral ischemia      Exam BP 120/78 (BP Location: Left Arm, Patient Position: Sitting, Cuff Size: Normal)   Pulse 84   Temp 98.2 F (36.8 C) (Oral)   Ht 5\' 1"  (1.549 m)   Wt 122 lb 12.8 oz (55.7 kg)   SpO2 97%   BMI 23.20 kg/m  General:  well developed, well nourished, in no apparent distress Neck: neck supple without adenopathy, thyromegaly, or masses Lungs:  No access msc use Psych: Flat affect and age-appropriate judgement/insight, alert and oriented x4.  Assessment and Plan  Bereavement - Plan: sertraline (ZOLOFT) 50 MG tablet  Start Zoloft, stop trazodone. Cont w counseling. Try to stay active. F/u in 4 weeks. The patient voiced understanding and agreement to the plan.  [place], DO 12/28/18 3:27 PM

## 2018-12-28 NOTE — Patient Instructions (Addendum)
Continue on the Xanax as needed. If you are having trouble falling asleep.   You can take this new medicine at any time of the day. Try to take it around the same time.  Stay active.  Let us know if you need anything.

## 2018-12-29 ENCOUNTER — Ambulatory Visit: Payer: Self-pay | Admitting: Family Medicine

## 2018-12-29 NOTE — Telephone Encounter (Signed)
Sister [name] called.   The pt [name] was there in the background answering questions when asked.  The pt woke up feeling shaky and anxious this morning.   She is feeling better now after eating and taking a Xanax that Dr. Nani Ravens started her on yesterday for anxiety and depression.   Her husband recently passed away.  She or her sister, [name] will call back if this happens again since she is feeling better now.  See triage notes.      Reason for Disposition . Caller has medication question only, adult not sick, and triager answers question    Pt woke up feeling shaky and anxious this morning.  Sister gave her a Xanax and pt is feeling better.   Pt and or sister, [name] will call back if this happens again since she is feeling better now.  Answer Assessment - Initial Assessment Questions 1. SYMPTOMS: "Do you have any symptoms?"    Sister [name] called in for sister who is there with her.   [name]  called me at 7:00 am.this morning.   [name] has been awake since 3:00 am.   Dr. Nani Ravens  Stopped the Trazodone.  She took it about a month and it was not working.  So Xanax was started.   When she called this morning she c//o being very shaky and nervous this morning.  So [name] had her take a Xanax.   [name] is eating breakfast now and is feeling much better.   [name]'s husband passed away recently.     She has depression and anxiety from her husband's passing.    Xanax is helping her.      She is eating and is feeling better now.   Pt is drinking orange juice and feeling better.   Not diabetic.     She also takes a thyroid medicine.    Her 2 older sisters passed away recently too. [name] is going to continue watching [name] and call us back if this happens again. 2. SEVERITY: If symptoms are present, ask "Are they mild, moderate or severe?"     I instructed [name] to call us back if [name] continues to have problems with being shaky and nervous again.   Both [name] and [name]  were agreeable to this plan.  Protocols used: MEDICATION QUESTION CALL-A-AH

## 2018-12-30 ENCOUNTER — Encounter: Payer: Self-pay | Admitting: Family Medicine

## 2018-12-30 ENCOUNTER — Telehealth: Payer: Self-pay | Admitting: Family Medicine

## 2018-12-30 ENCOUNTER — Ambulatory Visit (INDEPENDENT_AMBULATORY_CARE_PROVIDER_SITE_OTHER): Payer: Medicare Other | Admitting: Family Medicine

## 2018-12-30 VITALS — BP 122/60 | HR 90 | Temp 97.4°F | Ht 61.0 in | Wt 123.1 lb

## 2018-12-30 DIAGNOSIS — Z634 Disappearance and death of family member: Secondary | ICD-10-CM

## 2018-12-30 MED ORDER — ALPRAZOLAM 0.5 MG PO TABS: 0.5000 mg | ORAL_TABLET | Freq: Every evening | ORAL | 0 refills | Status: DC | PRN

## 2018-12-30 MED ORDER — ALPRAZOLAM 0.5 MG PO TABS: ORAL_TABLET | ORAL | 0 refills | Status: DC

## 2018-12-30 NOTE — Addendum Note (Signed)
Addended by: Ames Coupe on: 12/30/2018 03:06 PM   Modules accepted: Orders

## 2018-12-30 NOTE — Progress Notes (Signed)
Chief Complaint  Patient presents with  . Panic Attack    Subjective: Patient is a 78 y.o. female here for panic attacks. Here w her sister [name].   Is going through bereavement as 2 sisters nad spouse passed away late February 08, 2018. She was seen 2 d ago and started on Zoloft. Xanax 0.25 mg prn had been helpful prior to that. She is following with counseling provided by hospice, but does not have a f/u appt. Xanax worked, but not fully this AM. Failed trazodone and hydroxyzine.   ROS: Psych: +panic attacks  Past Medical History:  Diagnosis Date  . Allergic rhinitis, cause unspecified   . Aortic stenosis   . Benign neoplasm of colon   . Disorder of bone and cartilage, unspecified   . Hypercholesteremia   . Lumbago   . Malignant neoplasm of thyroid gland ([place])   . Mitral regurgitation   . Other and unspecified hyperlipidemia   . Other iatrogenic hypothyroidism   . Skin cancer    basal cell cancers  . Unspecified essential hypertension   . Unspecified transient cerebral ischemia     Objective: BP 122/60 (BP Location: Left Arm, Patient Position: Sitting, Cuff Size: Normal)   Pulse 90   Temp (!) 97.4 F (36.3 C) (Oral)   Ht 5\' 1"  (1.549 m)   Wt 123 lb 2 oz (55.8 kg)   SpO2 96%   BMI 23.26 kg/m  General: Awake, appears stated age Lungs: CTAB, no rales, wheezes or rhonchi. No accessory muscle use Psych: Age appropriate judgment and insight, flat affect, depressed mood  Assessment and Plan: Bereavement - Plan: ALPRAZolam (XANAX) 0.5 MG tablet  0.5 mg prn panic attacks. 0.25 mg qhs prn and if she wakes up in middle of night prn. Encouraged to see counselor.  F/u in 1 week if no better, otherwise as originally planned. Could consider changed to Valium/Klonopin for longer action. The patient and her sister voiced understanding and agreement to the plan.  Greater than 25 minutes were spent face to face with the patient with greater than 50% of this time spent counseling on bereavement,  sleep, medication options, and prognosis.   [place], DO 12/30/18  10:55 AM

## 2018-12-30 NOTE — Patient Instructions (Signed)
If you have a panic attack, take a full tab of Xanax of the new dose.  If you are having racing thoughts before bed, take half a tab. If you wake up with racing thoughts and cannot fall back asleep, take another half tab.   I strongly encourage you to get back in with the counseling team.  Stay active.  Let us know if you need anything.

## 2018-12-30 NOTE — Telephone Encounter (Signed)
Devin, RPH from [facility] called to get clarification on the Alprazolam prescription received. She says they received a 0.5 mg with instructions to take oral at bedtime. She says the patient is there and is saying she's to take it differently. I advised it's noted in the OV note the below instructions. She asks if a verbal can be given or fax a new prescription with these instructions, since the patient is elderly. I called the office and spoke to [place], [facility] who was advised by Dr. Nani Ravens that he will resend a new prescription with the instructions noted below. I advised Devin who verbalized understanding.  Instructions   If you have a panic attack, take a full tab of Xanax of the new dose.  If you are having racing thoughts before bed, take half a tab. If you wake up with racing thoughts and cannot fall back asleep, take another half tab.

## 2019-01-03 ENCOUNTER — Ambulatory Visit: Payer: Self-pay | Admitting: *Deleted

## 2019-01-03 NOTE — Telephone Encounter (Signed)
I would verify with prescriber (dermatologist), but the rule of thumb is to avoid it on open wounds like a cut. If she can get those main areas without getting it on the area that cracked, I think she should be fine. I do appreciate where it is on her scalp and this could be difficult. TY.

## 2019-01-03 NOTE — Telephone Encounter (Signed)
Message from Margot Ables sent at 01/03/2019 8:31 AM EST   Summary: scalp irritation   Pt sister [name] is helping her since the passing of her husband. The stress/anxiety causes irritation on her scalp. The dermatologist gave a steroid solution to use on her head that has caused a crack and some bleeding last night. The bleeding has stopped but wondering if she should hold use of the steroid solution for a day or two. Please advise         Pt's sister called to see if she needed to hold off on the steroid solution that her sister is using for her scalp. She had had some bleeding she thinks from removing a scab on her scalp. Advised to call the dermatologist regarding this solution since it was prescribed by them.  She also wanted her pcp to know that she is still having the panic attacks. She is taking the xanax as prescribed. It helps in the morning but she starts getting them again in the afternoon.  She has an appointment scheduled for in the morning with her pcp. Routing to flow at [facility] at [facility] for any other suggestions.

## 2019-01-04 ENCOUNTER — Ambulatory Visit (INDEPENDENT_AMBULATORY_CARE_PROVIDER_SITE_OTHER): Payer: Medicare Other | Admitting: Family Medicine

## 2019-01-04 ENCOUNTER — Encounter: Payer: Self-pay | Admitting: Family Medicine

## 2019-01-04 ENCOUNTER — Ambulatory Visit: Payer: Medicare Other | Admitting: Family Medicine

## 2019-01-04 VITALS — BP 110/60 | HR 80 | Temp 97.6°F | Ht 61.0 in | Wt 121.1 lb

## 2019-01-04 DIAGNOSIS — Z634 Disappearance and death of family member: Secondary | ICD-10-CM | POA: Diagnosis not present

## 2019-01-04 MED ORDER — LORAZEPAM 0.5 MG PO TABS: 0.5000 mg | ORAL_TABLET | Freq: Three times a day (TID) | ORAL | 1 refills | Status: DC | PRN

## 2019-01-04 MED ORDER — QUETIAPINE FUMARATE 25 MG PO TABS: 25.0000 mg | ORAL_TABLET | Freq: Every day | ORAL | 1 refills | Status: DC

## 2019-01-04 NOTE — Progress Notes (Signed)
Chief Complaint  Patient presents with  . Anxiety    Subjective: Patient is a 78 y.o. female here for f/u bereavement.h ere w sisters.   Pt started on Xanax and Zoloft, not doing well. Still not sleeping and having more panic attacks. Not getting out of house much. Both of her sisters are here to help out.  Xanax is not helpful.  She still has only had one appointment from the counseling team with hospice.  ROS: Psych: As noted in HPI  Past Medical History:  Diagnosis Date  . Allergic rhinitis, cause unspecified   . Aortic stenosis   . Benign neoplasm of colon   . Disorder of bone and cartilage, unspecified   . Hypercholesteremia   . Lumbago   . Malignant neoplasm of thyroid gland ([place])   . Mitral regurgitation   . Other and unspecified hyperlipidemia   . Other iatrogenic hypothyroidism   . Skin cancer    basal cell cancers  . Unspecified essential hypertension   . Unspecified transient cerebral ischemia     Objective: BP 110/60 (BP Location: Left Arm, Patient Position: Sitting, Cuff Size: Normal)   Pulse 80   Temp 97.6 F (36.4 C) (Oral)   Ht 5\' 1"  (1.549 m)   Wt 121 lb 2 oz (54.9 kg)   SpO2 98%   BMI 22.89 kg/m  General: Awake, appears stated age HEENT: MMM, EOMi Heart: RRR, no murmurs Lungs: CTAB, no rales, wheezes or rhonchi. No accessory muscle use Psych: Age appropriate judgment and insight, normal affect and mood  Assessment and Plan: Bereavement - Plan: LORazepam (ATIVAN) 0.5 MG tablet, QUEtiapine (SEROQUEL) 25 MG tablet  Cont Zoloft for now. Add Seroquel qhs and Ativan to replace the Xanax.  Psychiatry contact information provided.  Continue with hospice counseling for now.  If no improvement, we will provide our resource. Follow-up in 5 days if no improvement. The patient voiced understanding and agreement to the plan.  Greater than 25 minutes were spent face to face with the patient with greater than 50% of this time spent counseling on bereavement,  counseling, psychiatry, follow up, medication changes, and her scalp issue.  [place], DO 01/04/19  11:45 AM

## 2019-01-04 NOTE — Patient Instructions (Addendum)
I want you to go outside and walk twice daily for at least 5 minutes (weather permitting).   Keep eating.  If the new medication we called in is too expensive, do not fill it and let me know.   I will plan to see you Monday if no improvement, if you are starting to feel better, we can reconvene as originally planned.   [facility] [address] [place], [place] [address] [phone]  [facility] [address] [place], [place] [address] [phone]  [facility] [place], [place] [address] [phone]  [facility] [address], [place], [place], #[phone] [address], [place], [place], [place]  [facility] [place], [place] and [place], [place], [place]  [facility] [place], [place]  Call one of these offices sooner than later as it can take 2-3 months to get a new patient appointment.

## 2019-01-04 NOTE — Telephone Encounter (Signed)
Patient informed of PCP instructions. She verbalized understanding. 

## 2019-01-09 ENCOUNTER — Encounter: Payer: Self-pay | Admitting: Family Medicine

## 2019-01-09 ENCOUNTER — Ambulatory Visit (INDEPENDENT_AMBULATORY_CARE_PROVIDER_SITE_OTHER): Payer: Medicare Other | Admitting: Family Medicine

## 2019-01-09 VITALS — BP 138/64 | HR 85 | Temp 97.6°F | Ht 61.0 in | Wt 123.0 lb

## 2019-01-09 DIAGNOSIS — E782 Mixed hyperlipidemia: Secondary | ICD-10-CM

## 2019-01-09 DIAGNOSIS — I10 Essential (primary) hypertension: Secondary | ICD-10-CM

## 2019-01-09 DIAGNOSIS — Z634 Disappearance and death of family member: Secondary | ICD-10-CM

## 2019-01-09 MED ORDER — SIMVASTATIN 20 MG PO TABS: 20.0000 mg | ORAL_TABLET | Freq: Every day | ORAL | 3 refills | Status: DC

## 2019-01-09 MED ORDER — ASPIRIN-DIPYRIDAMOLE ER 25-200 MG PO CP12: 1.0000 | ORAL_CAPSULE | Freq: Two times a day (BID) | ORAL | 3 refills | Status: DC

## 2019-01-09 MED ORDER — METOPROLOL SUCCINATE ER 50 MG PO TB24: ORAL_TABLET | ORAL | 3 refills | Status: DC

## 2019-01-09 MED ORDER — AMLODIPINE BESYLATE 10 MG PO TABS: 10.0000 mg | ORAL_TABLET | Freq: Every day | ORAL | 2 refills | Status: DC

## 2019-01-09 MED ORDER — FUROSEMIDE 20 MG PO TABS: ORAL_TABLET | ORAL | 5 refills | Status: DC

## 2019-01-09 NOTE — Patient Instructions (Addendum)
Keep active.   Give [place] 2-3 business days to get the results of your labs back.   Let us know if you need anything.

## 2019-01-09 NOTE — Progress Notes (Signed)
Chief Complaint  Patient presents with  . Anxiety    Subjective: Patient is a 78 y.o. female here for bereavement f/u.  She is here with 2 of her sisters.  The patient was started on Zoloft 2 weeks ago.  She has failed several benzodiazepines.  She is currently on Zoloft 50 mg daily, recently started on sertraline 25 mg nightly, and Ativan 0.5 mg 3 times daily as needed.  She reports feeling much better since her last appointment.  She has been taking the Ativan around 3 times daily.  She feels it is controlling her panic attacks and helping her sleep.  She is tolerating the medicine well with no adverse effects.  She did go back to see the bereavement counseling team with hospice.  She has been making efforts to go outside and walk.   ROS: Psych: as noted in HPI  Past Medical History:  Diagnosis Date  . Allergic rhinitis, cause unspecified   . Aortic stenosis   . Benign neoplasm of colon   . Disorder of bone and cartilage, unspecified   . Hypercholesteremia   . Lumbago   . Malignant neoplasm of thyroid gland ([place])   . Mitral regurgitation   . Other and unspecified hyperlipidemia   . Other iatrogenic hypothyroidism   . Skin cancer    basal cell cancers  . Unspecified essential hypertension   . Unspecified transient cerebral ischemia     Objective: BP 138/64 (BP Location: Left Arm, Patient Position: Sitting, Cuff Size: Normal)   Pulse 85   Temp 97.6 F (36.4 C) (Oral)   Ht 5\' 1"  (1.549 m)   Wt 123 lb (55.8 kg)   SpO2 97%   BMI 23.24 kg/m  General: Awake, appears stated age Lungs: No accessory muscle use Psych: Age appropriate judgment and insight, normal affect and mood brighter than last time  Assessment and Plan: Bereavement  Mixed hyperlipidemia - Plan: Lipid panel  Essential hypertension - Plan: Comprehensive metabolic panel  Cont Seroquel, Zoloft and Ativan. Cont to try to get in w psych. Cont w counseling.  F/u in 1 mo, will f/u on labs while here.  The  patient voiced understanding and agreement to the plan.  [place], DO 01/09/19  4:21 PM

## 2019-01-10 LAB — LIPID PANEL
Cholesterol: 164 mg/dL (ref 0–200)
HDL: 73.4 mg/dL (ref >39.00)
LDL Cholesterol: 70 mg/dL (ref 0–99)
NonHDL: 90.95
Total CHOL/HDL Ratio: 2
Triglycerides: 104 mg/dL (ref 0.0–149.0)
VLDL: 20.8 mg/dL (ref 0.0–40.0)

## 2019-01-10 LAB — COMPREHENSIVE METABOLIC PANEL
ALT: 27 U/L (ref 0–35)
AST: 22 U/L (ref 0–37)
Albumin: 4.5 g/dL (ref 3.5–5.2)
Alkaline Phosphatase: 59 U/L (ref 39–117)
BILIRUBIN TOTAL: 0.4 mg/dL (ref 0.2–1.2)
BUN: 13 mg/dL (ref 6–23)
CO2: 27 meq/L (ref 19–32)
Calcium: 9.7 mg/dL (ref 8.4–10.5)
Chloride: 96 mEq/L (ref 96–112)
Creatinine, Ser: 0.56 mg/dL (ref 0.40–1.20)
GFR: 104.44 mL/min (ref >60.00)
Glucose, Bld: 132 mg/dL — ABNORMAL HIGH (ref 70–99)
Potassium: 4.1 mEq/L (ref 3.5–5.1)
Sodium: 132 mEq/L — ABNORMAL LOW (ref 135–145)
TOTAL PROTEIN: 7 g/dL (ref 6.0–8.3)

## 2019-01-19 ENCOUNTER — Telehealth: Payer: Self-pay | Admitting: Family Medicine

## 2019-01-19 NOTE — Telephone Encounter (Signed)
Definitely take as needed rather than scheduled. She can try cutting in half also. Ty.

## 2019-01-19 NOTE — Telephone Encounter (Signed)
Copied from [place] [id number]. Topic: General - Other >> Jan 19, 2019  8:52 AM Lennox Solders wrote: Reason for CRM:pt sister ann is calling. Pt saw dr Nani Ravens on 01-09-2019. Pt sister ann would like to know if patient should decrease lorazepam by cutting med in half or change the direction as needed instead of every 8 hrs. Pt did not have anxiety attack yesterday however this morning she had one. Pt does not have anymore refills.  Pt has enough med for next 6 days.  [address]

## 2019-01-19 NOTE — Telephone Encounter (Signed)
Called informed the patient of PCP instructions. She verbalized understanding. 

## 2019-01-23 ENCOUNTER — Other Ambulatory Visit: Payer: Self-pay | Admitting: Family Medicine

## 2019-01-23 DIAGNOSIS — Z634 Disappearance and death of family member: Secondary | ICD-10-CM

## 2019-01-26 ENCOUNTER — Ambulatory Visit: Payer: Medicare Other | Admitting: Family Medicine

## 2019-02-07 ENCOUNTER — Telehealth: Payer: Self-pay | Admitting: Family Medicine

## 2019-02-07 ENCOUNTER — Other Ambulatory Visit: Payer: Self-pay

## 2019-02-07 ENCOUNTER — Ambulatory Visit (INDEPENDENT_AMBULATORY_CARE_PROVIDER_SITE_OTHER): Payer: Medicare Other | Admitting: Family Medicine

## 2019-02-07 ENCOUNTER — Encounter: Payer: Self-pay | Admitting: Family Medicine

## 2019-02-07 DIAGNOSIS — Z634 Disappearance and death of family member: Secondary | ICD-10-CM | POA: Diagnosis not present

## 2019-02-07 MED ORDER — QUETIAPINE FUMARATE 25 MG PO TABS: 25.0000 mg | ORAL_TABLET | Freq: Every day | ORAL | 1 refills | Status: DC

## 2019-02-07 NOTE — Progress Notes (Signed)
Virtual Visit via Telephone Note  I connected with SANELA EVOLA on 02/07/19 at 12:45 PM EDT by telephone and verified that I am speaking with the correct person using two identifiers.   I discussed the limitations, risks, security and privacy concerns of performing an evaluation and management service by telephone and the availability of in person appointments. I also discussed with the patient that there may be a patient responsible charge related to this service. The patient expressed understanding and agreed to proceed.   History of Present Illness: F/u for bereavement. Currently on Zoloft 50 mg/d, Seroquel 25 mg qhs, Ativan 0.5 mg TID prn. Panic attacks getting better as well as nighttime awakenings, but latter still bothersome. Racing thoughts prevent her from falling back asleep. Has not scheduled appt with psychiatry. Not currently seeing counseling 2/2 Covid19 quarantining.    Observations/Objective: No conversational dyspnea Age appropriate judgment and insight Nml affect and mood- sounds better than in previous visits  Assessment and Plan: Bereavement - Plan: QUEtiapine (SEROQUEL) 25 MG tablet  Cont care as is. Needs to make appt w psych. Take 1/2 tab of Ativan when she wakes up and thoughts are racing.   Follow Up Instructions: 6 weeks.   I discussed the assessment and treatment plan with the patient. The patient was provided an opportunity to ask questions and all were answered. The patient agreed with the plan and demonstrated an understanding of the instructions.   The patient was advised to call back or seek an in-person evaluation if the symptoms worsen or if the condition fails to improve as anticipated.  I provided 12 minutes of non-face-to-face time during this encounter.   [place], DO

## 2019-02-07 NOTE — Telephone Encounter (Signed)
If she is able to prescribe medication, then yes. Otherwise, here is a list of psychiatrists:  [facility] [address] Marily Memos [place], [place] [address] [phone]  [facility] [address] [place], [place] [address] [phone]  [facility] [address] [place], [place] [address] [phone]  [facility] [address], [place], [place], [phone] [address], [place], [place], [place]  [facility] [place], [place]  [place] [facility] [place], [place], [place]  [facility] [place], [place]  Call one of these offices sooner than later as it can take 2-3 months to get a new patient appointment.

## 2019-02-07 NOTE — Telephone Encounter (Signed)
Copied from [place] [id number]. Topic: Quick Communication - See Telephone Encounter >> Feb 07, 2019  2:41 PM Vernona Rieger wrote: CRM for notification. See Telephone encounter for: 02/07/19.  Patient's sister, Lannette Donath called and said that her sister asked her to call to see if it would be alright if she was set [facility] with [facility]. Lannette Donath goes there herself. They work with a Designer, jewellery when it comes to the medicines. She states that she has lost the list of the different physiatrist that they could call. So, if this is not okay - please let her know. Please Advise.

## 2019-02-08 NOTE — Telephone Encounter (Signed)
Called informed the patients sister and she stated that they do

## 2019-02-13 NOTE — Telephone Encounter (Signed)
FAXED notes as requested/patients sister informed

## 2019-02-13 NOTE — Telephone Encounter (Signed)
Notes   Denver Faster 02/09/2019 03:05 PM  Pt sister called in and would like to know if records or office notes could be sent over to [facility].  Could this be forwarded to them without going though medical records.  Please call Lannette Donath at [phone]   Fax number [phone] Address :  [place] [address],  Attn Karolee Stamps

## 2019-02-23 ENCOUNTER — Other Ambulatory Visit: Payer: Self-pay | Admitting: Family Medicine

## 2019-02-23 DIAGNOSIS — Z634 Disappearance and death of family member: Secondary | ICD-10-CM

## 2019-03-02 ENCOUNTER — Other Ambulatory Visit: Payer: Self-pay | Admitting: Family Medicine

## 2019-03-02 DIAGNOSIS — Z634 Disappearance and death of family member: Secondary | ICD-10-CM

## 2019-03-15 ENCOUNTER — Other Ambulatory Visit: Payer: Self-pay | Admitting: Family Medicine

## 2019-03-15 ENCOUNTER — Encounter: Payer: Self-pay | Admitting: Family Medicine

## 2019-03-15 ENCOUNTER — Other Ambulatory Visit: Payer: Self-pay

## 2019-03-15 ENCOUNTER — Ambulatory Visit (INDEPENDENT_AMBULATORY_CARE_PROVIDER_SITE_OTHER): Payer: Medicare Other | Admitting: Family Medicine

## 2019-03-15 DIAGNOSIS — Z634 Disappearance and death of family member: Secondary | ICD-10-CM | POA: Diagnosis not present

## 2019-03-15 NOTE — Telephone Encounter (Signed)
Schedule appt with PCP today to discuss weaning off medication.

## 2019-03-15 NOTE — Telephone Encounter (Signed)
She is doing MUCH better--sleeping great--- no panic attacks since like 3 weeks. She does know she needs to wean herself off this She will check with the pharmacy for the refill.

## 2019-03-15 NOTE — Telephone Encounter (Signed)
She should have a refill? Also, please find out her situation with psychiatry. Ty.

## 2019-03-15 NOTE — Telephone Encounter (Signed)
Copied from [place] [id number]. Topic: Quick Communication - Rx Refill/Question >> Mar 15, 2019  9:27 AM Reyne Dumas L wrote: Medication: LORazepam (ATIVAN) 0.5 MG tablet  Has the patient contacted their pharmacy? Yes - it is going to run out in two days.  Pt states she is doing  much better on the medication. (Agent: If no, request that the patient contact the pharmacy for the refill.) (Agent: If yes, when and what did the pharmacy advise?)  Preferred Pharmacy (with phone number or street name): [facility] [address] [place] - [address] [phone] (Phone) [phone] (Fax)  Agent: Please be advised that RX refills may take up to 3 business days. We ask that you follow-up with your pharmacy.

## 2019-03-15 NOTE — Patient Instructions (Signed)
We can stop the quetiapine.   Stay on Zoloft over next 2 weeks. If you are doing well, cut the tab in half for 2 weeks and then stop.  For the lorazepam, only use when needed. If you feel good, don't take it. I would not recommend taking more than 1 every 12 hours as needed for now.  Stay active.  Let us know if you need anything.

## 2019-03-15 NOTE — Progress Notes (Signed)
Chief Complaint  Patient presents with  . Discuss weaning off medication    Subjective: Patient is a 78 y.o. female here for anxiety f/u. Due to COVID-19 pandemic, we are interacting via telephone. I verified patient's ID using 2 identifiers. Patient agreed to proceed with visit via this method. Patient is at home, I am at office. Patient and I are present for visit.   Pt had been taking Seroquel 25 mg/d, Zoloft 50 mg/d, and Ativan 0.5 mg TID prn over past several weeks after death of husband and sis in late 2019. Lately she has felt great. Interested in coming off of medication. No AE's. Has been using benzo TID rather than prn.   ROS: Psych: No recent anxiety  Past Medical History:  Diagnosis Date  . Allergic rhinitis, cause unspecified   . Aortic stenosis   . Benign neoplasm of colon   . Disorder of bone and cartilage, unspecified   . Hypercholesteremia   . Lumbago   . Malignant neoplasm of thyroid gland ([place])   . Mitral regurgitation   . Other and unspecified hyperlipidemia   . Other iatrogenic hypothyroidism   . Skin cancer    basal cell cancers  . Unspecified essential hypertension   . Unspecified transient cerebral ischemia     Objective: No conversational dyspnea Age appropriate judgment and insight Nml affect and mood  Assessment and Plan: Bereavement - Plan: LORazepam (ATIVAN) 0.5 MG tablet, sertraline (ZOLOFT) 50 MG tablet  Stop Seroquel. Cont Zoloft for now. Use Ativan q 12 hrs (from q 8 hrs) prn only. She is doing great. Will mail her instructions on how to wean down from Zoloft- in 2 weeks if still doing well, will have her take 1/2 tab for another 2 weeks then off.  Total time spent: 52m31s. The patient voiced understanding and agreement to the plan.  [place], DO 03/15/19  1:40 PM

## 2019-03-17 ENCOUNTER — Other Ambulatory Visit: Payer: Self-pay | Admitting: Family Medicine

## 2019-03-17 DIAGNOSIS — Z634 Disappearance and death of family member: Secondary | ICD-10-CM

## 2019-03-17 MED ORDER — LORAZEPAM 0.5 MG PO TABS: 0.5000 mg | ORAL_TABLET | Freq: Two times a day (BID) | ORAL | 1 refills | Status: DC | PRN

## 2019-03-17 NOTE — Telephone Encounter (Signed)
°  Pt called regarding prescription and would like an update. Please advise.

## 2019-03-17 NOTE — Addendum Note (Signed)
Addended by: Ames Coupe on: 03/17/2019 12:42 PM   Modules accepted: Orders

## 2019-03-17 NOTE — Telephone Encounter (Signed)
She doesn't have a refill?

## 2019-03-17 NOTE — Telephone Encounter (Signed)
Spoke to her and she said she does not per pharmacy message The bottle she is using currently has the date 03/06/2019 on it and has 3 left---she has cut back to 2 and is doing well.

## 2019-03-17 NOTE — Telephone Encounter (Signed)
Called left message to call back 

## 2019-04-04 DIAGNOSIS — L821 Other seborrheic keratosis: Secondary | ICD-10-CM | POA: Diagnosis not present

## 2019-04-04 DIAGNOSIS — L218 Other seborrheic dermatitis: Secondary | ICD-10-CM | POA: Diagnosis not present

## 2019-04-04 DIAGNOSIS — D485 Neoplasm of uncertain behavior of skin: Secondary | ICD-10-CM | POA: Diagnosis not present

## 2019-04-18 ENCOUNTER — Telehealth: Payer: Self-pay | Admitting: Family Medicine

## 2019-04-18 ENCOUNTER — Other Ambulatory Visit: Payer: Self-pay | Admitting: Family Medicine

## 2019-04-18 DIAGNOSIS — Z634 Disappearance and death of family member: Secondary | ICD-10-CM

## 2019-04-18 MED ORDER — SERTRALINE HCL 50 MG PO TABS: 50.0000 mg | ORAL_TABLET | Freq: Every day | ORAL | 3 refills | Status: DC

## 2019-04-18 MED ORDER — LORAZEPAM 0.5 MG PO TABS: 0.2500 mg | ORAL_TABLET | Freq: Two times a day (BID) | ORAL | 0 refills | Status: DC | PRN

## 2019-04-18 NOTE — Telephone Encounter (Signed)
Lorazepam - pt is only taking 1 pill per 24 hours - everything is going fine  Sertraline - pt is taking 1/2 pill per day and has been for 2 weeks - should you stop taking the sertraline completely to wean off?  Please call back.

## 2019-04-18 NOTE — Telephone Encounter (Signed)
Copied from [place] [phone]. Topic: General - Other >> Apr 17, 2019  9:41 AM Carolyn Stare wrote: Pt would like a call back her medication from  [facility]     LORazepam (ATIVAN) 0.5 MG tablet   sertraline (ZOLOFT) 50 MG tablet   Zoloft done and will send ativan to PCP

## 2019-04-18 NOTE — Telephone Encounter (Signed)
Last refill on 03/17/2019   #30 with 1 refill

## 2019-04-19 NOTE — Telephone Encounter (Signed)
Have her take 1/2 tab of Ativan daily and then stop. She can stop sertraline. Ty.

## 2019-04-19 NOTE — Telephone Encounter (Signed)
She can take sertraline if she needs, but it may not be as helpful if used on an as needed basis. She can take 1/2 tab daily for the next 2 weeks and then stop for the Ativan.

## 2019-04-19 NOTE — Telephone Encounter (Signed)
The patient wanted to know how many days to take the 1/2 daily ativan (she has around 17 left in her bottle) Also she will stop the sertraline, but had one question if she has a bad day can she just take one?

## 2019-04-20 NOTE — Telephone Encounter (Signed)
Called informed the patient of PCP instructions. She verbalized understanding/had no other questions and will call back to schedule appt in september

## 2019-04-20 NOTE — Telephone Encounter (Signed)
Once [name] is doing well, I want to see her every 6 months. Maybe schedule an appt in 3 mo and we will see each other twice yearly after that unless she needs [place]. Ty.

## 2019-04-20 NOTE — Telephone Encounter (Signed)
Patient informed of PCP instructions--she wanted PCP to know she is doing to well and getting exercise by working outside. She would like to get on a schedule with you now for follow ups etc., when would you like for her to return for that?

## 2019-04-26 DIAGNOSIS — H5319 Other subjective visual disturbances: Secondary | ICD-10-CM | POA: Diagnosis not present

## 2019-04-26 DIAGNOSIS — Z961 Presence of intraocular lens: Secondary | ICD-10-CM | POA: Diagnosis not present

## 2019-05-24 ENCOUNTER — Ambulatory Visit (HOSPITAL_COMMUNITY): Payer: Medicare Other | Attending: Cardiovascular Disease

## 2019-05-24 ENCOUNTER — Other Ambulatory Visit: Payer: Self-pay

## 2019-05-24 DIAGNOSIS — I350 Nonrheumatic aortic (valve) stenosis: Secondary | ICD-10-CM | POA: Insufficient documentation

## 2019-06-01 ENCOUNTER — Telehealth: Payer: Self-pay | Admitting: *Deleted

## 2019-06-01 DIAGNOSIS — Z1231 Encounter for screening mammogram for malignant neoplasm of breast: Secondary | ICD-10-CM | POA: Diagnosis not present

## 2019-06-01 DIAGNOSIS — Z853 Personal history of malignant neoplasm of breast: Secondary | ICD-10-CM | POA: Diagnosis not present

## 2019-06-01 LAB — HM MAMMOGRAPHY

## 2019-06-01 NOTE — Telephone Encounter (Signed)
Left message to call back  

## 2019-06-01 NOTE — Telephone Encounter (Signed)
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  No change in therapy.   No further imaging.  Call Ms. Notaro with the results and send results to Shelda Pal, DO

## 2019-06-01 NOTE — Telephone Encounter (Signed)
Advised patient of results.  

## 2019-06-05 ENCOUNTER — Encounter: Payer: Self-pay | Admitting: Family Medicine

## 2019-06-15 DIAGNOSIS — C44311 Basal cell carcinoma of skin of nose: Secondary | ICD-10-CM | POA: Diagnosis not present

## 2019-06-15 DIAGNOSIS — D485 Neoplasm of uncertain behavior of skin: Secondary | ICD-10-CM | POA: Diagnosis not present

## 2019-06-15 DIAGNOSIS — L723 Sebaceous cyst: Secondary | ICD-10-CM | POA: Diagnosis not present

## 2019-06-15 DIAGNOSIS — C44319 Basal cell carcinoma of skin of other parts of face: Secondary | ICD-10-CM | POA: Diagnosis not present

## 2019-07-02 DIAGNOSIS — I351 Nonrheumatic aortic (valve) insufficiency: Secondary | ICD-10-CM | POA: Insufficient documentation

## 2019-07-02 DIAGNOSIS — I350 Nonrheumatic aortic (valve) stenosis: Secondary | ICD-10-CM | POA: Insufficient documentation

## 2019-07-02 DIAGNOSIS — E785 Hyperlipidemia, unspecified: Secondary | ICD-10-CM | POA: Insufficient documentation

## 2019-07-02 NOTE — Progress Notes (Signed)
Cardiology Office Note   Date:  07/03/2019   ID:  Katherine, Bush [birth year], MRN [id number]  PCP:  Katherine Pal, DO  Cardiologist:   Minus Breeding, MD   Chief Complaint  Patient presents with  . AS/AI     History of Present Illness: [name] is a 78 y.o. female for follow up of mild AS and AI.  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  Since I last saw her and her husband and 2 sisters and a brother died.  She is naturally a little stressed and grieving with this.  She now seems to be doing better she says emotionally.  She is back to being very active.  She is walking routinely.  She denies any cardiovascular symptoms. The patient denies any new symptoms such as chest discomfort, neck or arm discomfort. There has been no new shortness of breath, PND or orthopnea. There have been no reported palpitations, presyncope or syncope.   She did have a follow-up echocardiogram.  She continues to have mild left ear and mild AI and normal left ventricular dysfunction.  This is unchanged from last year.  Past Medical History:  Diagnosis Date  . Allergic rhinitis, cause unspecified   . Aortic stenosis   . Benign neoplasm of colon   . Disorder of bone and cartilage, unspecified   . Hypercholesteremia   . Lumbago   . Malignant neoplasm of thyroid gland ([place])   . Mitral regurgitation   . Other and unspecified hyperlipidemia   . Other iatrogenic hypothyroidism   . Skin cancer    basal cell cancers  . Unspecified essential hypertension   . Unspecified transient cerebral ischemia     Past Surgical History:  Procedure Laterality Date  . basal cell carcinoma removed from inside the left ear  11/04/2012   june 2015  . COLONOSCOPY    . DILATION AND CURETTAGE OF UTERUS    . EYE SURGERY  08/22/2014  . POLYPECTOMY    . TOTAL THYROIDECTOMY       Current Outpatient Medications  Medication Sig Dispense Refill  . amLODipine (NORVASC)  10 MG tablet Take 1 tablet (10 mg total) by mouth daily. 90 tablet 2  . Calcium Carbonate-Vitamin D (CALCIUM 600+D) 600-400 MG-UNIT per tablet Take 1 tablet by mouth daily.     . cetirizine (ZYRTEC) 5 MG tablet Take 5 mg by mouth as needed.      . cholecalciferol (VITAMIN D) 1000 UNITS tablet Take 1,000 Units by mouth daily.    [name] dicyclomine (BENTYL) 20 MG tablet Take 1 tablet (20 mg total) by mouth 2 (two) times daily as needed (abdominal cramping). 60 tablet 5  . dipyridamole-aspirin (AGGRENOX) 200-25 MG 12hr capsule Take 1 capsule by mouth 2 (two) times daily. 180 capsule 3  . fluocinonide (LIDEX) 0.05 % external solution Apply 1 application topically 2 (two) times daily.    . furosemide (LASIX) 20 MG tablet Take 1/2 daily as needed for swelling 60 tablet 5  . Lactobacillus (PROBIOTIC ACIDOPHILUS PO) Take by mouth daily. Reported on 12/03/2015    . levothyroxine (SYNTHROID, LEVOTHROID) 100 MCG tablet Take 88 mcg and 100 mcg alternating days.    [name] LORazepam (ATIVAN) 0.5 MG tablet Take 0.5 tablets (0.25 mg total) by mouth every 12 (twelve) hours as needed for anxiety. for anxiety 30 tablet 0  . metoprolol succinate (TOPROL-XL) 50 MG 24 hr tablet TAKE 1 TABLET BY MOUTH EVERY DAY  TAKE WITH OR IMMEDIATELY FOLLOWING A MEAL 90 tablet 3  . Multiple Vitamins-Minerals (CENTRUM SILVER PO) Take 1 tablet by mouth daily.      . sertraline (ZOLOFT) 50 MG tablet Take 1 tablet (50 mg total) by mouth daily. 30 tablet 3  . simvastatin (ZOCOR) 20 MG tablet Take 1 tablet (20 mg total) by mouth at bedtime. 90 tablet 3  . vitamin E 400 UNIT capsule Take 400 Units by mouth daily.     No current facility-administered medications for this visit.     Allergies:   Sulfonamide derivatives    ROS:  Please see the history of present illness.   Otherwise, review of systems are positive for none.   All other systems are reviewed and negative.    PHYSICAL EXAM: VS:  BP (!) 148/70   Pulse 77   Temp (!) 97.3 F (36.3 C)  (Temporal)   Ht 5\' 1"  (1.549 m)   Wt 121 lb 3.2 oz (55 kg)   SpO2 96%   BMI 22.90 kg/m  , BMI Body mass index is 22.9 kg/m. GENERAL:  Well appearing NECK:  No jugular venous distention, waveform within normal limits, carotid upstroke brisk and symmetric, no bruits, no thyromegaly LUNGS:  Clear to auscultation bilaterally CHEST:  Unremarkable HEART:  PMI not displaced or sustained,S1 and S2 within normal limits, no S3, no S4, no clicks, no rubs, 3 out of 6 apical systolic murmur radiating out aortic outflow tract, no diastolic murmurs ABD:  Flat, positive bowel sounds normal in frequency in pitch, no bruits, no rebound, no guarding, no midline pulsatile mass, no hepatomegaly, no splenomegaly EXT:  2 plus pulse   EKG:  EKG is ordered today. The ekg ordered today demonstrates normal sinus rhythm, rate 77. Normal axis, intervals.    Recent Labs: 01/09/2019: ALT 27; BUN 13; Creatinine, Ser 0.56; Potassium 4.1; Sodium 132    Lipid Panel    Component Value Date/Time   CHOL 164 01/09/2019 1553   TRIG 104.0 01/09/2019 1553   HDL 73.40 01/09/2019 1553   CHOLHDL 2 01/09/2019 1553   VLDL 20.8 01/09/2019 1553   LDLCALC 70 01/09/2019 1553   LDLDIRECT 200.1 02/29/2008 1026      Wt Readings from Last 3 Encounters:  07/03/19 121 lb 3.2 oz (55 kg)  01/09/19 123 lb (55.8 kg)  01/04/19 121 lb 2 oz (54.9 kg)      Other studies Reviewed: Additional studies/ records that were reviewed today include: echocardiogram Review of the above records demonstrates: 0-39% bilateral ICA stenosis. Please see elsewhere in the note.    ASSESSMENT AND PLAN:  AS: this was mild on echo in July.  I listen to this again next year if not we will need to order an echo.  The gradient was 12 in 2019 mean and now is 13.  I did review the echo for this appointment.  AI:  Mild on echo in July.  As above.  HTN:   BP is mildly elevated but this is very unusual.  No change in therapy is indicated.   HLD:  LDL was  70.  She remain on the meds as listed.   THYROID CANCER:   She is up-to-date with thyroid studies and replacement and follow-up.  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  Congenital abnormality as above.  She is followed by a neurologist 3 years with this and I will make no change.   Current medicines are reviewed  at length with the patient today.  The patient doe snot have concerns regarding medicines.  The following changes have been made:  None  Labs/ tests ordered today include: None   Orders Placed This Encounter  Procedures  . EKG 12-Lead     Disposition:   FU with me in one year.    Signed, Minus Breeding, MD  07/03/2019 3:44 PM     [facility]

## 2019-07-03 ENCOUNTER — Encounter: Payer: Self-pay | Admitting: Cardiology

## 2019-07-03 ENCOUNTER — Ambulatory Visit (INDEPENDENT_AMBULATORY_CARE_PROVIDER_SITE_OTHER): Payer: Medicare Other | Admitting: Cardiology

## 2019-07-03 ENCOUNTER — Other Ambulatory Visit: Payer: Self-pay

## 2019-07-03 VITALS — BP 148/70 | HR 77 | Temp 97.3°F | Ht 61.0 in | Wt 121.2 lb

## 2019-07-03 DIAGNOSIS — E785 Hyperlipidemia, unspecified: Secondary | ICD-10-CM | POA: Diagnosis not present

## 2019-07-03 DIAGNOSIS — I350 Nonrheumatic aortic (valve) stenosis: Secondary | ICD-10-CM | POA: Diagnosis not present

## 2019-07-03 DIAGNOSIS — I10 Essential (primary) hypertension: Secondary | ICD-10-CM | POA: Diagnosis not present

## 2019-07-03 DIAGNOSIS — I351 Nonrheumatic aortic (valve) insufficiency: Secondary | ICD-10-CM

## 2019-07-03 NOTE — Patient Instructions (Signed)

## 2019-07-04 ENCOUNTER — Ambulatory Visit: Payer: Medicare Other | Admitting: Cardiology

## 2019-07-07 ENCOUNTER — Telehealth (INDEPENDENT_AMBULATORY_CARE_PROVIDER_SITE_OTHER): Payer: Medicare Other | Admitting: Family Medicine

## 2019-07-07 ENCOUNTER — Other Ambulatory Visit: Payer: Self-pay

## 2019-07-07 DIAGNOSIS — Z7189 Other specified counseling: Secondary | ICD-10-CM

## 2019-07-07 DIAGNOSIS — Z09 Encounter for follow-up examination after completed treatment for conditions other than malignant neoplasm: Secondary | ICD-10-CM | POA: Diagnosis not present

## 2019-07-07 DIAGNOSIS — Z7185 Encounter for immunization safety counseling: Secondary | ICD-10-CM

## 2019-07-07 NOTE — Progress Notes (Signed)
No chief complaint on file.   Subjective: Patient is a 78 y.o. female here for f/u bereavement.Due to COVID-19 pandemic, we are interacting via web portal for an electronic face-to-face visit. I verified patient's ID using 2 identifiers. Patient agreed to proceed with visit via this method. Patient is at home, I am at office. Patient and I are present for visit.   Pt had been tx'd for bereavement. Feeling much better. Has taken herself off of both the Ativan and the Zoloft 2 mo ago. Going outside, taking care of ADL's, feels very good. No HI or SI.  Wondering when she should get her flu shot, has appt next week.   ROS: Psych: No SI or HI  Past Medical History:  Diagnosis Date  . Allergic rhinitis, cause unspecified   . Aortic stenosis   . Benign neoplasm of colon   . Disorder of bone and cartilage, unspecified   . Hypercholesteremia   . Lumbago   . Malignant neoplasm of thyroid gland ([place])   . Mitral regurgitation   . Other and unspecified hyperlipidemia   . Other iatrogenic hypothyroidism   . Skin cancer    basal cell cancers  . Unspecified essential hypertension   . Unspecified transient cerebral ischemia     Objective: No conversational dyspnea Age appropriate judgment and insight Nml affect and mood   Assessment and Plan: Follow-up for resolved condition  Immunization counseling  She sounds great. Meds removed from list. Stay active. Flu shot rec'd for mid Oct. F/u in 6 mo otherwise or prn.  Total time spent: 10:13 The patient voiced understanding and agreement to the plan.  [place], DO 07/07/19  1:07 PM

## 2019-07-11 ENCOUNTER — Ambulatory Visit: Payer: Medicare Other

## 2019-08-07 DIAGNOSIS — E890 Postprocedural hypothyroidism: Secondary | ICD-10-CM | POA: Diagnosis not present

## 2019-08-07 DIAGNOSIS — Z85850 Personal history of malignant neoplasm of thyroid: Secondary | ICD-10-CM | POA: Diagnosis not present

## 2019-08-07 DIAGNOSIS — R7309 Other abnormal glucose: Secondary | ICD-10-CM | POA: Diagnosis not present

## 2019-08-07 DIAGNOSIS — Z833 Family history of diabetes mellitus: Secondary | ICD-10-CM | POA: Diagnosis not present

## 2019-08-18 ENCOUNTER — Other Ambulatory Visit: Payer: Self-pay

## 2019-08-18 ENCOUNTER — Ambulatory Visit (INDEPENDENT_AMBULATORY_CARE_PROVIDER_SITE_OTHER): Payer: Medicare Other | Admitting: *Deleted

## 2019-08-18 DIAGNOSIS — Z23 Encounter for immunization: Secondary | ICD-10-CM | POA: Diagnosis not present

## 2019-08-18 NOTE — Progress Notes (Signed)
Patient here for flu vaccine.  Vaccine given in left deltoid and patient tolerated well. 

## 2019-08-22 DIAGNOSIS — D2239 Melanocytic nevi of other parts of face: Secondary | ICD-10-CM | POA: Diagnosis not present

## 2019-08-22 DIAGNOSIS — C44311 Basal cell carcinoma of skin of nose: Secondary | ICD-10-CM | POA: Diagnosis not present

## 2019-08-22 DIAGNOSIS — D485 Neoplasm of uncertain behavior of skin: Secondary | ICD-10-CM | POA: Diagnosis not present

## 2019-08-29 DIAGNOSIS — C44311 Basal cell carcinoma of skin of nose: Secondary | ICD-10-CM | POA: Diagnosis not present

## 2019-08-29 DIAGNOSIS — L570 Actinic keratosis: Secondary | ICD-10-CM | POA: Diagnosis not present

## 2019-10-03 DIAGNOSIS — Z85828 Personal history of other malignant neoplasm of skin: Secondary | ICD-10-CM | POA: Diagnosis not present

## 2019-10-03 DIAGNOSIS — L814 Other melanin hyperpigmentation: Secondary | ICD-10-CM | POA: Diagnosis not present

## 2019-10-03 DIAGNOSIS — L218 Other seborrheic dermatitis: Secondary | ICD-10-CM | POA: Diagnosis not present

## 2019-10-03 DIAGNOSIS — D225 Melanocytic nevi of trunk: Secondary | ICD-10-CM | POA: Diagnosis not present

## 2019-10-03 DIAGNOSIS — L821 Other seborrheic keratosis: Secondary | ICD-10-CM | POA: Diagnosis not present

## 2019-10-03 DIAGNOSIS — D1801 Hemangioma of skin and subcutaneous tissue: Secondary | ICD-10-CM | POA: Diagnosis not present

## 2019-11-13 NOTE — Progress Notes (Signed)
Virtual Visit via Video Note  I connected with patient on 11/14/19 at 10:15 AM EST by audio enabled telemedicine application and verified that I am speaking with the correct person using two identifiers.   THIS ENCOUNTER IS A VIRTUAL VISIT DUE TO COVID-19 - PATIENT WAS NOT SEEN IN THE OFFICE. PATIENT HAS CONSENTED TO VIRTUAL VISIT / TELEMEDICINE VISIT   Location of patient: home  Location of provider: office  I discussed the limitations of evaluation and management by telemedicine and the availability of in person appointments. The patient expressed understanding and agreed to proceed.   Subjective:   [name] is a 79 y.o. female who presents for an Initial Medicare Annual Wellness Visit.  The Patient was informed that the wellness visit is to identify future health risk and educate and initiate measures that can reduce risk for increased disease through the lifespan.   Describes health as fair, good or great? Good!  Pt enjoys doing crosswords and reading scripture daily.   Review of Systems    Home Safety/Smoke Alarms: Feels safe in home. Smoke alarms in place.  Lives alone in 1 story home. Walk-in shower w/ seat and grab rail. Sister lives next door. Verbalizes a great support system with family and church.    Female:        Mammo- 06/01/19       Dexa scan-  01/15/16      CCS- 04/12/17. No longer doing routine screening due to age.   Objective:    Today's Vitals   11/14/19 0957  BP: 140/66  Pulse: 74  Weight: 124 lb (56.2 kg)   Body mass index is 23.43 kg/m.  Advanced Directives 11/14/2019  Does Patient Have a Medical Advance Directive? Yes  Type of Paramedic of [place];Living will  Does patient want to make changes to medical advance directive? No - Patient declined  Copy of [place] in Chart? Yes - validated most recent copy scanned in chart (See row information)    Current Medications (verified) Outpatient  Encounter Medications as of 11/14/2019  Medication Sig  . amLODipine (NORVASC) 10 MG tablet Take 1 tablet (10 mg total) by mouth daily.  . Calcium Carbonate-Vitamin D (CALCIUM 600+D) 600-400 MG-UNIT per tablet Take 1 tablet by mouth daily.   . cetirizine (ZYRTEC) 5 MG tablet Take 5 mg by mouth as needed.    . cholecalciferol (VITAMIN D) 1000 UNITS tablet Take 1,000 Units by mouth daily.  [name] dipyridamole-aspirin (AGGRENOX) 200-25 MG 12hr capsule Take 1 capsule by mouth 2 (two) times daily.  . fluocinonide (LIDEX) 0.05 % external solution Apply 1 application topically 2 (two) times daily.  . furosemide (LASIX) 20 MG tablet Take 1/2 daily as needed for swelling  . Lactobacillus (PROBIOTIC ACIDOPHILUS PO) Take by mouth daily. Reported on 12/03/2015  . levothyroxine (SYNTHROID, LEVOTHROID) 100 MCG tablet Take 88 mcg and 100 mcg alternating days.  . metoprolol succinate (TOPROL-XL) 50 MG 24 hr tablet TAKE 1 TABLET BY MOUTH EVERY DAY TAKE WITH OR IMMEDIATELY FOLLOWING A MEAL  . Multiple Vitamins-Minerals (CENTRUM SILVER PO) Take 1 tablet by mouth daily.    . simvastatin (ZOCOR) 20 MG tablet Take 1 tablet (20 mg total) by mouth at bedtime.  . vitamin E 400 UNIT capsule Take 400 Units by mouth daily.   No facility-administered encounter medications on file as of 11/14/2019.    Allergies (verified) Sulfonamide derivatives   History: Past Medical History:  Diagnosis Date  . Allergic  rhinitis, cause unspecified   . Aortic stenosis   . Benign neoplasm of colon   . Disorder of bone and cartilage, unspecified   . Hypercholesteremia   . Lumbago   . Malignant neoplasm of thyroid gland ([place])   . Mitral regurgitation   . Other and unspecified hyperlipidemia   . Other iatrogenic hypothyroidism   . Skin cancer    basal cell cancers  . Unspecified essential hypertension   . Unspecified transient cerebral ischemia    Past Surgical History:  Procedure Laterality Date  . basal cell carcinoma removed  from inside the left ear  11/04/2012   june 2015  . COLONOSCOPY    . DILATION AND CURETTAGE OF UTERUS    . EYE SURGERY  08/22/2014  . POLYPECTOMY    . TOTAL THYROIDECTOMY     Family History  Problem Relation Age of Onset  . Diabetes Mother   . Congestive Heart Failure Mother   . Heart disease Mother   . Hypertension Mother   . Kidney disease Mother   . Hypertension Father   . CVA Father 28  . Early death Father   . Stroke Father   . CAD Brother        stents placed, CABG  . Cancer Brother   . Diabetes Brother   . Hyperlipidemia Brother   . Hypertension Brother   . Prostate cancer Brother   . Cancer Brother   . Diabetes Brother   . Early death Brother   . Hypertension Brother   . Hyperlipidemia Brother   . Lung cancer Brother   . Cancer Brother   . Early death Brother   . Hyperlipidemia Brother   . Hypertension Brother   . Bone cancer Brother   . Colon polyps Sister        x 3   . Diabetes Sister   . Colon cancer Neg Hx   . Esophageal cancer Neg Hx   . Stomach cancer Neg Hx    Social History   Socioeconomic History  . Marital status: Married    Spouse name: Trezure Anding  . Number of children: 0  . Years of education: Not on file  . Highest education level: Not on file  Occupational History  . Occupation: Retired    Comment: data entry from [employer]  . Smoking status: Never Smoker  . Smokeless tobacco: Never Used  Substance and Sexual Activity  . Alcohol use: No    Alcohol/week: 0.0 standard drinks  . Drug use: No  . Sexual activity: Not on file  Other Topics Concern  . Not on file  Social History Narrative   Widow   Social Determinants of Health   Financial Resource Strain:   . Difficulty of Paying Living Expenses: Not on file  Food Insecurity:   . Worried About Charity fundraiser in the Last Year: Not on file  . Ran Out of Food in the Last Year: Not on file  Transportation Needs:   . Lack of Transportation (Medical): Not on  file  . Lack of Transportation (Non-Medical): Not on file  Physical Activity:   . Days of Exercise per Week: Not on file  . Minutes of Exercise per Session: Not on file  Stress:   . Feeling of Stress : Not on file  Social Connections:   . Frequency of Communication with Friends and Family: Not on file  . Frequency of Social Gatherings with Friends and Family: Not on file  .  Attends Religious Services: Not on file  . Active Member of Clubs or Organizations: Not on file  . Attends Archivist Meetings: Not on file  . Marital Status: Not on file    Tobacco Counseling Counseling given: Not Answered   Clinical Intake: Pain : No/denies pain   Activities of Daily Living In your present state of health, do you have any difficulty performing the following activities: 11/14/2019  Hearing? N  Vision? N  Difficulty concentrating or making decisions? N  Walking or climbing stairs? N  Dressing or bathing? N  Doing errands, shopping? N  Preparing Food and eating ? N  Using the Toilet? N  In the past six months, have you accidently leaked urine? N  Do you have problems with loss of bowel control? N  Managing your Medications? N  Managing your Finances? N  Housekeeping or managing your Housekeeping? N  Some recent data might be hidden     Immunizations and Health Maintenance Immunization History  Administered Date(s) Administered  . Fluad Quad(high Dose 65+) 08/18/2019  . H1N1 10/09/2008  . Influenza Split 07/20/2011, 07/14/2012  . Influenza Whole 07/25/2008, 08/09/2009, 07/25/2010  . Influenza, High Dose Seasonal PF 08/21/2016, 08/20/2017, 07/20/2018  . Influenza,inj,Quad PF,6+ Mos 07/26/2013, 07/30/2014, 07/30/2015  . Pneumococcal Conjugate-13 05/24/2014  . Pneumococcal Polysaccharide-23 11/02/2006  . Td 02/27/2009  . Zoster 11/02/2010  . Zoster Recombinat (Shingrix) 09/01/2017   Health Maintenance Due  Topic Date Due  . TETANUS/TDAP  02/28/2019    Patient Care  Team: Shelda Pal, DO as PCP - General (Family Medicine) Minus Breeding, MD as PCP - Cardiology (Cardiology) Delrae Rend, MD as Consulting Physician (Endocrinology) Rutherford Guys, MD as Consulting Physician (Ophthalmology) Minus Breeding, MD as Consulting Physician (Cardiology)  Indicate any recent Medical Services you may have received from other than [facility] providers in the past year (date may be approximate).     Assessment:   This is a routine wellness examination for Marvin. Physical assessment deferred to PCP.  Hearing/Vision screen Unable to assess. This visit is enabled though telemedicine due to Covid 19.  Dietary issues and exercise activities discussed: Current Exercise Habits: Home exercise routine, Type of exercise: walking;treadmill, Time (Minutes): 30, Frequency (Times/Week): 3, Weekly Exercise (Minutes/Week): 90, Exercise limited by: None identified Diet (meal preparation, eat out, water intake, caffeinated beverages, dairy products, fruits and vegetables): well balanced   Goals    . Continue walking and reading.      Depression Screen PHQ 2/9 Scores 11/14/2019 05/24/2014 05/11/2013  PHQ - 2 Score 0 0 0    Fall Risk Fall Risk  11/14/2019 05/24/2014 05/11/2013  Falls in the past year? 1 No No  Number falls in past yr: 0 - -  Injury with Fall? 0 - -  Follow up Education provided;Falls prevention discussed - -    Cognitive Function:      6CIT Screen 11/14/2019  What Year? 0 points  What month? 0 points  What time? 0 points  Count back from 20 0 points  Months in reverse 0 points  Repeat phrase 2 points  Total Score 2    Screening Tests Health Maintenance  Topic Date Due  . TETANUS/TDAP  02/28/2019  . INFLUENZA VACCINE  Completed  . DEXA SCAN  Completed  . PNA vac Low Risk Adult  Completed      Plan:   See you next year!  Continue to eat heart healthy diet (full of fruits, vegetables, whole grains, lean protein, water--limit  salt, fat,  and sugar intake) and increase physical activity as tolerated.  Continue doing brain stimulating activities (puzzles, reading, adult coloring books, staying active) to keep memory sharp.    I have personally reviewed and noted the following in the patient's chart:   . Medical and social history . Use of alcohol, tobacco or illicit drugs  . Current medications and supplements . Functional ability and status . Nutritional status . Physical activity . Advanced directives . List of other physicians . Hospitalizations, surgeries, and ER visits in previous 12 months . Vitals . Screenings to include cognitive, depression, and falls . Referrals and appointments  In addition, I have reviewed and discussed with patient certain preventive protocols, quality metrics, and best practice recommendations. A written personalized care plan for preventive services as well as general preventive health recommendations were provided to patient.     Shela Nevin, [place]   11/14/2019

## 2019-11-14 ENCOUNTER — Ambulatory Visit (INDEPENDENT_AMBULATORY_CARE_PROVIDER_SITE_OTHER): Payer: Medicare Other | Admitting: *Deleted

## 2019-11-14 ENCOUNTER — Other Ambulatory Visit: Payer: Self-pay

## 2019-11-14 ENCOUNTER — Encounter: Payer: Self-pay | Admitting: *Deleted

## 2019-11-14 VITALS — BP 140/66 | HR 74 | Wt 124.0 lb

## 2019-11-14 DIAGNOSIS — Z0000 Encounter for general adult medical examination without abnormal findings: Secondary | ICD-10-CM

## 2019-11-14 NOTE — Patient Instructions (Signed)
See you next year!  Continue to eat heart healthy diet (full of fruits, vegetables, whole grains, lean protein, water--limit salt, fat, and sugar intake) and increase physical activity as tolerated.  Continue doing brain stimulating activities (puzzles, reading, adult coloring books, staying active) to keep memory sharp.    [name] , Thank you for taking time to come for your Medicare Wellness Visit. I appreciate your ongoing commitment to your health goals. Please review the following plan we discussed and let me know if I can assist you in the future.   These are the goals we discussed: Goals    . Continue walking and reading.       This is a list of the screening recommended for you and due dates:  Health Maintenance  Topic Date Due  . Tetanus Vaccine  02/28/2019  . Flu Shot  Completed  . DEXA scan (bone density measurement)  Completed  . Pneumonia vaccines  Completed    Preventive Care 50 Years and Older, Female Preventive care refers to lifestyle choices and visits with your health care provider that can promote health and wellness. This includes:  A yearly physical exam. This is also called an annual well check.  Regular dental and eye exams.  Immunizations.  Screening for certain conditions.  Healthy lifestyle choices, such as diet and exercise. What can I expect for my preventive care visit? Physical exam Your health care provider will check:  Height and weight. These may be used to calculate body mass index (BMI), which is a measurement that tells if you are at a healthy weight.  Heart rate and blood pressure.  Your skin for abnormal spots. Counseling Your health care provider may ask you questions about:  Alcohol, tobacco, and drug use.  Emotional well-being.  Home and relationship well-being.  Sexual activity.  Eating habits.  History of falls.  Memory and ability to understand (cognition).  Work and work Statistician.  Pregnancy and  menstrual history. What immunizations do I need?  Influenza (flu) vaccine  This is recommended every year. Tetanus, diphtheria, and pertussis (Tdap) vaccine  You may need a Td booster every 10 years. Varicella (chickenpox) vaccine  You may need this vaccine if you have not already been vaccinated. Zoster (shingles) vaccine  You may need this after age 56. Pneumococcal conjugate (PCV13) vaccine  One dose is recommended after age 79. Pneumococcal polysaccharide (PPSV23) vaccine  One dose is recommended after age 32. Measles, mumps, and rubella (MMR) vaccine  You may need at least one dose of MMR if you were born in 1957 or later. You may also need a second dose. Meningococcal conjugate (MenACWY) vaccine  You may need this if you have certain conditions. Hepatitis A vaccine  You may need this if you have certain conditions or if you travel or work in places where you may be exposed to hepatitis A. Hepatitis B vaccine  You may need this if you have certain conditions or if you travel or work in places where you may be exposed to hepatitis B. Haemophilus influenzae type b (Hib) vaccine  You may need this if you have certain conditions. You may receive vaccines as individual doses or as more than one vaccine together in one shot (combination vaccines). Talk with your health care provider about the risks and benefits of combination vaccines. What tests do I need? Blood tests  Lipid and cholesterol levels. These may be checked every 5 years, or more frequently depending on your overall health.  Hepatitis C test.  Hepatitis B test. Screening  Lung cancer screening. You may have this screening every year starting at age 60 if you have a 30-pack-year history of smoking and currently smoke or have quit within the past 15 years.  Colorectal cancer screening. All adults should have this screening starting at age 60 and continuing until age 20. Your health care provider may  recommend screening at age 79 if you are at increased risk. You will have tests every 1-10 years, depending on your results and the type of screening test.  Diabetes screening. This is done by checking your blood sugar (glucose) after you have not eaten for a while (fasting). You may have this done every 1-3 years.  Mammogram. This may be done every 1-2 years. Talk with your health care provider about how often you should have regular mammograms.  BRCA-related cancer screening. This may be done if you have a family history of breast, ovarian, tubal, or peritoneal cancers. Other tests  Sexually transmitted disease (STD) testing.  Bone density scan. This is done to screen for osteoporosis. You may have this done starting at age 44. Follow these instructions at home: Eating and drinking  Eat a diet that includes fresh fruits and vegetables, whole grains, lean protein, and low-fat dairy products. Limit your intake of foods with high amounts of sugar, saturated fats, and salt.  Take vitamin and mineral supplements as recommended by your health care provider.  Do not drink alcohol if your health care provider tells you not to drink.  If you drink alcohol: ? Limit how much you have to 0-1 drink a day. ? Be aware of how much alcohol is in your drink. In the [place] one drink equals one 12 oz bottle of beer (355 mL), one 5 oz glass of wine (148 mL), or one 1 oz glass of hard liquor (44 mL). Lifestyle  Take daily care of your teeth and gums.  Stay active. Exercise for at least 30 minutes on 5 or more days each week.  Do not use any products that contain nicotine or tobacco, such as cigarettes, e-cigarettes, and chewing tobacco. If you need help quitting, ask your health care provider.  If you are sexually active, practice safe sex. Use a condom or other form of protection in order to prevent STIs (sexually transmitted infections).  Talk with your health care provider about taking a low-dose  aspirin or statin. What's next?  Go to your health care provider once a year for a well check visit.  Ask your health care provider how often you should have your eyes and teeth checked.  Stay up to date on all vaccines. This information is not intended to replace advice given to you by your health care provider. Make sure you discuss any questions you have with your health care provider. Document Revised: 10/13/2018 Document Reviewed: 10/13/2018 Elsevier Patient Education  2020 [employer].

## 2020-01-04 ENCOUNTER — Other Ambulatory Visit: Payer: Self-pay

## 2020-01-05 ENCOUNTER — Other Ambulatory Visit: Payer: Self-pay

## 2020-01-05 ENCOUNTER — Encounter: Payer: Self-pay | Admitting: Family Medicine

## 2020-01-05 ENCOUNTER — Ambulatory Visit: Payer: Medicare Other | Admitting: Family Medicine

## 2020-01-05 ENCOUNTER — Ambulatory Visit (INDEPENDENT_AMBULATORY_CARE_PROVIDER_SITE_OTHER): Payer: Medicare Other | Admitting: Family Medicine

## 2020-01-05 VITALS — BP 128/80 | HR 93 | Temp 96.5°F | Ht 61.0 in | Wt 125.4 lb

## 2020-01-05 DIAGNOSIS — I679 Cerebrovascular disease, unspecified: Secondary | ICD-10-CM

## 2020-01-05 DIAGNOSIS — I10 Essential (primary) hypertension: Secondary | ICD-10-CM

## 2020-01-05 DIAGNOSIS — E782 Mixed hyperlipidemia: Secondary | ICD-10-CM | POA: Diagnosis not present

## 2020-01-05 LAB — COMPREHENSIVE METABOLIC PANEL
ALT: 20 U/L (ref 0–35)
AST: 19 U/L (ref 0–37)
Albumin: 4.5 g/dL (ref 3.5–5.2)
Alkaline Phosphatase: 64 U/L (ref 39–117)
BUN: 15 mg/dL (ref 6–23)
CO2: 29 mEq/L (ref 19–32)
Calcium: 9.9 mg/dL (ref 8.4–10.5)
Chloride: 99 mEq/L (ref 96–112)
Creatinine, Ser: 0.64 mg/dL (ref 0.40–1.20)
GFR: 89.3 mL/min (ref >60.00)
Glucose, Bld: 104 mg/dL — ABNORMAL HIGH (ref 70–99)
Potassium: 4.5 mEq/L (ref 3.5–5.1)
Sodium: 137 mEq/L (ref 135–145)
Total Bilirubin: 0.4 mg/dL (ref 0.2–1.2)
Total Protein: 7.4 g/dL (ref 6.0–8.3)

## 2020-01-05 LAB — LIPID PANEL
Cholesterol: 167 mg/dL (ref 0–200)
HDL: 54.2 mg/dL (ref >39.00)
LDL Cholesterol: 80 mg/dL (ref 0–99)
NonHDL: 112.52
Total CHOL/HDL Ratio: 3
Triglycerides: 163 mg/dL — ABNORMAL HIGH (ref 0.0–149.0)
VLDL: 32.6 mg/dL (ref 0.0–40.0)

## 2020-01-05 MED ORDER — ASPIRIN-DIPYRIDAMOLE ER 25-200 MG PO CP12: 1.0000 | ORAL_CAPSULE | Freq: Two times a day (BID) | ORAL | 3 refills | Status: DC

## 2020-01-05 MED ORDER — FUROSEMIDE 20 MG PO TABS: ORAL_TABLET | ORAL | 5 refills | Status: DC

## 2020-01-05 MED ORDER — SIMVASTATIN 20 MG PO TABS: 20.0000 mg | ORAL_TABLET | Freq: Every day | ORAL | 3 refills | Status: DC

## 2020-01-05 MED ORDER — METOPROLOL SUCCINATE ER 50 MG PO TB24: ORAL_TABLET | ORAL | 3 refills | Status: DC

## 2020-01-05 MED ORDER — AMLODIPINE BESYLATE 10 MG PO TABS: 10.0000 mg | ORAL_TABLET | Freq: Every day | ORAL | 2 refills | Status: DC

## 2020-01-05 NOTE — Patient Instructions (Signed)
Give us 2-3 business days to get the results of your labs back.   Keep the diet clean and stay active.  Let us know if you need anything. 

## 2020-01-05 NOTE — Progress Notes (Signed)
Chief Complaint  Patient presents with  . Follow-up    6 month    Subjective [name] is a 79 y.o. female who presents for hypertension follow up. She does monitor home blood pressures. Blood pressures ranging from 120-130's/60-70's on average. She is compliant with medications- Toprol XL 50 mg/d, Norvasc 10 mg/d. Patient has these side effects of medication: none She is adhering to a healthy diet overall. Current exercise: walking, active in yard  Hyperlipidemia Patient presents for dyslipidemia follow up. Currently being treated with Zocor 20 mg/d and compliance with treatment thus far has been good. She denies myalgias. Diet/exercise as above.  The patient is not known to have coexisting coronary artery disease.    Past Medical History:  Diagnosis Date  . Allergic rhinitis, cause unspecified   . Aortic stenosis   . Benign neoplasm of colon   . Disorder of bone and cartilage, unspecified   . Hypercholesteremia   . Lumbago   . Malignant neoplasm of thyroid gland ([place])   . Mitral regurgitation   . Other and unspecified hyperlipidemia   . Other iatrogenic hypothyroidism   . Skin cancer    basal cell cancers  . Unspecified essential hypertension   . Unspecified transient cerebral ischemia     Review of Systems Cardiovascular: no chest pain Respiratory:  no shortness of breath  Exam BP 128/80 (BP Location: Left Arm, Patient Position: Sitting, Cuff Size: Normal)   Pulse 93   Temp (!) 96.5 F (35.8 C) (Temporal)   Ht 5\' 1"  (1.549 m)   Wt 125 lb 6 oz (56.9 kg)   SpO2 97%   BMI 23.69 kg/m  General:  well developed, well nourished, in no apparent distress Heart: RRR, 3/6 SEM heard loudest at aortic listening post, no LE edema Lungs: clear to auscultation, no accessory muscle use Psych: well oriented with normal range of affect and appropriate judgment/insight  Essential hypertension - Plan: amLODipine (NORVASC) 10 MG tablet, metoprolol succinate (TOPROL-XL)  50 MG 24 hr tablet, Comprehensive metabolic panel  Mixed hyperlipidemia - Plan: simvastatin (ZOCOR) 20 MG tablet, Lipid panel  Cerebrovascular disease, unspecified - Plan: dipyridamole-aspirin (AGGRENOX) 200-25 MG 12hr capsule  Orders as above. Counseled on diet and exercise. F/u in in 6 mo. The patient voiced understanding and agreement to the plan.  [place], DO 01/05/20  8:46 AM

## 2020-02-15 DIAGNOSIS — D485 Neoplasm of uncertain behavior of skin: Secondary | ICD-10-CM | POA: Diagnosis not present

## 2020-02-15 DIAGNOSIS — L820 Inflamed seborrheic keratosis: Secondary | ICD-10-CM | POA: Diagnosis not present

## 2020-03-28 DIAGNOSIS — H00025 Hordeolum internum left lower eyelid: Secondary | ICD-10-CM | POA: Diagnosis not present

## 2020-04-02 DIAGNOSIS — D485 Neoplasm of uncertain behavior of skin: Secondary | ICD-10-CM | POA: Diagnosis not present

## 2020-04-02 DIAGNOSIS — Z85828 Personal history of other malignant neoplasm of skin: Secondary | ICD-10-CM | POA: Diagnosis not present

## 2020-04-02 DIAGNOSIS — C4441 Basal cell carcinoma of skin of scalp and neck: Secondary | ICD-10-CM | POA: Diagnosis not present

## 2020-04-02 DIAGNOSIS — L821 Other seborrheic keratosis: Secondary | ICD-10-CM | POA: Diagnosis not present

## 2020-04-02 DIAGNOSIS — L905 Scar conditions and fibrosis of skin: Secondary | ICD-10-CM | POA: Diagnosis not present

## 2020-04-02 DIAGNOSIS — D1801 Hemangioma of skin and subcutaneous tissue: Secondary | ICD-10-CM | POA: Diagnosis not present

## 2020-04-02 DIAGNOSIS — L814 Other melanin hyperpigmentation: Secondary | ICD-10-CM | POA: Diagnosis not present

## 2020-04-02 DIAGNOSIS — D225 Melanocytic nevi of trunk: Secondary | ICD-10-CM | POA: Diagnosis not present

## 2020-06-03 ENCOUNTER — Encounter: Payer: Self-pay | Admitting: Family Medicine

## 2020-06-03 DIAGNOSIS — M85851 Other specified disorders of bone density and structure, right thigh: Secondary | ICD-10-CM | POA: Diagnosis not present

## 2020-06-03 DIAGNOSIS — Z1231 Encounter for screening mammogram for malignant neoplasm of breast: Secondary | ICD-10-CM | POA: Diagnosis not present

## 2020-06-03 DIAGNOSIS — M810 Age-related osteoporosis without current pathological fracture: Secondary | ICD-10-CM | POA: Diagnosis not present

## 2020-06-04 ENCOUNTER — Encounter: Payer: Self-pay | Admitting: Family Medicine

## 2020-06-07 ENCOUNTER — Other Ambulatory Visit: Payer: Self-pay

## 2020-06-07 ENCOUNTER — Encounter: Payer: Self-pay | Admitting: Family Medicine

## 2020-06-07 ENCOUNTER — Ambulatory Visit (INDEPENDENT_AMBULATORY_CARE_PROVIDER_SITE_OTHER): Payer: Medicare Other | Admitting: Family Medicine

## 2020-06-07 ENCOUNTER — Other Ambulatory Visit: Payer: Self-pay | Admitting: Family Medicine

## 2020-06-07 VITALS — BP 138/72 | HR 90 | Temp 98.3°F | Ht 60.0 in | Wt 123.2 lb

## 2020-06-07 DIAGNOSIS — M810 Age-related osteoporosis without current pathological fracture: Secondary | ICD-10-CM

## 2020-06-07 LAB — COMPREHENSIVE METABOLIC PANEL
ALT: 20 U/L (ref 0–35)
AST: 21 U/L (ref 0–37)
Albumin: 4.4 g/dL (ref 3.5–5.2)
Alkaline Phosphatase: 61 U/L (ref 39–117)
BUN: 19 mg/dL (ref 6–23)
CO2: 27 mEq/L (ref 19–32)
Calcium: 9.4 mg/dL (ref 8.4–10.5)
Chloride: 102 mEq/L (ref 96–112)
Creatinine, Ser: 0.7 mg/dL (ref 0.40–1.20)
GFR: 80.44 mL/min (ref >60.00)
Glucose, Bld: 154 mg/dL — ABNORMAL HIGH (ref 70–99)
Potassium: 4.1 mEq/L (ref 3.5–5.1)
Sodium: 136 mEq/L (ref 135–145)
Total Bilirubin: 0.4 mg/dL (ref 0.2–1.2)
Total Protein: 7.3 g/dL (ref 6.0–8.3)

## 2020-06-07 LAB — VITAMIN D 25 HYDROXY (VIT D DEFICIENCY, FRACTURES): VITD: 57.97 ng/mL (ref 30.00–100.00)

## 2020-06-07 MED ORDER — CALCIUM CARBONATE-VITAMIN D 600-400 MG-UNIT PO TABS: 2.0000 | ORAL_TABLET | Freq: Every day | ORAL | 3 refills | Status: DC

## 2020-06-07 MED ORDER — ALENDRONATE SODIUM 70 MG PO TABS: 70.0000 mg | ORAL_TABLET | ORAL | 11 refills | Status: DC

## 2020-06-07 MED ORDER — VITAMIN D 50 MCG (2000 UT) PO CAPS: ORAL_CAPSULE | ORAL | 0 refills | Status: AC

## 2020-06-07 NOTE — Patient Instructions (Signed)
Continue weight bearing exercise.  Give [place] 2-3 business days to get the results of your labs back.   Consider 1200 mg of calcium daily. Let's do 2000 units of vitamin D daily for now.  Let us know if you need anything.

## 2020-06-07 NOTE — Progress Notes (Signed)
Chief Complaint  Patient presents with   Results    bone density    Subjective: Patient is a 79 y.o. female here for follow-up bone density results.  Patient has a history of osteoporosis and has been on Fosamax in the past.  She went off in the early 2000's.  Is been nearly 20 years since she is taking it.  She reports stopping it because she was told there was concern about its effects on her heart.  She does try to be active at home and in her yard.  She takes 600 mg of calcium daily and just increased to 2000 units of vitamin D daily.  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[facility])    Mitral regurgitation    Other iatrogenic hypothyroidism    Skin cancer    basal cell cancers   Unspecified essential hypertension    Unspecified transient cerebral ischemia     Objective: BP 138/72 (BP Location: Left Arm, Patient Position: Sitting, Cuff Size: Normal)    Pulse 90    Temp 98.3 F (36.8 C) (Oral)    Ht 5' (1.524 m)    Wt 123 lb 4 oz (55.9 kg)    SpO2 97%    BMI 24.07 kg/m  General: Awake, appears stated age HEENT: MMM, EOMi Heart: RRR, no murmurs Lungs: CTAB, no rales, wheezes or rhonchi. No accessory muscle use Psych: Age appropriate judgment and insight, normal affect and mood  Assessment and Plan: Age-related osteoporosis without current pathological fracture - Plan: Comprehensive metabolic panel, VITAMIN D 25 Hydroxy (Vit-D Deficiency, Fractures)  We'll optimize vitamin D and calcium.  She is currently taking 2000 units of vitamin D with increased from 600 mg of calcium daily to 1200 mg daily.  Weightbearing exercise encouraged.  If above are optimized, will restart Fosamax. Follow-up pending the above The patient voiced understanding and agreement to the plan.  [place], DO 06/07/20  2:00  PM

## 2020-07-10 NOTE — Progress Notes (Signed)
Cardiology Office Note   Date:  07/11/2020   ID:  [name], DOB May 25, 1941, MRN [id number]  PCP:  Shelda Pal, DO  Cardiologist:   Minus Breeding, MD   Chief Complaint  Patient presents with  . Aortic Stenosis     History of Present Illness: [name] is a 79 y.o. female for follow up of mild AS and AI.  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    Since I last saw her she has done well.  She pushes and rides a mower.  She walks for exercise. The patient denies any new symptoms such as chest discomfort, neck or arm discomfort. There has been no new shortness of breath, PND or orthopnea. There have been no reported palpitations, presyncope or syncope.   Past Medical History:  Diagnosis Date  . Allergic rhinitis, cause unspecified   . Aortic stenosis   . Benign neoplasm of colon   . Hypercholesteremia   . Lumbago   . Malignant neoplasm of thyroid gland ([place])   . Mitral regurgitation   . Other iatrogenic hypothyroidism   . Skin cancer    basal cell cancers  . Unspecified essential hypertension   . Unspecified transient cerebral ischemia     Past Surgical History:  Procedure Laterality Date  . basal cell carcinoma removed from inside the left ear  11/04/2012   june 2015  . COLONOSCOPY    . DILATION AND CURETTAGE OF UTERUS    . EYE SURGERY  08/22/2014  . POLYPECTOMY    . TOTAL THYROIDECTOMY       Current Outpatient Medications  Medication Sig Dispense Refill  . alendronate (FOSAMAX) 70 MG tablet Take 1 tablet (70 mg total) by mouth every 7 (seven) days. Take with a full glass of water on an empty stomach. 4 tablet 11  . amLODipine (NORVASC) 10 MG tablet Take 1 tablet (10 mg total) by mouth daily. 90 tablet 2  . Calcium Carbonate-Vitamin D (CALCIUM 600+D) 600-400 MG-UNIT tablet Take 2 tablets by mouth daily. 60 tablet 3  . cetirizine (ZYRTEC) 5 MG tablet Take 5 mg by mouth as needed.      . Cholecalciferol  (VITAMIN D) 50 MCG (2000 UT) CAPS Take 1 capsule daily. 30 capsule 0  . dipyridamole-aspirin (AGGRENOX) 200-25 MG 12hr capsule Take 1 capsule by mouth 2 (two) times daily. 180 capsule 3  . furosemide (LASIX) 20 MG tablet Take 1/2 daily as needed for swelling 60 tablet 5  . Lactobacillus (PROBIOTIC ACIDOPHILUS PO) Take by mouth daily. Reported on 12/03/2015    . levothyroxine (SYNTHROID, LEVOTHROID) 100 MCG tablet Take 88 mcg and 100 mcg alternating days.    . metoprolol succinate (TOPROL-XL) 50 MG 24 hr tablet TAKE 1 TABLET BY MOUTH EVERY DAY TAKE WITH OR IMMEDIATELY FOLLOWING A MEAL 90 tablet 3  . Multiple Vitamins-Minerals (CENTRUM SILVER PO) Take 1 tablet by mouth daily.      . simvastatin (ZOCOR) 20 MG tablet Take 1 tablet (20 mg total) by mouth at bedtime. 90 tablet 3  . vitamin E 400 UNIT capsule Take 400 Units by mouth daily.     No current facility-administered medications for this visit.    Allergies:   Sulfonamide derivatives    ROS:  Please see the history of present illness.   Otherwise, review of systems are positive for none.   All other systems are reviewed and negative.    PHYSICAL EXAM: VS:  BP 136/66   Pulse 74   Ht 5' (1.524 m)   Wt 121 lb 12.8 oz (55.2 kg)   SpO2 96%   BMI 23.79 kg/m  , BMI Body mass index is 23.79 kg/m. GENERAL:  Well appearing NECK:  No jugular venous distention, waveform within normal limits, carotid upstroke brisk and symmetric, no bruits, no thyromegaly LUNGS:  Clear to auscultation bilaterally CHEST:  Unremarkable HEART:  PMI not displaced or sustained,S1 and S2 within normal limits, no S3, no S4, no clicks, no rubs, 2 out of 6 apical systolic murmur radiating slightly out the aortic outflow tract very early and no diastolic murmurs ABD:  Flat, positive bowel sounds normal in frequency in pitch, no bruits, no rebound, no guarding, no midline pulsatile mass, no hepatomegaly, no splenomegaly EXT:  2 plus pulses throughout, no edema, no  cyanosis no clubbing   EKG:  EKG is  ordered today. The ekg ordered today demonstrates normal sinus rhythm, rate 74. Normal axis, intervals.    Recent Labs: 06/07/2020: ALT 20; BUN 19; Creatinine, Ser 0.70; Potassium 4.1; Sodium 136    Lipid Panel    Component Value Date/Time   CHOL 167 01/05/2020 0826   TRIG 163.0 (H) 01/05/2020 0826   HDL 54.20 01/05/2020 0826   CHOLHDL 3 01/05/2020 0826   VLDL 32.6 01/05/2020 0826   LDLCALC 80 01/05/2020 0826   LDLDIRECT 200.1 02/29/2008 1026      Wt Readings from Last 3 Encounters:  07/11/20 121 lb 12.8 oz (55.2 kg)  06/07/20 123 lb 4 oz (55.9 kg)  01/05/20 125 lb 6 oz (56.9 kg)      Other studies Reviewed: Additional studies/ records that were reviewed today include: None Review of the above records demonstrates: NA   ASSESSMENT AND PLAN:  AS: this was mild on echo in July 2020.   There is no change in exam.  No need for follow-up.   AI:  Mild on echo in July.  As above.  HTN:   BP is controlled.  No change in therapy.   HLD:  LDL was 80 but her HDL is 56.  No change in therapy.   THYROID CANCER:   She is up-to-date with thyroid studies and replacement and follow-up.  TIA:   I will continue the Aggrenox.  Otherwise no change in therapy.  COVID EDUCATION: She has been vaccinated.  Current medicines are reviewed at length with the patient today.  The patient doe snot have concerns regarding medicines.  The following changes have been made:  None  Labs/ tests ordered today include: None   Orders Placed This Encounter  Procedures  . EKG 12-Lead     Disposition:   FU with me in one year.    Signed, Minus Breeding, MD  07/11/2020 1:19 PM    [facility]

## 2020-07-11 ENCOUNTER — Other Ambulatory Visit: Payer: Self-pay

## 2020-07-11 ENCOUNTER — Ambulatory Visit (INDEPENDENT_AMBULATORY_CARE_PROVIDER_SITE_OTHER): Payer: Medicare Other | Admitting: Cardiology

## 2020-07-11 ENCOUNTER — Encounter: Payer: Self-pay | Admitting: Cardiology

## 2020-07-11 VITALS — BP 136/66 | HR 74 | Ht 60.0 in | Wt 121.8 lb

## 2020-07-11 DIAGNOSIS — E785 Hyperlipidemia, unspecified: Secondary | ICD-10-CM

## 2020-07-11 DIAGNOSIS — I350 Nonrheumatic aortic (valve) stenosis: Secondary | ICD-10-CM

## 2020-07-11 DIAGNOSIS — I10 Essential (primary) hypertension: Secondary | ICD-10-CM | POA: Diagnosis not present

## 2020-07-11 DIAGNOSIS — Z7189 Other specified counseling: Secondary | ICD-10-CM | POA: Diagnosis not present

## 2020-07-11 DIAGNOSIS — I351 Nonrheumatic aortic (valve) insufficiency: Secondary | ICD-10-CM | POA: Diagnosis not present

## 2020-07-11 NOTE — Patient Instructions (Signed)
Medication Instructions:  No Changes In Medications at this time.  *If you need a refill on your cardiac medications before your next appointment, please call your pharmacy*   Lab Work: None Ordered At This Time.  If you have labs (blood work) drawn today and your tests are completely normal, you will receive your results only by: [name] MyChart Message (if you have MyChart) OR . A paper copy in the mail If you have any lab test that is abnormal or we need to change your treatment, we will call you to review the results.   Testing/Procedures: None Ordered At This Time.   Follow-Up: At [facility], you and your health needs are our priority.  As part of our continuing mission to provide you with exceptional heart care, we have created designated Provider Care Teams.  These Care Teams include your primary Cardiologist (physician) and Advanced Practice Providers (APPs -  Physician Assistants and Nurse Practitioners) who all work together to provide you with the care you need, when you need it.  We recommend signing up for the patient portal called "MyChart".  Sign up information is provided on this After Visit Summary.  MyChart is used to connect with patients for Virtual Visits (Telemedicine).  Patients are able to view lab/test results, encounter notes, upcoming appointments, etc.  Non-urgent messages can be sent to your provider as well.   To learn more about what you can do with MyChart, go to [email or web address].    Your next appointment:   1 year(s)  The format for your next appointment:   In Person  Provider:   Minus Breeding, MD

## 2020-08-05 DIAGNOSIS — Z85850 Personal history of malignant neoplasm of thyroid: Secondary | ICD-10-CM | POA: Diagnosis not present

## 2020-08-05 DIAGNOSIS — R7309 Other abnormal glucose: Secondary | ICD-10-CM | POA: Diagnosis not present

## 2020-08-05 DIAGNOSIS — E890 Postprocedural hypothyroidism: Secondary | ICD-10-CM | POA: Diagnosis not present

## 2020-08-08 DIAGNOSIS — E890 Postprocedural hypothyroidism: Secondary | ICD-10-CM | POA: Diagnosis not present

## 2020-08-08 DIAGNOSIS — Z85850 Personal history of malignant neoplasm of thyroid: Secondary | ICD-10-CM | POA: Diagnosis not present

## 2020-08-08 DIAGNOSIS — R7309 Other abnormal glucose: Secondary | ICD-10-CM | POA: Diagnosis not present

## 2020-08-08 DIAGNOSIS — Z833 Family history of diabetes mellitus: Secondary | ICD-10-CM | POA: Diagnosis not present

## 2020-08-08 DIAGNOSIS — Z23 Encounter for immunization: Secondary | ICD-10-CM | POA: Diagnosis not present

## 2020-08-13 DIAGNOSIS — Z85828 Personal history of other malignant neoplasm of skin: Secondary | ICD-10-CM | POA: Diagnosis not present

## 2020-08-13 DIAGNOSIS — L905 Scar conditions and fibrosis of skin: Secondary | ICD-10-CM | POA: Diagnosis not present

## 2020-09-04 DIAGNOSIS — Z23 Encounter for immunization: Secondary | ICD-10-CM | POA: Diagnosis not present

## 2020-10-11 ENCOUNTER — Telehealth: Payer: Self-pay | Admitting: Family Medicine

## 2020-10-11 ENCOUNTER — Encounter: Payer: Self-pay | Admitting: Family Medicine

## 2020-10-11 ENCOUNTER — Other Ambulatory Visit: Payer: Self-pay

## 2020-10-11 ENCOUNTER — Telehealth (INDEPENDENT_AMBULATORY_CARE_PROVIDER_SITE_OTHER): Payer: Medicare Other | Admitting: Family Medicine

## 2020-10-11 VITALS — BP 169/70 | HR 74 | Temp 98.6°F

## 2020-10-11 DIAGNOSIS — J019 Acute sinusitis, unspecified: Secondary | ICD-10-CM

## 2020-10-11 DIAGNOSIS — R030 Elevated blood-pressure reading, without diagnosis of hypertension: Secondary | ICD-10-CM

## 2020-10-11 MED ORDER — AZITHROMYCIN 250 MG PO TABS: ORAL_TABLET | ORAL | 0 refills | Status: DC

## 2020-10-11 MED ORDER — METHYLPREDNISOLONE 4 MG PO TBPK: ORAL_TABLET | ORAL | 0 refills | Status: DC

## 2020-10-11 NOTE — Telephone Encounter (Signed)
Patient called in reference to her having a cold, headache  and sinus pressure/ under her eyes.Patinet states no fever and taking Zyrtec  and   Mucinex. Patient states she would like Dr Nani Ravens to send a medication to the local pharmacy.

## 2020-10-11 NOTE — Telephone Encounter (Signed)
Schedule same day appt with PCP today .

## 2020-10-11 NOTE — Progress Notes (Signed)
Chief Complaint  Patient presents with  . Sinusitis  . Eye Drainage    [name] here for URI complaints. Due to COVID-19 pandemic, we are interacting via web portal for an electronic face-to-face visit. I verified patient's ID using 2 identifiers. Patient agreed to proceed with visit via this method. Patient is at home, I am at office. Patient and I are present for visit.   Duration: 2 weeks; states this happens around the same time yearly.  Associated symptoms: sinus congestion, ear pain, sinus pain, rhinorrhea and itchy watery eyes Denies: coughing, ear drainage, sore throat, wheezing, shortness of breath, myalgia and fevers Treatment to date: Mucinex Sick contacts: no  Past Medical History:  Diagnosis Date  . Allergic rhinitis, cause unspecified   . Aortic stenosis   . Benign neoplasm of colon   . Hypercholesteremia   . Lumbago   . Malignant neoplasm of thyroid gland ([place])   . Mitral regurgitation   . Other iatrogenic hypothyroidism   . Skin cancer    basal cell cancers  . Unspecified essential hypertension   . Unspecified transient cerebral ischemia     BP (!) 169/70 (BP Location: Left Arm, Patient Position: Sitting, Cuff Size: Normal)   Pulse 74   Temp 98.6 F (37 C) (Oral)  No conversational dyspnea Age appropriate judgment and insight Nml affect and mood  Acute non-recurrent sinusitis, unspecified location  Elevated blood pressure reading  Dosepak first, if no better, Zpak as that worked well for in past. Sounds like allergy induced though.  Continue to push fluids, practice good hand hygiene, cover mouth when coughing. Pt will monitor BP at home and let us know if >150/90 consistently.  F/u prn. If starting to experience fevers, shaking, or shortness of breath, seek immediate care. Total time: 11 min Pt voiced understanding and agreement to the plan.  [place], DO 10/11/20 2:33 PM

## 2020-10-30 ENCOUNTER — Other Ambulatory Visit: Payer: Self-pay | Admitting: Family Medicine

## 2020-10-30 DIAGNOSIS — I10 Essential (primary) hypertension: Secondary | ICD-10-CM

## 2020-11-14 ENCOUNTER — Ambulatory Visit: Payer: Self-pay | Admitting: *Deleted

## 2020-11-20 NOTE — Progress Notes (Unsigned)
Subjective:   [name] is a 80 y.o. female who presents for Medicare Annual (Subsequent) preventive examination.   I connected with Shalie today by telephone and verified that I am speaking with the correct person using two identifiers. Location patient: home Location provider: work Persons participating in the virtual visit: patient, Marine scientist.    I discussed the limitations, risks, security and privacy concerns of performing an evaluation and management service by telephone and the availability of in person appointments. I also discussed with the patient that there may be a patient responsible charge related to this service. The patient expressed understanding and verbally consented to this telephonic visit.    Interactive audio and video telecommunications were attempted between this provider and patient, however failed, due to patient having technical difficulties OR patient did not have access to video capability.  We continued and completed visit with audio only.  Some vital signs may be absent or patient reported.   Time Spent with patient on telephone encounter: 35 minutes  Review of Systems     Cardiac Risk Factors include: advanced age (>64men, >46 women);hypertension;dyslipidemia     Objective:    Today's Vitals   11/21/20 1517  Weight: 121 lb (54.9 kg)  Height: 5' (1.524 m)   Body mass index is 23.63 kg/m.  Advanced Directives 11/21/2020 11/14/2019  Does Patient Have a Medical Advance Directive? Yes Yes  Type of Paramedic of [place];Living will [place];Living will  Does patient want to make changes to medical advance directive? - No - Patient declined  Copy of [place] in Chart? Yes - validated most recent copy scanned in chart (See row information) Yes - validated most recent copy scanned in chart (See row information)    Current Medications (verified) Outpatient Encounter Medications as of  11/21/2020  Medication Sig  . amLODipine (NORVASC) 10 MG tablet TAKE 1 TABLET BY MOUTH DAILY  . Calcium Carbonate-Vitamin D (CALCIUM 600+D) 600-400 MG-UNIT tablet Take 2 tablets by mouth daily.  . cetirizine (ZYRTEC) 5 MG tablet Take 5 mg by mouth as needed.  . Cholecalciferol (VITAMIN D) 50 MCG (2000 UT) CAPS Take 1 capsule daily.  [name] dipyridamole-aspirin (AGGRENOX) 200-25 MG 12hr capsule Take 1 capsule by mouth 2 (two) times daily.  . furosemide (LASIX) 20 MG tablet Take 1/2 daily as needed for swelling  . Lactobacillus (PROBIOTIC ACIDOPHILUS PO) Take by mouth daily. Reported on 12/03/2015  . levothyroxine (SYNTHROID, LEVOTHROID) 100 MCG tablet Take 88 mcg and 100 mcg alternating days.  . metoprolol succinate (TOPROL-XL) 50 MG 24 hr tablet TAKE 1 TABLET BY MOUTH EVERY DAY TAKE WITH OR IMMEDIATELY FOLLOWING A MEAL  . Multiple Vitamins-Minerals (CENTRUM SILVER PO) Take 1 tablet by mouth daily.  . simvastatin (ZOCOR) 20 MG tablet Take 1 tablet (20 mg total) by mouth at bedtime.  . vitamin E 400 UNIT capsule Take 400 Units by mouth daily.  . [DISCONTINUED] azithromycin (ZITHROMAX) 250 MG tablet Take 2 tabs the first day and then 1 tab daily until you run out.  . [DISCONTINUED] methylPREDNISolone (MEDROL DOSEPAK) 4 MG TBPK tablet Follow instructions on package.   No facility-administered encounter medications on file as of 11/21/2020.    Allergies (verified) Sulfonamide derivatives   History: Past Medical History:  Diagnosis Date  . Allergic rhinitis, cause unspecified   . Aortic stenosis   . Benign neoplasm of colon   . Hypercholesteremia   . Lumbago   . Malignant neoplasm of  thyroid gland ([place])   . Mitral regurgitation   . Other iatrogenic hypothyroidism   . Skin cancer    basal cell cancers  . Unspecified essential hypertension   . Unspecified transient cerebral ischemia    Past Surgical History:  Procedure Laterality Date  . basal cell carcinoma removed from inside the left ear   11/04/2012   june 2015  . COLONOSCOPY    . DILATION AND CURETTAGE OF UTERUS    . EYE SURGERY  08/22/2014  . POLYPECTOMY    . TOTAL THYROIDECTOMY     Family History  Problem Relation Age of Onset  . Diabetes Mother   . Congestive Heart Failure Mother   . Heart disease Mother   . Hypertension Mother   . Kidney disease Mother   . Hypertension Father   . CVA Father 30  . Early death Father   . Stroke Father   . CAD Brother        stents placed, CABG  . Cancer Brother   . Diabetes Brother   . Hyperlipidemia Brother   . Hypertension Brother   . Prostate cancer Brother   . Cancer Brother   . Diabetes Brother   . Early death Brother   . Hypertension Brother   . Hyperlipidemia Brother   . Lung cancer Brother   . Cancer Brother   . Early death Brother   . Hyperlipidemia Brother   . Hypertension Brother   . Bone cancer Brother   . Colon polyps Sister        x 3   . Diabetes Sister   . Colon cancer Neg Hx   . Esophageal cancer Neg Hx   . Stomach cancer Neg Hx    Social History   Socioeconomic History  . Marital status: Widowed    Spouse name: Deondria Puryear  . Number of children: 0  . Years of education: Not on file  . Highest education level: Not on file  Occupational History  . Occupation: Retired    Comment: data entry from [employer]  . Smoking status: Never Smoker  . Smokeless tobacco: Never Used  Vaping Use  . Vaping Use: Never used  Substance and Sexual Activity  . Alcohol use: No    Alcohol/week: 0.0 standard drinks  . Drug use: No  . Sexual activity: Not on file  Other Topics Concern  . Not on file  Social History Narrative   Widow   Social Determinants of Health   Financial Resource Strain: Low Risk   . Difficulty of Paying Living Expenses: Not hard at all  Food Insecurity: No Food Insecurity  . Worried About Charity fundraiser in the Last Year: Never true  . Ran Out of Food in the Last Year: Never true  Transportation Needs: No  Transportation Needs  . Lack of Transportation (Medical): No  . Lack of Transportation (Non-Medical): No  Physical Activity: Sufficiently Active  . Days of Exercise per Week: 5 days  . Minutes of Exercise per Session: 30 min  Stress: No Stress Concern Present  . Feeling of Stress : Not at all  Social Connections: Moderately Integrated  . Frequency of Communication with Friends and Family: More than three times a week  . Frequency of Social Gatherings with Friends and Family: More than three times a week  . Attends Religious Services: More than 4 times per year  . Active Member of Clubs or Organizations: Yes  . Attends Archivist  Meetings: 1 to 4 times per year  . Marital Status: Widowed    Tobacco Counseling Counseling given: Not Answered   Clinical Intake:  Pre-visit preparation completed: Yes  Pain : No/denies pain     Nutritional Status: BMI of 19-24  Normal Nutritional Risks: None Diabetes: No  How often do you need to have someone help you when you read instructions, pamphlets, or other written materials from your doctor or pharmacy?: 1 - Never  Diabetic?No  Interpreter Needed?: No  Information entered by :: Caroleen Hamman LPN   Activities of Daily Living In your present state of health, do you have any difficulty performing the following activities: 11/21/2020  Hearing? N  Vision? N  Difficulty concentrating or making decisions? N  Walking or climbing stairs? N  Dressing or bathing? N  Doing errands, shopping? N  Preparing Food and eating ? N  Using the Toilet? N  In the past six months, have you accidently leaked urine? Y  Comment occasionally  Do you have problems with loss of bowel control? N  Managing your Medications? N  Managing your Finances? N  Housekeeping or managing your Housekeeping? N  Some recent data might be hidden    Patient Care Team: Shelda Pal, DO as PCP - General (Family Medicine) Minus Breeding, MD as  PCP - Cardiology (Cardiology) Delrae Rend, MD as Consulting Physician (Endocrinology) Rutherford Guys, MD as Consulting Physician (Ophthalmology) Minus Breeding, MD as Consulting Physician (Cardiology)  Indicate any recent Medical Services you may have received from other than [facility] providers in the past year (date may be approximate).     Assessment:   This is a routine wellness examination for Jalesa.  Hearing/Vision screen  Hearing Screening   125Hz  250Hz  500Hz  1000Hz  2000Hz  3000Hz  4000Hz  6000Hz  8000Hz   Right ear:           Left ear:           Comments: No issues  Vision Screening Comments: Reading glasses Last eye exam-[id number]-Dr. Gershon Crane  Dietary issues and exercise activities discussed: Current Exercise Habits: Home exercise routine, Type of exercise: walking, Time (Minutes): 30, Frequency (Times/Week): 5, Weekly Exercise (Minutes/Week): 150, Exercise limited by: None identified  Goals    . Continue walking and reading.      Depression Screen PHQ 2/9 Scores 11/21/2020 11/14/2019 05/24/2014 05/11/2013  PHQ - 2 Score 0 0 0 0    Fall Risk Fall Risk  11/21/2020 11/14/2019 05/24/2014 05/11/2013  Falls in the past year? 0 1 No No  Number falls in past yr: 0 0 - -  Injury with Fall? 0 0 - -  Follow up Falls prevention discussed Education provided;Falls prevention discussed - -    FALL RISK PREVENTION PERTAINING TO THE HOME:  Any stairs in or around the home? No  Home free of loose throw rugs in walkways, pet beds, electrical cords, etc? Yes  Adequate lighting in your home to reduce risk of falls? Yes   ASSISTIVE DEVICES UTILIZED TO PREVENT FALLS:  Life alert? No  Use of a cane, walker or w/c? No  Grab bars in the bathroom? Yes  Shower chair or bench in shower? No  Elevated toilet seat or a handicapped toilet? No   TIMED UP AND GO:  Was the test performed? No . Phone visit   Cognitive Function:Normal cognitive status assessed by this Nurse Health Advisor. No  abnormalities found.       6CIT Screen 11/14/2019  What Year? 0 points  What  month? 0 points  What time? 0 points  Count back from 20 0 points  Months in reverse 0 points  Repeat phrase 2 points  Total Score 2    Immunizations Immunization History  Administered Date(s) Administered  . Fluad Quad(high Dose 65+) 08/18/2019  . H1N1 10/09/2008  . Influenza Split 07/20/2011, 07/14/2012  . Influenza Whole 07/25/2008, 08/09/2009, 07/25/2010  . Influenza, High Dose Seasonal PF 08/21/2016, 08/20/2017, 07/20/2018, 08/08/2020  . Influenza,inj,Quad PF,6+ Mos 07/26/2013, 07/30/2014, 07/30/2015  . PFIZER(Purple Top)SARS-COV-2 Vaccination 11/21/2019, 12/12/2019, 09/04/2020  . Pneumococcal Conjugate-13 05/24/2014  . Pneumococcal Polysaccharide-23 11/02/2006  . Td 02/27/2009  . Zoster 11/02/2010  . Zoster Recombinat (Shingrix) 09/01/2017, 11/01/2017    TDAP status: Due, Education has been provided regarding the importance of this vaccine. Advised may receive this vaccine at local pharmacy or Health Dept. Aware to provide a copy of the vaccination record if obtained from local pharmacy or Health Dept. Verbalized acceptance and understanding.  Flu Vaccine status: Up to date  Pneumococcal vaccine status: Up to date  Covid-19 vaccine status: Completed vaccines  Qualifies for Shingles Vaccine? No   Zostavax completed Yes   Shingrix Completed?: Yes  Screening Tests Health Maintenance  Topic Date Due  . COVID-19 Vaccine (4 - Booster for Pfizer series) 03/04/2021  . MAMMOGRAM  06/03/2021  . INFLUENZA VACCINE  Completed  . DEXA SCAN  Completed  . PNA vac Low Risk Adult  Completed  . TETANUS/TDAP  Discontinued    Health Maintenance  There are no preventive care reminders to display for this patient.  Colorectal cancer screening: No longer required.   Mammogram status: Completed Bilaterl 06/03/2020. Repeat every year  Bone Density status: Completed 06/03/2020. Results reflect: Bone  density results: OSTEOPOROSIS. Repeat every 2 years.  Lung Cancer Screening: (Low Dose CT Chest recommended if Age 5-80 years, 30 pack-year currently smoking OR have quit w/in 15years.) does not qualify.    Additional Screening:  Hepatitis C Screening: does not qualify  Vision Screening: Recommended annual ophthalmology exams for early detection of glaucoma and other disorders of the eye. Is the patient up to date with their annual eye exam?  Yes  Who is the provider or what is the name of the office in which the patient attends annual eye exams? Dr. Gershon Crane   Dental Screening: Recommended annual dental exams for proper oral hygiene  Community Resource Referral / Chronic Care Management: CRR required this visit?  No   CCM required this visit?  No      Plan:     I have personally reviewed and noted the following in the patient's chart:   . Medical and social history . Use of alcohol, tobacco or illicit drugs  . Current medications and supplements . Functional ability and status . Nutritional status . Physical activity . Advanced directives . List of other physicians . Hospitalizations, surgeries, and ER visits in previous 12 months . Vitals . Screenings to include cognitive, depression, and falls . Referrals and appointments  In addition, I have reviewed and discussed with patient certain preventive protocols, quality metrics, and best practice recommendations. A written personalized care plan for preventive services as well as general preventive health recommendations were provided to patient.   Due to this being a telephonic visit, the after visit summary with patients personalized plan was offered to patient via mail or my-chart. Per request, patient was mailed a copy of [place], LPN   [id number]  Nurse Health Advisor  Nurse  Notes: None

## 2020-11-21 ENCOUNTER — Ambulatory Visit (INDEPENDENT_AMBULATORY_CARE_PROVIDER_SITE_OTHER): Payer: Medicare Other

## 2020-11-21 VITALS — Ht 60.0 in | Wt 121.0 lb

## 2020-11-21 DIAGNOSIS — Z0000 Encounter for general adult medical examination without abnormal findings: Secondary | ICD-10-CM

## 2020-11-21 NOTE — Patient Instructions (Signed)
.[name] , Thank you for taking time to complete your Medicare Wellness Visit. I appreciate your ongoing commitment to your health goals. Please review the following plan we discussed and let me know if I can assist you in the future.   Screening recommendations/referrals: Colonoscopy: No longer required Mammogram: Completed 06/03/2020-Due 06/03/2021 Bone Density: Completed 06/03/2020-Due 06/03/2022 Recommended yearly ophthalmology/optometry visit for glaucoma screening and checkup Recommended yearly dental visit for hygiene and checkup  Vaccinations: Influenza vaccine: Up to date Pneumococcal vaccine: Completed vaccines Tdap vaccine: Discuss with pharmacy Shingles vaccine: Completed vaccines Covid-19:Completed vaccines  Advanced directives: Copy in chart  Conditions/risks identified: See problem list  Next appointment: Follow up in one year for your annual wellness visit    Preventive Care 65 Years and Older, Female Preventive care refers to lifestyle choices and visits with your health care provider that can promote health and wellness. What does preventive care include?  A yearly physical exam. This is also called an annual well check.  Dental exams once or twice a year.  Routine eye exams. Ask your health care provider how often you should have your eyes checked.  Personal lifestyle choices, including:  Daily care of your teeth and gums.  Regular physical activity.  Eating a healthy diet.  Avoiding tobacco and drug use.  Limiting alcohol use.  Practicing safe sex.  Taking low-dose aspirin every day.  Taking vitamin and mineral supplements as recommended by your health care provider. What happens during an annual well check? The services and screenings done by your health care provider during your annual well check will depend on your age, overall health, lifestyle risk factors, and family history of disease. Counseling  Your health care provider may ask you  questions about your:  Alcohol use.  Tobacco use.  Drug use.  Emotional well-being.  Home and relationship well-being.  Sexual activity.  Eating habits.  History of falls.  Memory and ability to understand (cognition).  Work and work Statistician.  Reproductive health. Screening  You may have the following tests or measurements:  Height, weight, and BMI.  Blood pressure.  Lipid and cholesterol levels. These may be checked every 5 years, or more frequently if you are over 62 years old.  Skin check.  Lung cancer screening. You may have this screening every year starting at age 71 if you have a 30-pack-year history of smoking and currently smoke or have quit within the past 15 years.  Fecal occult blood test (FOBT) of the stool. You may have this test every year starting at age 10.  Flexible sigmoidoscopy or colonoscopy. You may have a sigmoidoscopy every 5 years or a colonoscopy every 10 years starting at age 62.  Hepatitis C blood test.  Hepatitis B blood test.  Sexually transmitted disease (STD) testing.  Diabetes screening. This is done by checking your blood sugar (glucose) after you have not eaten for a while (fasting). You may have this done every 1-3 years.  Bone density scan. This is done to screen for osteoporosis. You may have this done starting at age 8.  Mammogram. This may be done every 1-2 years. Talk to your health care provider about how often you should have regular mammograms. Talk with your health care provider about your test results, treatment options, and if necessary, the need for more tests. Vaccines  Your health care provider may recommend certain vaccines, such as:  Influenza vaccine. This is recommended every year.  Tetanus, diphtheria, and acellular pertussis (Tdap, Td) vaccine. You  may need a Td booster every 10 years.  Zoster vaccine. You may need this after age 40.  Pneumococcal 13-valent conjugate (PCV13) vaccine. One dose is  recommended after age 14.  Pneumococcal polysaccharide (PPSV23) vaccine. One dose is recommended after age 76. Talk to your health care provider about which screenings and vaccines you need and how often you need them. This information is not intended to replace advice given to you by your health care provider. Make sure you discuss any questions you have with your health care provider. Document Released: 11/15/2015 Document Revised: 07/08/2016 Document Reviewed: 08/20/2015 Elsevier Interactive Patient Education  2017 [place] Prevention in the Home Falls can cause injuries. They can happen to people of all ages. There are many things you can do to make your home safe and to help prevent falls. What can I do on the outside of my home?  Regularly fix the edges of walkways and driveways and fix any cracks.  Remove anything that might make you trip as you walk through a door, such as a raised step or threshold.  Trim any bushes or trees on the path to your home.  Use bright outdoor lighting.  Clear any walking paths of anything that might make someone trip, such as rocks or tools.  Regularly check to see if handrails are loose or broken. Make sure that both sides of any steps have handrails.  Any raised decks and porches should have guardrails on the edges.  Have any leaves, snow, or ice cleared regularly.  Use sand or salt on walking paths during winter.  Clean up any spills in your garage right away. This includes oil or grease spills. What can I do in the bathroom?  Use night lights.  Install grab bars by the toilet and in the tub and shower. Do not use towel bars as grab bars.  Use non-skid mats or decals in the tub or shower.  If you need to sit down in the shower, use a plastic, non-slip stool.  Keep the floor dry. Clean up any water that spills on the floor as soon as it happens.  Remove soap buildup in the tub or shower regularly.  Attach bath mats  securely with double-sided non-slip rug tape.  Do not have throw rugs and other things on the floor that can make you trip. What can I do in the bedroom?  Use night lights.  Make sure that you have a light by your bed that is easy to reach.  Do not use any sheets or blankets that are too big for your bed. They should not hang down onto the floor.  Have a firm chair that has side arms. You can use this for support while you get dressed.  Do not have throw rugs and other things on the floor that can make you trip. What can I do in the kitchen?  Clean up any spills right away.  Avoid walking on wet floors.  Keep items that you use a lot in easy-to-reach places.  If you need to reach something above you, use a strong step stool that has a grab bar.  Keep electrical cords out of the way.  Do not use floor polish or wax that makes floors slippery. If you must use wax, use non-skid floor wax.  Do not have throw rugs and other things on the floor that can make you trip. What can I do with my stairs?  Do not leave any items  on the stairs.  Make sure that there are handrails on both sides of the stairs and use them. Fix handrails that are broken or loose. Make sure that handrails are as long as the stairways.  Check any carpeting to make sure that it is firmly attached to the stairs. Fix any carpet that is loose or worn.  Avoid having throw rugs at the top or bottom of the stairs. If you do have throw rugs, attach them to the floor with carpet tape.  Make sure that you have a light switch at the top of the stairs and the bottom of the stairs. If you do not have them, ask someone to add them for you. What else can I do to help prevent falls?  Wear shoes that:  Do not have high heels.  Have rubber bottoms.  Are comfortable and fit you well.  Are closed at the toe. Do not wear sandals.  If you use a stepladder:  Make sure that it is fully opened. Do not climb a closed  stepladder.  Make sure that both sides of the stepladder are locked into place.  Ask someone to hold it for you, if possible.  Clearly mark and make sure that you can see:  Any grab bars or handrails.  First and last steps.  Where the edge of each step is.  Use tools that help you move around (mobility aids) if they are needed. These include:  Canes.  Walkers.  Scooters.  Crutches.  Turn on the lights when you go into a dark area. Replace any light bulbs as soon as they burn out.  Set up your furniture so you have a clear path. Avoid moving your furniture around.  If any of your floors are uneven, fix them.  If there are any pets around you, be aware of where they are.  Review your medicines with your doctor. Some medicines can make you feel dizzy. This can increase your chance of falling. Ask your doctor what other things that you can do to help prevent falls. This information is not intended to replace advice given to you by your health care provider. Make sure you discuss any questions you have with your health care provider. Document Released: 08/15/2009 Document Revised: 03/26/2016 Document Reviewed: 11/23/2014 Elsevier Interactive Patient Education  2017 [employer].

## 2020-12-19 DIAGNOSIS — L03012 Cellulitis of left finger: Secondary | ICD-10-CM | POA: Diagnosis not present

## 2020-12-19 DIAGNOSIS — L080 Pyoderma: Secondary | ICD-10-CM | POA: Diagnosis not present

## 2021-01-13 ENCOUNTER — Other Ambulatory Visit: Payer: Self-pay | Admitting: Family Medicine

## 2021-01-13 DIAGNOSIS — I679 Cerebrovascular disease, unspecified: Secondary | ICD-10-CM

## 2021-01-27 ENCOUNTER — Other Ambulatory Visit: Payer: Self-pay | Admitting: Family Medicine

## 2021-02-05 ENCOUNTER — Ambulatory Visit
Admission: RE | Admit: 2021-02-05 | Discharge: 2021-02-05 | Disposition: A | Discharge status: Home / Self Care | Payer: Medicare Other | Source: Ambulatory Visit | Attending: Internal Medicine | Admitting: Internal Medicine

## 2021-02-05 ENCOUNTER — Other Ambulatory Visit: Payer: Self-pay | Admitting: Internal Medicine

## 2021-02-05 DIAGNOSIS — E890 Postprocedural hypothyroidism: Secondary | ICD-10-CM | POA: Diagnosis not present

## 2021-02-05 DIAGNOSIS — M25812 Other specified joint disorders, left shoulder: Secondary | ICD-10-CM | POA: Diagnosis not present

## 2021-02-05 DIAGNOSIS — Z85850 Personal history of malignant neoplasm of thyroid: Secondary | ICD-10-CM | POA: Diagnosis not present

## 2021-02-05 DIAGNOSIS — M25512 Pain in left shoulder: Secondary | ICD-10-CM | POA: Diagnosis not present

## 2021-02-05 DIAGNOSIS — M2540 Effusion, unspecified joint: Secondary | ICD-10-CM | POA: Diagnosis not present

## 2021-02-05 DIAGNOSIS — I700 Atherosclerosis of aorta: Secondary | ICD-10-CM | POA: Diagnosis not present

## 2021-02-05 DIAGNOSIS — R7303 Prediabetes: Secondary | ICD-10-CM | POA: Diagnosis not present

## 2021-02-05 DIAGNOSIS — Z833 Family history of diabetes mellitus: Secondary | ICD-10-CM | POA: Diagnosis not present

## 2021-02-05 DIAGNOSIS — Z23 Encounter for immunization: Secondary | ICD-10-CM | POA: Diagnosis not present

## 2021-02-12 ENCOUNTER — Other Ambulatory Visit: Payer: Self-pay | Admitting: Family Medicine

## 2021-02-12 DIAGNOSIS — E782 Mixed hyperlipidemia: Secondary | ICD-10-CM

## 2021-02-12 DIAGNOSIS — I679 Cerebrovascular disease, unspecified: Secondary | ICD-10-CM

## 2021-02-12 DIAGNOSIS — I10 Essential (primary) hypertension: Secondary | ICD-10-CM

## 2021-03-13 ENCOUNTER — Other Ambulatory Visit: Payer: Self-pay | Admitting: Family Medicine

## 2021-03-13 DIAGNOSIS — I679 Cerebrovascular disease, unspecified: Secondary | ICD-10-CM

## 2021-03-17 ENCOUNTER — Other Ambulatory Visit: Payer: Self-pay | Admitting: Family Medicine

## 2021-04-13 ENCOUNTER — Other Ambulatory Visit: Payer: Self-pay | Admitting: Family Medicine

## 2021-04-13 DIAGNOSIS — I679 Cerebrovascular disease, unspecified: Secondary | ICD-10-CM

## 2021-04-21 DIAGNOSIS — D225 Melanocytic nevi of trunk: Secondary | ICD-10-CM | POA: Diagnosis not present

## 2021-04-21 DIAGNOSIS — Z85828 Personal history of other malignant neoplasm of skin: Secondary | ICD-10-CM | POA: Diagnosis not present

## 2021-04-21 DIAGNOSIS — C44319 Basal cell carcinoma of skin of other parts of face: Secondary | ICD-10-CM | POA: Diagnosis not present

## 2021-04-21 DIAGNOSIS — L570 Actinic keratosis: Secondary | ICD-10-CM | POA: Diagnosis not present

## 2021-04-21 DIAGNOSIS — C44519 Basal cell carcinoma of skin of other part of trunk: Secondary | ICD-10-CM | POA: Diagnosis not present

## 2021-04-21 DIAGNOSIS — L814 Other melanin hyperpigmentation: Secondary | ICD-10-CM | POA: Diagnosis not present

## 2021-04-21 DIAGNOSIS — D1801 Hemangioma of skin and subcutaneous tissue: Secondary | ICD-10-CM | POA: Diagnosis not present

## 2021-04-21 DIAGNOSIS — L820 Inflamed seborrheic keratosis: Secondary | ICD-10-CM | POA: Diagnosis not present

## 2021-04-21 DIAGNOSIS — L821 Other seborrheic keratosis: Secondary | ICD-10-CM | POA: Diagnosis not present

## 2021-04-21 DIAGNOSIS — L905 Scar conditions and fibrosis of skin: Secondary | ICD-10-CM | POA: Diagnosis not present

## 2021-04-21 DIAGNOSIS — D485 Neoplasm of uncertain behavior of skin: Secondary | ICD-10-CM | POA: Diagnosis not present

## 2021-04-28 DIAGNOSIS — Z961 Presence of intraocular lens: Secondary | ICD-10-CM | POA: Diagnosis not present

## 2021-05-06 DIAGNOSIS — C44319 Basal cell carcinoma of skin of other parts of face: Secondary | ICD-10-CM | POA: Diagnosis not present

## 2021-05-11 ENCOUNTER — Other Ambulatory Visit: Payer: Self-pay | Admitting: Family Medicine

## 2021-05-12 ENCOUNTER — Other Ambulatory Visit: Payer: Self-pay | Admitting: Family Medicine

## 2021-05-12 DIAGNOSIS — I679 Cerebrovascular disease, unspecified: Secondary | ICD-10-CM

## 2021-05-12 MED ORDER — ASPIRIN-DIPYRIDAMOLE ER 25-200 MG PO CP12: 1.0000 | ORAL_CAPSULE | Freq: Two times a day (BID) | ORAL | 0 refills | Status: DC

## 2021-05-30 DIAGNOSIS — C44519 Basal cell carcinoma of skin of other part of trunk: Secondary | ICD-10-CM | POA: Diagnosis not present

## 2021-06-11 ENCOUNTER — Other Ambulatory Visit: Payer: Self-pay | Admitting: Family Medicine

## 2021-06-11 DIAGNOSIS — I679 Cerebrovascular disease, unspecified: Secondary | ICD-10-CM

## 2021-07-04 ENCOUNTER — Other Ambulatory Visit: Payer: Self-pay | Admitting: Family Medicine

## 2021-07-10 ENCOUNTER — Other Ambulatory Visit: Payer: Self-pay | Admitting: Family Medicine

## 2021-07-10 DIAGNOSIS — I679 Cerebrovascular disease, unspecified: Secondary | ICD-10-CM

## 2021-07-12 NOTE — Progress Notes (Signed)
Cardiology Office Note   Date:  07/14/2021   ID:  [name], DOB [birth year], MRN [id number]  PCP:  Shelda Pal, DO  Cardiologist:   Minus Breeding, MD   Chief Complaint  Patient presents with   Aortic Valve Disease      History of Present Illness: [name] is a 80 y.o. female for follow up of mild AS and AI.  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    Since I last saw her she has done okay.  She is cope with the death of her husband.  She stays busy.  She does yard work.  She has a treadmill and she is going to start getting back on that.  She has had some abdominal discomfort she thinks is related to alendronate.  The patient denies any new symptoms such as chest discomfort, neck or arm discomfort. There has been no new shortness of breath, PND or orthopnea. There have been no reported palpitations, presyncope or syncope.     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 REMAIN UPRIGHT FOR 30 MINUTES AND TAKE WITH 8 OUNCES OF WATER 8 tablet 0   amLODipine (NORVASC) 10 MG tablet TAKE 1 TABLET BY MOUTH DAILY 90 tablet 2   Calcium Carbonate-Vitamin D  (CALCIUM 600+D) 600-400 MG-UNIT tablet Take 2 tablets by mouth daily. 60 tablet 3   cetirizine (ZYRTEC) 5 MG tablet Take 5 mg by mouth as needed.     Cholecalciferol (VITAMIN D) 50 MCG (2000 UT) CAPS Take 1 capsule daily. 30 capsule 0   dipyridamole-aspirin (AGGRENOX) 200-25 MG 12hr capsule TAKE ONE CAPSULE BY MOUTH TWICE A DAY 60 capsule 0   furosemide (LASIX) 20 MG tablet TAKE 1/2 TABLET BY MOUTH DAILY AS NEEDED FOR SWELLING 45 tablet 1   Lactobacillus (PROBIOTIC ACIDOPHILUS PO) Take by mouth daily. Reported on 12/03/2015     levothyroxine (SYNTHROID) 88 MCG tablet Take 88 mcg by mouth daily before breakfast.     levothyroxine (SYNTHROID, LEVOTHROID) 100 MCG tablet Take 88 mcg and 100 mcg alternating days.     metoprolol succinate (TOPROL-XL) 50 MG 24 hr tablet TAKE 1 TABLET BY MOUTH DAILY WITH OR IMMEDIATELY FOLLOWING A MEAL 90 tablet 3   Multiple Vitamins-Minerals (CENTRUM SILVER PO) Take 1 tablet by mouth daily.     simvastatin (ZOCOR) 20 MG tablet TAKE 1 TABLET BY MOUTH AT BEDTIME 90 tablet 3   vitamin E 400 UNIT capsule Take 400 Units by mouth daily.     No current facility-administered medications  for this visit.    Allergies:   Sulfonamide derivatives    ROS:  Please see the history of present illness.   Otherwise, review of systems are positive for headaches.   All other systems are reviewed and negative.    PHYSICAL EXAM: VS:  BP 130/62   Pulse 81   Ht 5' (1.524 m)   Wt 119 lb 6.4 oz (54.2 kg)   SpO2 99%   BMI 23.32 kg/m  , BMI Body mass index is 23.32 kg/m. GENERAL:  Well appearing NECK:  No jugular venous distention, waveform within normal limits, carotid upstroke brisk and symmetric, no bruits, no thyromegaly LUNGS:  Clear to auscultation bilaterally CHEST:  Unremarkable HEART:  PMI not displaced or sustained,S1 and S2 within normal limits, no S3, no S4, no clicks, no rubs, 3 out of 6 apical systolic murmur radiating at the aortic outflow tract and into the carotids,  no diastolic murmurs ABD:  Flat, positive bowel sounds normal in frequency in pitch, no bruits, no rebound, no guarding, no midline pulsatile mass, no hepatomegaly, no splenomegaly EXT:  2 plus pulses throughout, no edema, no cyanosis no clubbing   EKG:  EKG is  ordered today. The ekg ordered today demonstrates normal sinus rhythm, rate 81. Normal axis, intervals.    Recent Labs: No results found for requested labs within last 8760 hours.    Lipid Panel    Component Value Date/Time   CHOL 167 01/05/2020 0826   TRIG 163.0 (H) 01/05/2020 0826   HDL 54.20 01/05/2020 0826   CHOLHDL 3 01/05/2020 0826   VLDL 32.6 01/05/2020 0826   LDLCALC 80 01/05/2020 0826   LDLDIRECT 200.1 02/29/2008 1026      Wt Readings from Last 3 Encounters:  07/14/21 119 lb 6.4 oz (54.2 kg)  11/21/20 121 lb (54.9 kg)  07/11/20 121 lb 12.8 oz (55.2 kg)      Other studies Reviewed: Additional studies/ records that were reviewed today include: Labs Review of the above records demonstrates: See elsewhere   ASSESSMENT AND PLAN:  AS:   This is mild by exam.  I would not expect it to be changed and she is having no symptoms.  No further imaging.  This was mild on echo in July 2020.     AI:   This was mild on the last echo in 2020.  No further imaging.  HTN:   BP is well controlled.  No change in therapy.   HLD:  LDL was 80 with an HDL of 54.  No change in therapy.   THYROID CANCER: She has is followed by Dr. Buddy Duty.     TIA:   She will continue with Aggrenox.  She has had no further neurologic symptoms.   Current medicines are reviewed at length with the patient today.  The patient doe snot have concerns regarding medicines.  The following changes have been made:  None  Labs/ tests ordered today include:   None   Orders Placed This Encounter  Procedures   EKG 12-Lead      Disposition:   FU with me in one year.    Signed, Minus Breeding, MD  07/14/2021 12:26 PM    [facility]

## 2021-07-14 ENCOUNTER — Ambulatory Visit (INDEPENDENT_AMBULATORY_CARE_PROVIDER_SITE_OTHER): Payer: Medicare Other | Admitting: Cardiology

## 2021-07-14 ENCOUNTER — Other Ambulatory Visit: Payer: Self-pay

## 2021-07-14 ENCOUNTER — Encounter: Payer: Self-pay | Admitting: Cardiology

## 2021-07-14 VITALS — BP 130/62 | HR 81 | Ht 60.0 in | Wt 119.4 lb

## 2021-07-14 DIAGNOSIS — I350 Nonrheumatic aortic (valve) stenosis: Secondary | ICD-10-CM

## 2021-07-14 DIAGNOSIS — I10 Essential (primary) hypertension: Secondary | ICD-10-CM | POA: Diagnosis not present

## 2021-07-14 DIAGNOSIS — E785 Hyperlipidemia, unspecified: Secondary | ICD-10-CM | POA: Diagnosis not present

## 2021-07-14 NOTE — Patient Instructions (Signed)

## 2021-07-23 ENCOUNTER — Telehealth: Payer: Self-pay | Admitting: Family Medicine

## 2021-07-23 NOTE — Telephone Encounter (Signed)
No. Medicare won't pay for it. I think its a couple hundred out of pocket. If something happens (she scrapes her knee against some metal), we would give it and it would be covered. Ty.

## 2021-07-23 NOTE — Telephone Encounter (Signed)
Called the patient back informed of PCP instructions. She verbalized understanding.

## 2021-07-23 NOTE — Telephone Encounter (Signed)
Patient called inquiring if she should schedule a Tetanus immunization shot & the price if possible. Please advise.

## 2021-07-28 ENCOUNTER — Other Ambulatory Visit: Payer: Self-pay | Admitting: Family Medicine

## 2021-07-28 DIAGNOSIS — I10 Essential (primary) hypertension: Secondary | ICD-10-CM

## 2021-08-06 DIAGNOSIS — Z1231 Encounter for screening mammogram for malignant neoplasm of breast: Secondary | ICD-10-CM | POA: Diagnosis not present

## 2021-08-08 ENCOUNTER — Other Ambulatory Visit: Payer: Self-pay | Admitting: Family Medicine

## 2021-08-08 DIAGNOSIS — I679 Cerebrovascular disease, unspecified: Secondary | ICD-10-CM

## 2021-08-11 DIAGNOSIS — R7303 Prediabetes: Secondary | ICD-10-CM | POA: Diagnosis not present

## 2021-08-11 DIAGNOSIS — E890 Postprocedural hypothyroidism: Secondary | ICD-10-CM | POA: Diagnosis not present

## 2021-08-11 DIAGNOSIS — Z833 Family history of diabetes mellitus: Secondary | ICD-10-CM | POA: Diagnosis not present

## 2021-08-11 DIAGNOSIS — Z85850 Personal history of malignant neoplasm of thyroid: Secondary | ICD-10-CM | POA: Diagnosis not present

## 2021-08-11 DIAGNOSIS — R7309 Other abnormal glucose: Secondary | ICD-10-CM | POA: Diagnosis not present

## 2021-08-13 ENCOUNTER — Other Ambulatory Visit: Payer: Self-pay

## 2021-08-13 ENCOUNTER — Ambulatory Visit (INDEPENDENT_AMBULATORY_CARE_PROVIDER_SITE_OTHER): Payer: Medicare Other

## 2021-08-13 DIAGNOSIS — Z23 Encounter for immunization: Secondary | ICD-10-CM

## 2021-08-14 DIAGNOSIS — E890 Postprocedural hypothyroidism: Secondary | ICD-10-CM | POA: Diagnosis not present

## 2021-08-14 DIAGNOSIS — R7303 Prediabetes: Secondary | ICD-10-CM | POA: Diagnosis not present

## 2021-08-14 DIAGNOSIS — Z833 Family history of diabetes mellitus: Secondary | ICD-10-CM | POA: Diagnosis not present

## 2021-08-14 DIAGNOSIS — R21 Rash and other nonspecific skin eruption: Secondary | ICD-10-CM | POA: Diagnosis not present

## 2021-08-14 DIAGNOSIS — Z85850 Personal history of malignant neoplasm of thyroid: Secondary | ICD-10-CM | POA: Diagnosis not present

## 2021-08-25 ENCOUNTER — Other Ambulatory Visit: Payer: Self-pay | Admitting: Family Medicine

## 2021-08-26 DIAGNOSIS — Z85828 Personal history of other malignant neoplasm of skin: Secondary | ICD-10-CM | POA: Diagnosis not present

## 2021-08-26 DIAGNOSIS — Z08 Encounter for follow-up examination after completed treatment for malignant neoplasm: Secondary | ICD-10-CM | POA: Diagnosis not present

## 2021-08-26 DIAGNOSIS — L2389 Allergic contact dermatitis due to other agents: Secondary | ICD-10-CM | POA: Diagnosis not present

## 2021-08-29 ENCOUNTER — Other Ambulatory Visit: Payer: Self-pay | Admitting: Family Medicine

## 2021-09-08 ENCOUNTER — Other Ambulatory Visit: Payer: Self-pay | Admitting: Family Medicine

## 2021-09-08 DIAGNOSIS — I679 Cerebrovascular disease, unspecified: Secondary | ICD-10-CM

## 2021-09-21 ENCOUNTER — Other Ambulatory Visit: Payer: Self-pay | Admitting: Family Medicine

## 2021-10-06 ENCOUNTER — Other Ambulatory Visit: Payer: Self-pay | Admitting: Family Medicine

## 2021-10-06 DIAGNOSIS — I679 Cerebrovascular disease, unspecified: Secondary | ICD-10-CM

## 2021-10-13 DIAGNOSIS — E890 Postprocedural hypothyroidism: Secondary | ICD-10-CM | POA: Diagnosis not present

## 2021-10-18 ENCOUNTER — Other Ambulatory Visit: Payer: Self-pay | Admitting: Family Medicine

## 2021-10-21 ENCOUNTER — Other Ambulatory Visit: Payer: Self-pay | Admitting: Family Medicine

## 2021-11-05 ENCOUNTER — Other Ambulatory Visit: Payer: Self-pay | Admitting: Family Medicine

## 2021-11-05 DIAGNOSIS — I679 Cerebrovascular disease, unspecified: Secondary | ICD-10-CM

## 2021-11-16 ENCOUNTER — Other Ambulatory Visit: Payer: Self-pay | Admitting: Family Medicine

## 2021-11-22 DIAGNOSIS — M545 Low back pain, unspecified: Secondary | ICD-10-CM | POA: Diagnosis not present

## 2021-11-22 DIAGNOSIS — R350 Frequency of micturition: Secondary | ICD-10-CM | POA: Diagnosis not present

## 2021-12-04 ENCOUNTER — Other Ambulatory Visit: Payer: Self-pay | Admitting: Family Medicine

## 2021-12-04 DIAGNOSIS — I679 Cerebrovascular disease, unspecified: Secondary | ICD-10-CM

## 2021-12-15 ENCOUNTER — Other Ambulatory Visit: Payer: Self-pay | Admitting: Family Medicine

## 2021-12-19 ENCOUNTER — Other Ambulatory Visit: Payer: Self-pay | Admitting: Family Medicine

## 2022-01-02 ENCOUNTER — Other Ambulatory Visit: Payer: Self-pay | Admitting: Family Medicine

## 2022-01-02 DIAGNOSIS — I679 Cerebrovascular disease, unspecified: Secondary | ICD-10-CM

## 2022-01-12 ENCOUNTER — Other Ambulatory Visit: Payer: Self-pay | Admitting: Family Medicine

## 2022-01-30 ENCOUNTER — Ambulatory Visit (INDEPENDENT_AMBULATORY_CARE_PROVIDER_SITE_OTHER): Payer: Medicare Other

## 2022-01-30 VITALS — BP 140/66 | HR 74 | Ht 60.0 in | Wt 119.0 lb

## 2022-01-30 DIAGNOSIS — M810 Age-related osteoporosis without current pathological fracture: Secondary | ICD-10-CM | POA: Diagnosis not present

## 2022-01-30 DIAGNOSIS — Z0000 Encounter for general adult medical examination without abnormal findings: Secondary | ICD-10-CM

## 2022-01-30 DIAGNOSIS — Z85850 Personal history of malignant neoplasm of thyroid: Secondary | ICD-10-CM | POA: Insufficient documentation

## 2022-01-30 DIAGNOSIS — R7303 Prediabetes: Secondary | ICD-10-CM | POA: Insufficient documentation

## 2022-01-30 NOTE — Progress Notes (Signed)
? ?Subjective:  ? [name] is a 81 y.o. female who presents for Medicare Annual (Subsequent) preventive examination. ?Virtual Visit via Telephone Note ? ?I connected with  [name] on 01/30/22 at  9:30 AM EDT by telephone and verified that I am speaking with the correct person using two identifiers. ? ?Location: ?Patient: HOME ?Provider: [facility] ?Persons participating in the virtual visit: patient/Nurse Health Advisor ?  ?I discussed the limitations, risks, security and privacy concerns of performing an evaluation and management service by telephone and the availability of in person appointments. The patient expressed understanding and agreed to proceed. ? ?Interactive audio and video telecommunications were attempted between this nurse and patient, however failed, due to patient having technical difficulties OR patient did not have access to video capability.  We continued and completed visit with audio only. ? ?Some vital signs may be absent or patient reported.  ? ?Katherine Driver, LPN ? ?Review of Systems    ? ?Cardiac Risk Factors include: advanced age (>44mn, >>83women);hypertension;dyslipidemia;sedentary lifestyle ? ?   ?Objective:  ?  ?Today's Vitals  ? 01/30/22 0929  ?BP: 140/66  ?Pulse: 74  ?Weight: 119 lb (54 kg)  ?Height: 5' (1.524 m)  ? ?Body mass index is 23.24 kg/m?. ? ? ?  01/30/2022  ?  9:42 AM 11/21/2020  ?  3:24 PM 11/14/2019  ? 10:07 AM  ?Advanced Directives  ?Does Patient Have a Medical Advance Directive? Yes Yes Yes  ?Type of [place]amedicof [place] will [place] will [place] will  ?Does patient want to make changes to medical advance directive?   No - Patient declined  ?Copy of HM[place]ourin Chart? Yes - validated most recent copy scanned in chart (See row information) Yes - validated most recent copy scanned in chart (See row information) Yes - validated most recent copy  scanned in chart (See row information)  ? ? ?Current Medications (verified) ?Outpatient Encounter Medications as of 01/30/2022  ?Medication Sig  ? alendronate (FOSAMAX) 70 MG tablet TAKE 1 TABLET BY MOUTH ONCE WEEKLY ON AN EMPTY STOMACH BEFORE BREAKFAST. REMAIN UPRIGHT FOR 30 MINUTES AND TAKE WITH 8 OUNCES OF WATER  ? amLODipine (NORVASC) 10 MG tablet TAKE ONE TABLET BY MOUTH DAILY  ? Calcium Carb-Cholecalciferol 600-5 MG-MCG TABS 1 tablets  ? cetirizine (ZYRTEC) 5 MG tablet Take 5 mg by mouth as needed.  ? Cholecalciferol (VITAMIN D) 50 MCG (2000 UT) CAPS Take 1 capsule daily.  ? dipyridamole-aspirin (AGGRENOX) 200-25 MG 12hr capsule TAKE ONE CAPSULE BY MOUTH TWICE A DAY  ? furosemide (LASIX) 20 MG tablet TAKE 1/2 TABLET BY MOUTH DAILY AS NEEDED FOR SWELLING  ? Lactobacillus (PROBIOTIC ACIDOPHILUS PO) Take by mouth daily. Reported on 12/03/2015  ? levothyroxine (SYNTHROID) 88 MCG tablet Take 88 mcg by mouth daily before breakfast.  ? levothyroxine (SYNTHROID, LEVOTHROID) 100 MCG tablet Take 88 mcg and 100 mcg alternating days.  ? metoprolol succinate (TOPROL-XL) 50 MG 24 hr tablet TAKE 1 TABLET BY MOUTH DAILY WITH OR IMMEDIATELY FOLLOWING A MEAL  ? Multiple Vitamins-Minerals (CENTRUM SILVER PO) Take 1 tablet by mouth daily.  ? simvastatin (ZOCOR) 20 MG tablet TAKE 1 TABLET BY MOUTH AT BEDTIME  ? vitamin E 400 UNIT capsule Take 400 Units by mouth daily.  ? [DISCONTINUED] Calcium Carbonate-Vitamin D (CALCIUM 600+D) 600-400 MG-UNIT tablet Take 2 tablets by mouth daily.  ? [DISCONTINUED] Cholecalciferol (VITAMIN D3) 50 MCG (2000 UT) capsule  1 capsule  ? [DISCONTINUED] levothyroxine (SYNTHROID) 88 MCG tablet 1 tablet on an empty stomach  ? ?No facility-administered encounter medications on file as of 01/30/2022.  ? ? ?Allergies (verified) ?Sulfonamide derivatives  ? ?History: ?Past Medical History:  ?Diagnosis Date  ? Allergic rhinitis, cause unspecified   ? Aortic stenosis   ? Benign neoplasm of colon   ?  Hypercholesteremia   ? Lumbago   ? Malignant neoplasm of thyroid gland ([place])   ? Mitral regurgitation   ? Other iatrogenic hypothyroidism   ? Skin cancer   ? basal cell cancers  ? Unspecified essential hypertension   ? Unspecified transient cerebral ischemia   ? ?Past Surgical History:  ?Procedure Laterality Date  ? 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 june 2015  ? COLONOSCOPY    ? DILATION AND CURETTAGE OF UTERUS    ? EYE SURGERY  08/22/2014  ? POLYPECTOMY    ? TOTAL THYROIDECTOMY    ? ?Family History  ?Problem Relation Age of Onset  ? Diabetes Mother   ? Congestive Heart Failure Mother   ? Heart disease Mother   ? Hypertension Mother   ? Kidney disease Mother   ? Hypertension Father   ? CVA Father 46  ? Early death Father   ? Stroke Father   ? CAD Brother   ?     stents placed, CABG  ? Cancer Brother   ? Diabetes Brother   ? Hyperlipidemia Brother   ? Hypertension Brother   ? Prostate cancer Brother   ? Cancer Brother   ? Diabetes Brother   ? Early death Brother   ? Hypertension Brother   ? Hyperlipidemia Brother   ? Lung cancer Brother   ? Cancer Brother   ? Early death Brother   ? Hyperlipidemia Brother   ? Hypertension Brother   ? Bone cancer Brother   ? Colon polyps Sister   ?     x 3   ? Diabetes Sister   ? Colon cancer Neg Hx   ? Esophageal cancer Neg Hx   ? Stomach cancer Neg Hx   ? ?Social History  ? ?Socioeconomic History  ? Marital status: Widowed  ?  Spouse name: Zephyra Bernardi  ? Number of children: 0  ? Years of education: Not on file  ? Highest education level: Not on file  ?Occupational History  ? Occupation: Retired  ?  Comment: data entry from [place]  ?Tobacco Use  ? Smoking status: Never  ? Smokeless tobacco: Never  ?Vaping Use  ? Vaping Use: Never used  ?Substance and Sexual Activity  ? Alcohol use: No  ?  Alcohol/week: 0.0 standard drinks  ? Drug use: No  ? Sexual activity: Not Currently  ?Other Topics Concern  ? Not on file  ?Social History Narrative  ? Widow  since 09/2018.   ? Lost Brother in Jan 2019 and 2 sisters in Dec 2019.  ? 2 sister living.   ? ?Social Determinants of Health  ? ?Financial Resource Strain: Low Risk   ? Difficulty of Paying Living Expenses: Not hard at all  ?Food Insecurity: No Food Insecurity  ? Worried About Charity fundraiser in the Last Year: Never true  ? Ran Out of Food in the Last Year: Never true  ?Transportation Needs: No Transportation Needs  ? Lack of Transportation (Medical): No  ? Lack of Transportation (Non-Medical): No  ?Physical Activity: Sufficiently Active  ?  Days of Exercise per Week: 5 days  ? Minutes of Exercise per Session: 30 min  ?Stress: Stress Concern Present  ? Feeling of Stress : To some extent  ?Social Connections: Moderately Integrated  ? Frequency of Communication with Friends and Family: More than three times a week  ? Frequency of Social Gatherings with Friends and Family: More than three times a week  ? Attends Religious Services: More than 4 times per year  ? Active Member of Clubs or Organizations: Yes  ? Attends Archivist Meetings: More than 4 times per year  ? Marital Status: Widowed  ? ? ?Tobacco Counseling ?Counseling given: Not Answered ? ? ?Clinical Intake: ? ?Pre-visit preparation completed: Yes ? ?Pain : No/denies pain ? ?  ? ?BMI - recorded: 23.24 ?Nutritional Status: BMI of 19-24  Normal ?Nutritional Risks: None ?Diabetes: No ? ?How often do you need to have someone help you when you read instructions, pamphlets, or other written materials from your doctor or pharmacy?: 1 - Never ? ?Diabetic?NO ? ?Interpreter Needed?: No ? ?Information entered by :: mj Kimora Stankovic, lpn ? ? ?Activities of Daily Living ? ?  01/30/2022  ?  9:45 AM  ?In your present state of health, do you have any difficulty performing the following activities:  ?Hearing? 0  ?Vision? 0  ?Difficulty concentrating or making decisions? 1  ?Comment Memory at times  ?Walking or climbing stairs? 0  ?Dressing or bathing? 0  ?Doing errands,  shopping? 0  ?Preparing Food and eating ? N  ?Using the Toilet? N  ?In the past six months, have you accidently leaked urine? Y  ?Comment Stress.  ?Do you have problems with loss of bowel control? N  ?Managing your Medic

## 2022-01-30 NOTE — Patient Instructions (Signed)
Ms. Wengert , ?Thank you for taking time to come for your Medicare Wellness Visit. I appreciate your ongoing commitment to your health goals. Please review the following plan we discussed and let me know if I can assist you in the future.  ? ?Screening recommendations/referrals: ?Colonoscopy: Done 04/12/2017 No longer required due to age.  ?Mammogram: Done 08/06/2021 Repeat annually ? ?Bone Density: Ordered 01/30/2022. ? ?Recommended yearly ophthalmology/optometry visit for glaucoma screening and checkup ?Recommended yearly dental visit for hygiene and checkup ? ?Vaccinations: ?Influenza vaccine: Done 08/13/2021 Repeat annually ? ?Pneumococcal vaccine: Done 05/24/2014, 11/02/2006 ?Tdap vaccine: Done 07/29/2021 Repeat in 10 years ? ?Shingles vaccine: Done 11/12/2017, 11/01/2017, 11/02/2010.   ?Covid-19:Done 09/04/2020, 12/12/2019, 11/21/2019. ? ?Advanced directives: Copy in chart. ? ?Conditions/risks identified: Aim for 30 minutes of exercise or brisk walking, 6-8 glasses of water, and 5 servings of fruits and vegetables each day. ?KEEP UP THE GOOD WORK!! ? ?Next appointment: Follow up in one year for your annual wellness visit 2024. ? ? ?Preventive Care 64 Years and Older, Female ?Preventive care refers to lifestyle choices and visits with your health care provider that can promote health and wellness. ?What does preventive care include? ?A yearly physical exam. This is also called an annual well check. ?Dental exams once or twice a year. ?Routine eye exams. Ask your health care provider how often you should have your eyes checked. ?Personal lifestyle choices, including: ?Daily care of your teeth and gums. ?Regular physical activity. ?Eating a healthy diet. ?Avoiding tobacco and drug use. ?Limiting alcohol use. ?Practicing safe sex. ?Taking low-dose aspirin every day. ?Taking vitamin and mineral supplements as recommended by your health care provider. ?What happens during an annual well check? ?The services and screenings done by  your health care provider during your annual well check will depend on your age, overall health, lifestyle risk factors, and family history of disease. ?Counseling  ?Your health care provider may ask you questions about your: ?Alcohol use. ?Tobacco use. ?Drug use. ?Emotional well-being. ?Home and relationship well-being. ?Sexual activity. ?Eating habits. ?History of falls. ?Memory and ability to understand (cognition). ?Work and work Statistician. ?Reproductive health. ?Screening  ?You may have the following tests or measurements: ?Height, weight, and BMI. ?Blood pressure. ?Lipid and cholesterol levels. These may be checked every 5 years, or more frequently if you are over 37 years old. ?Skin check. ?Lung cancer screening. You may have this screening every year starting at age 27 if you have a 30-pack-year history of smoking and currently smoke or have quit within the past 15 years. ?Fecal occult blood test (FOBT) of the stool. You may have this test every year starting at age 46. ?Flexible sigmoidoscopy or colonoscopy. You may have a sigmoidoscopy every 5 years or a colonoscopy every 10 years starting at age 9. ?Hepatitis C blood test. ?Hepatitis B blood test. ?Sexually transmitted disease (STD) testing. ?Diabetes screening. This is done by checking your blood sugar (glucose) after you have not eaten for a while (fasting). You may have this done every 1-3 years. ?Bone density scan. This is done to screen for osteoporosis. You may have this done starting at age 41. ?Mammogram. This may be done every 1-2 years. Talk to your health care provider about how often you should have regular mammograms. ?Talk with your health care provider about your test results, treatment options, and if necessary, the need for more tests. ?Vaccines  ?Your health care provider may recommend certain vaccines, such as: ?Influenza vaccine. This is recommended every year. ?Tetanus,  diphtheria, and acellular pertussis (Tdap, Td) vaccine. You  may need a Td booster every 10 years. ?Zoster vaccine. You may need this after age 55. ?Pneumococcal 13-valent conjugate (PCV13) vaccine. One dose is recommended after age 64. ?Pneumococcal polysaccharide (PPSV23) vaccine. One dose is recommended after age 40. ?Talk to your health care provider about which screenings and vaccines you need and how often you need them. ?This information is not intended to replace advice given to you by your health care provider. Make sure you discuss any questions you have with your health care provider. ?Document Released: 11/15/2015 Document Revised: 07/08/2016 Document Reviewed: 08/20/2015 ?Elsevier Interactive Patient Education ? 2017 [place]. ? ?Fall Prevention in the Home ?Falls can cause injuries. They can happen to people of all ages. There are many things you can do to make your home safe and to help prevent falls. ?What can I do on the outside of my home? ?Regularly fix the edges of walkways and driveways and fix any cracks. ?Remove anything that might make you trip as you walk through a door, such as a raised step or threshold. ?Trim any bushes or trees on the path to your home. ?Use bright outdoor lighting. ?Clear any walking paths of anything that might make someone trip, such as rocks or tools. ?Regularly check to see if handrails are loose or broken. Make sure that both sides of any steps have handrails. ?Any raised decks and porches should have guardrails on the edges. ?Have any leaves, snow, or ice cleared regularly. ?Use sand or salt on walking paths during winter. ?Clean up any spills in your garage right away. This includes oil or grease spills. ?What can I do in the bathroom? ?Use night lights. ?Install grab bars by the toilet and in the tub and shower. Do not use towel bars as grab bars. ?Use non-skid mats or decals in the tub or shower. ?If you need to sit down in the shower, use a plastic, non-slip stool. ?Keep the floor dry. Clean up any water that spills  on the floor as soon as it happens. ?Remove soap buildup in the tub or shower regularly. ?Attach bath mats securely with double-sided non-slip rug tape. ?Do not have throw rugs and other things on the floor that can make you trip. ?What can I do in the bedroom? ?Use night lights. ?Make sure that you have a light by your bed that is easy to reach. ?Do not use any sheets or blankets that are too big for your bed. They should not hang down onto the floor. ?Have a firm chair that has side arms. You can use this for support while you get dressed. ?Do not have throw rugs and other things on the floor that can make you trip. ?What can I do in the kitchen? ?Clean up any spills right away. ?Avoid walking on wet floors. ?Keep items that you use a lot in easy-to-reach places. ?If you need to reach something above you, use a strong step stool that has a grab bar. ?Keep electrical cords out of the way. ?Do not use floor polish or wax that makes floors slippery. If you must use wax, use non-skid floor wax. ?Do not have throw rugs and other things on the floor that can make you trip. ?What can I do with my stairs? ?Do not leave any items on the stairs. ?Make sure that there are handrails on both sides of the stairs and use them. Fix handrails that are broken or loose. Make  sure that handrails are as long as the stairways. ?Check any carpeting to make sure that it is firmly attached to the stairs. Fix any carpet that is loose or worn. ?Avoid having throw rugs at the top or bottom of the stairs. If you do have throw rugs, attach them to the floor with carpet tape. ?Make sure that you have a light switch at the top of the stairs and the bottom of the stairs. If you do not have them, ask someone to add them for you. ?What else can I do to help prevent falls? ?Wear shoes that: ?Do not have high heels. ?Have rubber bottoms. ?Are comfortable and fit you well. ?Are closed at the toe. Do not wear sandals. ?If you use a stepladder: ?Make  sure that it is fully opened. Do not climb a closed stepladder. ?Make sure that both sides of the stepladder are locked into place. ?Ask someone to hold it for you, if possible. ?Clearly mark and make su

## 2022-02-03 ENCOUNTER — Other Ambulatory Visit: Payer: Self-pay | Admitting: Family Medicine

## 2022-02-03 DIAGNOSIS — I679 Cerebrovascular disease, unspecified: Secondary | ICD-10-CM

## 2022-02-03 MED ORDER — ASPIRIN-DIPYRIDAMOLE ER 25-200 MG PO CP12: 1.0000 | ORAL_CAPSULE | Freq: Two times a day (BID) | ORAL | 2 refills | Status: DC

## 2022-02-10 ENCOUNTER — Ambulatory Visit (INDEPENDENT_AMBULATORY_CARE_PROVIDER_SITE_OTHER): Payer: Medicare Other | Admitting: Family Medicine

## 2022-02-10 ENCOUNTER — Encounter: Payer: Self-pay | Admitting: Family Medicine

## 2022-02-10 ENCOUNTER — Telehealth: Payer: Self-pay | Admitting: Family Medicine

## 2022-02-10 VITALS — BP 120/78 | HR 62 | Temp 98.4°F | Ht 65.0 in | Wt 118.2 lb

## 2022-02-10 DIAGNOSIS — E782 Mixed hyperlipidemia: Secondary | ICD-10-CM

## 2022-02-10 DIAGNOSIS — I10 Essential (primary) hypertension: Secondary | ICD-10-CM | POA: Diagnosis not present

## 2022-02-10 DIAGNOSIS — M545 Low back pain, unspecified: Secondary | ICD-10-CM

## 2022-02-10 MED ORDER — SIMVASTATIN 20 MG PO TABS: 20.0000 mg | ORAL_TABLET | Freq: Every day | ORAL | 3 refills | Status: DC

## 2022-02-10 MED ORDER — METHYLPREDNISOLONE ACETATE 80 MG/ML IJ SUSP
80.0000 mg | Freq: Once | INTRAMUSCULAR | Status: AC
  Administered 2022-02-10: 80 mg via INTRAMUSCULAR

## 2022-02-10 MED ORDER — FUROSEMIDE 20 MG PO TABS: ORAL_TABLET | ORAL | 2 refills | Status: DC

## 2022-02-10 MED ORDER — METOPROLOL SUCCINATE ER 50 MG PO TB24: ORAL_TABLET | ORAL | 3 refills | Status: DC

## 2022-02-10 NOTE — Telephone Encounter (Signed)
Patient informed of PCP response to her questions. ?

## 2022-02-10 NOTE — Patient Instructions (Signed)
Give us 2-3 business days to get the results of your labs back.   Keep the diet clean and stay active.  Let us know if you need anything. 

## 2022-02-10 NOTE — Progress Notes (Signed)
Chief Complaint  ?Patient presents with  ? Annual Exam  ? ? ?Subjective ?[name] is a 81 y.o. female who presents for hypertension follow up. ?She does monitor home blood pressures. ?Blood pressures ranging from 120's/70's on average. ?She is compliant with medications- Toprol XL 50 mg/d, Norvasc 10 mg daily. ?Patient has these side effects of medication: none ?She is usually adhering to a healthy diet overall. ?Current exercise: active in yard, walking ?No CP or SOB. ? ?Hyperlipidemia ?Patient presents for dyslipidemia follow up. ?Currently being treated with Zocor 20 mg/d and compliance with treatment thus far has been good. ?She denies myalgias. ?Diet/exercise as above.  ?The patient is not known to have coexisting coronary artery disease. ? ?Patient with a history of a herniated lumbar disc requiring back injections in the past.  She overdid it in the lawn and is having some pain.  No neurologic signs or symptoms, no loss of control of bowel or bladder function. ?  ?Past Medical History:  ?Diagnosis Date  ? Allergic rhinitis, cause unspecified   ? Aortic stenosis   ? Benign neoplasm of colon   ? Hypercholesteremia   ? Lumbago   ? 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 Mitral regurgitation   ? Other iatrogenic hypothyroidism   ? Skin cancer   ? basal cell cancers  ? Unspecified essential hypertension   ? Unspecified transient cerebral ischemia   ? ? ?Exam ?BP 120/78   Pulse 62   Temp 98.4 ?F (36.9 ?C) (Oral)   Ht '5\' 5"'$  (1.651 m)   Wt 118 lb 4 oz (53.6 kg)   SpO2 94%   BMI 19.68 kg/m?  ?General:  well developed, well nourished, in no apparent distress ?Heart: RRR, no bruits, no LE edema ?Lungs: clear to auscultation, no accessory muscle use ?Psych: well oriented with normal range of affect and appropriate judgment/insight ? ?Mixed hyperlipidemia - Plan: simvastatin (ZOCOR) 20 MG tablet, Comprehensive metabolic panel, Lipid panel ? ?Essential hypertension - Plan: metoprolol succinate  (TOPROL-XL) 50 MG 24 hr tablet ? ?Chronic, stable.  Continue Zocor 20 mg daily.  Counseled on diet and exercise.  Return for fasting labs at convenience.  ?Chronic, stable.  Continue metoprolol succinate 50 mg daily, Norvasc 10 mg daily. ?For her back, we gave her Depo injection today.  May need to follow-up with specialist who provided injections if no improvement. ?F/u in 6 months or as needed. ?The patient voiced understanding and agreement to the plan. ? ?Shelda Pal, DO ?02/10/22  ?1:53 PM ? ?

## 2022-02-10 NOTE — Telephone Encounter (Signed)
Wait for next CDC recommendation. Consider chewing gum, staying hydrated. There is medication if all else fails.  ?

## 2022-02-10 NOTE — Telephone Encounter (Signed)
The patient forgot to ask if she should get another Covid Vaccine? ?Also she is still having dry mouth problems and she does take biotene at night, any other options for her. ?

## 2022-02-11 ENCOUNTER — Other Ambulatory Visit: Payer: Self-pay | Admitting: Family Medicine

## 2022-02-18 ENCOUNTER — Other Ambulatory Visit (INDEPENDENT_AMBULATORY_CARE_PROVIDER_SITE_OTHER): Payer: Medicare Other

## 2022-02-18 DIAGNOSIS — E782 Mixed hyperlipidemia: Secondary | ICD-10-CM

## 2022-02-18 LAB — COMPREHENSIVE METABOLIC PANEL
ALT: 17 U/L (ref 0–35)
AST: 17 U/L (ref 0–37)
Albumin: 4.7 g/dL (ref 3.5–5.2)
Alkaline Phosphatase: 53 U/L (ref 39–117)
BUN: 15 mg/dL (ref 6–23)
CO2: 30 mEq/L (ref 19–32)
Calcium: 9.4 mg/dL (ref 8.4–10.5)
Chloride: 99 mEq/L (ref 96–112)
Creatinine, Ser: 0.59 mg/dL (ref 0.40–1.20)
GFR: 84.14 mL/min (ref >60.00)
Glucose, Bld: 101 mg/dL — ABNORMAL HIGH (ref 70–99)
Potassium: 4.2 mEq/L (ref 3.5–5.1)
Sodium: 136 mEq/L (ref 135–145)
Total Bilirubin: 0.6 mg/dL (ref 0.2–1.2)
Total Protein: 7.3 g/dL (ref 6.0–8.3)

## 2022-02-18 LAB — LIPID PANEL
Cholesterol: 166 mg/dL (ref 0–200)
HDL: 63.3 mg/dL (ref >39.00)
LDL Cholesterol: 82 mg/dL (ref 0–99)
NonHDL: 102.82
Total CHOL/HDL Ratio: 3
Triglycerides: 106 mg/dL (ref 0.0–149.0)
VLDL: 21.2 mg/dL (ref 0.0–40.0)

## 2022-04-21 ENCOUNTER — Other Ambulatory Visit: Payer: Self-pay | Admitting: Family Medicine

## 2022-04-21 DIAGNOSIS — I10 Essential (primary) hypertension: Secondary | ICD-10-CM

## 2022-04-29 ENCOUNTER — Ambulatory Visit (INDEPENDENT_AMBULATORY_CARE_PROVIDER_SITE_OTHER): Payer: Medicare Other | Admitting: Family

## 2022-04-29 VITALS — BP 144/78 | HR 84 | Temp 98.5°F | Resp 16 | Wt 115.0 lb

## 2022-04-29 DIAGNOSIS — F419 Anxiety disorder, unspecified: Secondary | ICD-10-CM | POA: Insufficient documentation

## 2022-04-29 MED ORDER — SERTRALINE HCL 50 MG PO TABS: ORAL_TABLET | ORAL | 0 refills | Status: DC

## 2022-04-29 NOTE — Patient Instructions (Signed)
Please start zoloft '50mg'$  1/2 tab once daily for 1 week, then increase to a full tab once daily on week two.

## 2022-04-29 NOTE — Progress Notes (Signed)
Subjective:   By signing my name below, I, Carylon Perches, attest that this documentation has been prepared under the direction and in the presence of Karie Chimera, NP 04/29/2022    Patient ID: [name], female    DOB: [birth year], 81 y.o.   MRN: [id number]  Chief Complaint  Patient presents with   Insomnia    Having trouble staying sleep    HPI Patient is in today for an office visit  Anxiety/Insomnia: She reports that in 2019 she had increased anxiety attacks due to the loss of of her husband and multiple other family members. She reports that she was treated with medication, but as her mood improved she was tapered off.  In 12/2021 her brother in law passed away. As a result, she has been dealing with increased paper work settling his affairs and is having increased "almost panic attacks" and insomnia.  She was previously taking Zoloft and reports that she has tolerated in the past. She was also previously doing counseling around the time her husband passed away.   There are no preventive care reminders to display for this patient.  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  11/04/2012   june 2015   COLONOSCOPY     DILATION AND CURETTAGE OF UTERUS     EYE SURGERY  08/22/2014   POLYPECTOMY     TOTAL THYROIDECTOMY      Family History  Problem Relation Age of Onset   Diabetes Mother    Congestive Heart Failure Mother    Heart disease Mother    Hypertension Mother    Kidney disease Mother    Hypertension Father     CVA Father 12   Early death Father    Stroke Father    CAD Brother        stents placed, CABG   Cancer Brother    Diabetes Brother    Hyperlipidemia Brother    Hypertension Brother    Prostate cancer Brother    Cancer Brother    Diabetes Brother    Early death Brother    Hypertension Brother    Hyperlipidemia Brother    Lung cancer Brother    Cancer Brother    Early death Brother    Hyperlipidemia Brother    Hypertension Brother    Bone cancer Brother    Colon polyps Sister        x 3    Diabetes Sister    Colon cancer Neg Hx    Esophageal cancer Neg Hx    Stomach cancer Neg Hx     Social History   Socioeconomic History   Marital status: Widowed    Spouse name: Edye Hainline   Number of children: 0   Years of education: Not on file   Highest education level: Not on file  Occupational History   Occupation: Retired    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    Lost Brother in Jan 2019 and 2 sisters in Dec 2019.   2 sister living.    Social Determinants of Health   Financial Resource Strain: Low Risk  (01/30/2022)   Overall Financial Resource Strain (CARDIA)    Difficulty of Paying Living Expenses: Not hard at all  Food Insecurity: No Food Insecurity (01/30/2022)   Hunger Vital Sign    Worried About Running Out of Food in the Last Year: Never true    Ran Out of Food in the Last Year: Never true  Transportation Needs: No Transportation Needs (01/30/2022)   PRAPARE - Hydrologist (Medical): No    Lack of Transportation (Non-Medical): No  Physical Activity: Sufficiently Active (01/30/2022)   Exercise  Vital Sign    Days of Exercise per Week: 5 days    Minutes of Exercise per Session: 30 min  Stress: Stress Concern Present (01/30/2022)   [place]    Feeling of Stress : To some extent  Social Connections: Moderately Integrated (01/30/2022)   Social Connection and Isolation Panel [NHANES]    Frequency of Communication with Friends and Family: More than three times a week    Frequency of Social Gatherings with Friends and Family: More than three times a week    Attends Religious Services: More than 4 times per year    Active Member of [employer] or Organizations: Yes    Attends Archivist Meetings: More than 4 times per year    Marital Status: Widowed  Intimate Partner Violence: Not At Risk (01/30/2022)   Humiliation, Afraid, Rape, and Kick questionnaire    Fear of Current or Ex-Partner: No    Emotionally Abused: No    Physically Abused: No    Sexually Abused: No    Outpatient Medications Prior to Visit  Medication Sig Dispense Refill   alendronate (FOSAMAX) 70 MG tablet TAKE 1 TABLET BY MOUTH ONCE WEEKLY ON AN EMPTY STOMACH BEFORE BREAKFAST. REMAIN UPRIGHT FOR 30 MINUTES AND TAKE WITH 8 OUNCES OF WATER 8 tablet 1   amLODipine (NORVASC) 10 MG tablet TAKE ONE TABLET BY MOUTH DAILY 90 tablet 2   Calcium Carb-Cholecalciferol 600-5 MG-MCG TABS 1 tablets     cetirizine (ZYRTEC) 5 MG tablet Take 5 mg by mouth as needed.     Cholecalciferol (VITAMIN D) 50 MCG (2000 UT) CAPS Take 1 capsule daily. 30 capsule 0   dipyridamole-aspirin (AGGRENOX) 200-25 MG 12hr capsule Take 1 capsule by mouth 2 (two) times daily. 60 capsule 2   furosemide (LASIX) 20 MG tablet Take 1/2 tab daily. 45 tablet 2   Lactobacillus (PROBIOTIC ACIDOPHILUS PO) Take by mouth daily. Reported on 12/03/2015     levothyroxine (SYNTHROID, LEVOTHROID) 100 MCG tablet Take 88 mcg and 100 mcg alternating days.     metoprolol succinate (TOPROL-XL) 50 MG 24 hr tablet  TAKE 1 TABLET BY MOUTH DAILY WITH OR IMMEDIATELY FOLLOWING A MEAL 90 tablet 3   Multiple Vitamins-Minerals (CENTRUM SILVER PO) Take 1 tablet by mouth daily.     simvastatin (ZOCOR) 20 MG tablet Take 1 tablet (20  mg total) by mouth at bedtime. 90 tablet 3   vitamin E 400 UNIT capsule Take 400 Units by mouth daily.     No facility-administered medications prior to visit.    Allergies  Allergen Reactions   Sulfonamide Derivatives     REACTION: rash    Review of Systems  Psychiatric/Behavioral:  The patient is nervous/anxious and has insomnia.        Objective:    Physical Exam Constitutional:      General: She is not in acute distress.    Appearance: Normal appearance. She is not ill-appearing.  HENT:     Head: Normocephalic and atraumatic.     Right Ear: External ear normal.     Left Ear: External ear normal.  Pulmonary:     Effort: Pulmonary effort is normal.  Skin:    General: Skin is warm and dry.  Neurological:     Mental Status: She is alert and oriented to person, place, and time.  Psychiatric:        Mood and Affect: Mood is anxious.        Behavior: Behavior normal.        Judgment: Judgment normal.     BP (!) 144/78 (BP Location: Left Arm, Patient Position: Sitting, Cuff Size: Small)   Pulse 84   Temp 98.5 F (36.9 C) (Oral)   Resp 16   Wt 115 lb (52.2 kg)   SpO2 98%   BMI 19.14 kg/m  Wt Readings from Last 3 Encounters:  04/29/22 115 lb (52.2 kg)  02/10/22 118 lb 4 oz (53.6 kg)  01/30/22 119 lb (54 kg)       Assessment & Plan:   Problem List Items Addressed This Visit       Unprioritized   Anxiety - Primary    Uncontrolled with some "almost panic attacks" and difficulty sleeping.  She reports having done well in the past on sertraline. Will restart sertraline '50mg'$  1/2 tab once daily for 1 week, the increase to a full tab once daily on week two. She has done counseling in the past which she found helpful after her husband died but stopped due to  covid. Encouraged pt to consider re-establishing with her therapist.       Relevant Medications   sertraline (ZOLOFT) 50 MG tablet     Meds ordered this encounter  Medications   sertraline (ZOLOFT) 50 MG tablet    Sig: 1/2 tab once daily for 1 week, then increase to 1 tablet by mouth on week two    Dispense:  30 tablet    Refill:  0    Order Specific Question:   Supervising Provider    Answer:   Mosie Lukes [[id number]]    I, Nance Pear, NP, personally preformed the services described in this documentation.  All medical record entries made by the scribe were at my direction and in my presence.  I have reviewed the chart and discharge instructions (if applicable) and agree that the record reflects my personal performance and is accurate and complete. 04/29/2022   I,Amber Collins,acting as a scribe for Nance Pear, NP.,have documented all relevant documentation on the behalf of Nance Pear, NP,as directed by  Nance Pear, NP while in the presence of Nance Pear, NP.  Nance Pear, NP

## 2022-04-29 NOTE — Assessment & Plan Note (Addendum)
Uncontrolled with some "almost panic attacks" and difficulty sleeping.  She reports having done well in the past on sertraline. Will restart sertraline '50mg'$  1/2 tab once daily for 1 week, the increase to a full tab once daily on week two. She has done counseling in the past which she found helpful after her husband died but stopped due to covid. Encouraged pt to consider re-establishing with her therapist.

## 2022-05-04 ENCOUNTER — Other Ambulatory Visit: Payer: Self-pay | Admitting: Family Medicine

## 2022-05-04 ENCOUNTER — Telehealth: Payer: Self-pay | Admitting: Family Medicine

## 2022-05-04 DIAGNOSIS — I679 Cerebrovascular disease, unspecified: Secondary | ICD-10-CM

## 2022-05-04 NOTE — Telephone Encounter (Signed)
Pt was supposed to wait another couple day before increasing sertraline (ZOLOFT) 50 MG tablet to a full tab, but would like to know if she can start a couple days early. Please advise.

## 2022-05-04 NOTE — Telephone Encounter (Signed)
The patients sister says that she is having some panic attacks/breathing faster. Wanted PCP to be aware and wanted to know if anything she can do.

## 2022-05-04 NOTE — Telephone Encounter (Signed)
Called the patient and scheduled her for 05/06/22 at 11 AM.

## 2022-05-04 NOTE — Telephone Encounter (Signed)
Patient informed of PCP instructions. 

## 2022-05-06 ENCOUNTER — Ambulatory Visit: Payer: Medicare Other | Admitting: Family Medicine

## 2022-05-06 ENCOUNTER — Emergency Department (HOSPITAL_BASED_OUTPATIENT_CLINIC_OR_DEPARTMENT_OTHER)
Admission: EM | Admit: 2022-05-06 | Discharge: 2022-05-06 | Disposition: A | Discharge status: Home / Self Care | Payer: Medicare Other | Attending: Emergency Medicine | Admitting: Emergency Medicine

## 2022-05-06 ENCOUNTER — Encounter (HOSPITAL_BASED_OUTPATIENT_CLINIC_OR_DEPARTMENT_OTHER): Payer: Self-pay | Admitting: Emergency Medicine

## 2022-05-06 ENCOUNTER — Ambulatory Visit (INDEPENDENT_AMBULATORY_CARE_PROVIDER_SITE_OTHER): Payer: Medicare Other | Admitting: Family Medicine

## 2022-05-06 ENCOUNTER — Encounter: Payer: Self-pay | Admitting: Family Medicine

## 2022-05-06 VITALS — BP 122/78 | HR 78 | Temp 98.1°F | Ht 60.0 in | Wt 113.2 lb

## 2022-05-06 DIAGNOSIS — F419 Anxiety disorder, unspecified: Secondary | ICD-10-CM | POA: Diagnosis not present

## 2022-05-06 DIAGNOSIS — G479 Sleep disorder, unspecified: Secondary | ICD-10-CM | POA: Diagnosis not present

## 2022-05-06 DIAGNOSIS — F410 Panic disorder [episodic paroxysmal anxiety] without agoraphobia: Secondary | ICD-10-CM | POA: Insufficient documentation

## 2022-05-06 DIAGNOSIS — L0100 Impetigo, unspecified: Secondary | ICD-10-CM

## 2022-05-06 DIAGNOSIS — G478 Other sleep disorders: Secondary | ICD-10-CM | POA: Insufficient documentation

## 2022-05-06 DIAGNOSIS — R9431 Abnormal electrocardiogram [ECG] [EKG]: Secondary | ICD-10-CM | POA: Diagnosis not present

## 2022-05-06 LAB — CBC WITH DIFFERENTIAL/PLATELET
Abs Immature Granulocytes: 0.05 10*3/uL (ref 0.00–0.07)
Basophils Absolute: 0 10*3/uL (ref 0.0–0.1)
Basophils Relative: 1 %
Eosinophils Absolute: 0 10*3/uL (ref 0.0–0.5)
Eosinophils Relative: 0 %
HCT: 40.5 % (ref 36.0–46.0)
Hemoglobin: 14.2 g/dL (ref 12.0–15.0)
Immature Granulocytes: 1 %
Lymphocytes Relative: 14 %
Lymphs Abs: 1.2 10*3/uL (ref 0.7–4.0)
MCH: 31.1 pg (ref 26.0–34.0)
MCHC: 35.1 g/dL (ref 30.0–36.0)
MCV: 88.8 fL (ref 80.0–100.0)
Monocytes Absolute: 0.8 10*3/uL (ref 0.1–1.0)
Monocytes Relative: 9 %
Neutro Abs: 6.6 10*3/uL (ref 1.7–7.7)
Neutrophils Relative %: 75 %
Platelets: 317 10*3/uL (ref 150–400)
RBC: 4.56 MIL/uL (ref 3.87–5.11)
RDW: 12.8 % (ref 11.5–15.5)
WBC: 8.6 10*3/uL (ref 4.0–10.5)
nRBC: 0 % (ref 0.0–0.2)

## 2022-05-06 LAB — BASIC METABOLIC PANEL
Anion gap: 8 (ref 5–15)
BUN: 12 mg/dL (ref 8–23)
CO2: 24 mmol/L (ref 22–32)
Calcium: 8.9 mg/dL (ref 8.9–10.3)
Chloride: 98 mmol/L (ref 98–111)
Creatinine, Ser: 0.64 mg/dL (ref 0.44–1.00)
GFR, Estimated: >60 mL/min (ref >60)
Glucose, Bld: 131 mg/dL — ABNORMAL HIGH (ref 70–99)
Potassium: 4.2 mmol/L (ref 3.5–5.1)
Sodium: 130 mmol/L — ABNORMAL LOW (ref 135–145)

## 2022-05-06 MED ORDER — LORAZEPAM 1 MG PO TABS
1.0000 mg | ORAL_TABLET | Freq: Once | ORAL | Status: AC
  Administered 2022-05-06: 1 mg via ORAL
  Filled 2022-05-06: qty 1

## 2022-05-06 MED ORDER — LORAZEPAM 0.5 MG PO TABS: 0.2500 mg | ORAL_TABLET | Freq: Three times a day (TID) | ORAL | 1 refills | Status: DC | PRN

## 2022-05-06 MED ORDER — DOXYCYCLINE HYCLATE 100 MG PO TABS: 100.0000 mg | ORAL_TABLET | Freq: Two times a day (BID) | ORAL | 0 refills | Status: AC

## 2022-05-06 NOTE — ED Provider Notes (Signed)
[facility] EMERGENCY DEPARTMENT Provider Note   CSN: [id number] Arrival date & time: 05/06/22  8413     History  Chief Complaint  Patient presents with   Panic Attack    [name] is a 81 y.o. female.  Patient here with difficulty sleeping, anxiety, feels like she is having anxiety attack.  History of high cholesterol.  Patient recently started on Zoloft.  She has been having a hard time sleeping.  She feels like she has been under a lot of stress since the death of her brother-in-law.  She denies any suicidal homicidal ideation.  Nothing makes it worse or better.  Denies any chest pain, shortness of breath, abdominal pain, pain with urination, nausea, vomiting.  Denies any weakness or numbness.  The history is provided by the patient.       Home Medications Prior to Admission medications   Medication Sig Start Date End Date Taking? Authorizing Provider  alendronate (FOSAMAX) 70 MG tablet TAKE 1 TABLET BY MOUTH ONCE WEEKLY ON AN EMPTY STOMACH BEFORE BREAKFAST. REMAIN UPRIGHT FOR 30 MINUTES AND TAKE WITH 8 OUNCES OF WATER 02/12/22   Shelda Pal, DO  amLODipine (NORVASC) 10 MG tablet TAKE ONE TABLET BY MOUTH DAILY 04/21/22   Shelda Pal, DO  Calcium Carb-Cholecalciferol 600-5 MG-MCG TABS 1 tablets    [provider]  cetirizine (ZYRTEC) 5 MG tablet Take 5 mg by mouth as needed.    [provider]  Cholecalciferol (VITAMIN D) 50 MCG (2000 UT) CAPS Take 1 capsule daily. 06/07/20   Shelda Pal, DO  dipyridamole-aspirin (AGGRENOX) 200-25 MG 12hr capsule TAKE ONE CAPSULE BY MOUTH TWICE A DAY 05/04/22   Wendling, Crosby Oyster, DO  furosemide (LASIX) 20 MG tablet Take 1/2 tab daily. 02/10/22   Shelda Pal, DO  Lactobacillus (PROBIOTIC ACIDOPHILUS PO) Take by mouth daily. Reported on 12/03/2015    [provider]  levothyroxine (SYNTHROID, LEVOTHROID) 100 MCG tablet Take 88 mcg and 100 mcg alternating days.  11/09/18   Shelda Pal, DO  metoprolol succinate (TOPROL-XL) 50 MG 24 hr tablet TAKE 1 TABLET BY MOUTH DAILY WITH OR IMMEDIATELY FOLLOWING A MEAL 02/10/22   Wendling, Crosby Oyster, DO  Multiple Vitamins-Minerals (CENTRUM SILVER PO) Take 1 tablet by mouth daily.    [provider]  sertraline (ZOLOFT) 50 MG tablet 1/2 tab once daily for 1 week, then increase to 1 tablet by mouth on week two 04/29/22   Debbrah Alar, NP  simvastatin (ZOCOR) 20 MG tablet Take 1 tablet (20 mg total) by mouth at bedtime. 02/10/22   Shelda Pal, DO  vitamin E 400 UNIT capsule Take 400 Units by mouth daily.    [provider]      Allergies    Sulfonamide derivatives    Review of Systems   Review of Systems  Physical Exam Updated Vital Signs BP 139/76 (BP Location: Left Arm)   Pulse 76   Temp (!) 97.5 F (36.4 C) (Oral)   Resp 20   Ht 5' (1.524 m)   Wt 51.8 kg   SpO2 100%   BMI 22.32 kg/m  Physical Exam Vitals and nursing note reviewed.  Constitutional:      General: She is not in acute distress.    Appearance: She is well-developed. She is not ill-appearing.  HENT:     Head: Normocephalic and atraumatic.     Mouth/Throat:     Mouth: Mucous membranes are moist.  Eyes:  Extraocular Movements: Extraocular movements intact.     Conjunctiva/sclera: Conjunctivae normal.     Pupils: Pupils are equal, round, and reactive to light.  Cardiovascular:     Rate and Rhythm: Normal rate and regular rhythm.     Pulses: Normal pulses.     Heart sounds: Normal heart sounds. No murmur heard. Pulmonary:     Effort: Pulmonary effort is normal. No respiratory distress.     Breath sounds: Normal breath sounds.  Abdominal:     Palpations: Abdomen is soft.     Tenderness: There is no abdominal tenderness.  Musculoskeletal:        General: No swelling.     Cervical back: Normal range of motion and neck supple.  Skin:    General: Skin is warm and dry.     Capillary  Refill: Capillary refill takes less than 2 seconds.  Neurological:     General: No focal deficit present.     Mental Status: She is alert and oriented to person, place, and time.     Cranial Nerves: No cranial nerve deficit.     Sensory: No sensory deficit.     Motor: No weakness.     Coordination: Coordination normal.     Comments: 5+ out of 5 strength throughout, normal sensation, normal speech  Psychiatric:        Mood and Affect: Mood normal.     ED Results / Procedures / Treatments   Labs (all labs ordered are listed, but only abnormal results are displayed) Labs Reviewed  BASIC METABOLIC PANEL - Abnormal; Notable for the following components:      Result Value   Sodium 130 (*)    Glucose, Bld 131 (*)    All other components within normal limits  CBC WITH DIFFERENTIAL/PLATELET    EKG EKG Interpretation  Date/Time:  Wednesday May 06 2022 06:55:09 EDT Ventricular Rate:  73 PR Interval:  146 QRS Duration: 93 QT Interval:  389 QTC Calculation: 429 R Axis:   79 Text Interpretation: Sinus rhythm Confirmed by Ronnald Nian, Sharonann Malbrough ([id number]) on 05/06/2022 6:59:03 AM  Radiology No results found.  Procedures Procedures    Medications Ordered in ED Medications  LORazepam (ATIVAN) tablet 1 mg (1 mg Oral Given 05/06/22 0715)    ED Course/ Medical Decision Making/ A&P                           Medical Decision Making Amount and/or Complexity of Data Reviewed Labs: ordered.  Risk Prescription drug management.   KATHYE CIPRIANI is here with anxiety, difficulty sleeping.  She think she is having a panic attack.  Patient with normal vitals.  EKG per my review and interpretation shows sinus rhythm.  No ischemic changes.  She denies any chest pain, abdominal pain, shortness of breath, weakness, numbness.  She has been having a hard time sleeping here recently.  She attributes her anxiety to dealing with some recent family death.  She was started on Zoloft fairly recently.  Has not had  great improvement with the anxiety/depression yet but suspect that Zoloft has not taken full effect yet.  She denies any suicidal homicidal ideation.  Neurologically she is intact.  Differential diagnosis is likely that this is a panic attack.  I have no concern for stroke or ACS or other acute emergent process.  We will check CBC and BMP to make sure there is no significant anemia or electrolyte abnormality.  We will give  her dose of Ativan.  I have talked with her primary physician Dr. Nani Ravens with whom she is supposed to have an appointment with this morning.  He is happy to see her after some acute stabilization in the ED.  Per my review and interpretation of labs, there is no significant findings.  No significant leukocytosis or anemia or electrolyte abnormality.  Feeling much more relaxed after Ativan.  She is fairly tired as she has not had much sleep especially overnight.  I believe that she is safe for discharge we will have her follow-up with her primary care doctor this morning.  Not sure if benzodiazepines of the best as needed medications for her.  We will have her make some share decision with her primary care doctor about this.  I do not think there is any other acute emergent process going on at this time.  This chart was dictated using voice recognition software.  Despite best efforts to proofread,  errors can occur which can change the documentation meaning.         Final Clinical Impression(s) / ED Diagnoses Final diagnoses:  Sleeping difficulty    Rx / DC Orders ED Discharge Orders     None         Lennice Sites, DO 05/06/22 [phone]

## 2022-05-06 NOTE — Patient Instructions (Signed)
Please consider counseling. Contact [phone] to schedule an appointment or inquire about cost/insurance coverage.  [facility] located at [place], [place], [place].  Phone number = [phone].  Dr. Lennice Sites - Adult Psychiatry.    [facility] located at [place], [place], [place]. Phone number = [phone].   [facility] located at [address] [place], [place].  Phone number = [phone].   [facility] located at [place], [place], [place].  Phone number = [phone].  Coping skills Choose 5 that work for you: Take a deep breath Count to 20 Read a book Do a puzzle Meditate Bake Sing Knit Garden Pray Go outside Call a friend Listen to music Take a walk Color Send a note Take a bath Watch a movie Be alone in a quiet place Pet an animal Visit a friend Journal Exercise Stretch   Let us know if you need anything.

## 2022-05-06 NOTE — Progress Notes (Signed)
Chief Complaint  Patient presents with   Panic Attack    Subjective [name] presents for f/u anxiety/depression.  She is here with her sister.  Pt is currently being treated with Zoloft 50 mg daily which she started 1 week ago.  Reports no improvement since treatment.  She just got back from the ER where she received an oral dose of Ativan which did help. A similar event happened in 2018/01/15 after the death of her sister and spouse.  She did well on Zoloft and Ativan and eventually weaned herself off.  Most recently, her brother-in-law passed away in 12-18-2022.  She was quite close with them. No thoughts of harming self or others. No self-medication with alcohol, prescription drugs or illicit drugs. Pt is not following with a counselor/psychologist.  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HCC)    Mitral regurgitation    Other iatrogenic hypothyroidism    Skin cancer    basal cell cancers   Unspecified essential hypertension    Unspecified transient cerebral ischemia    Allergies as of 05/06/2022       Reactions   Sulfonamide Derivatives    REACTION: rash        Medication List        Accurate as of May 06, 2022  9:44 AM. If you have any questions, ask your nurse or doctor.          alendronate 70 MG tablet Commonly known as: FOSAMAX TAKE 1 TABLET BY MOUTH ONCE WEEKLY ON AN EMPTY STOMACH BEFORE BREAKFAST. REMAIN UPRIGHT FOR 30 MINUTES AND TAKE WITH 8 OUNCES OF WATER   amLODipine 10 MG tablet Commonly known as: NORVASC TAKE ONE TABLET BY MOUTH DAILY   Calcium Carb-Cholecalciferol 600-5 MG-MCG Tabs Take 2 tablets by mouth daily.   CENTRUM SILVER PO Take 1 tablet by mouth daily.   cetirizine 5 MG tablet Commonly known as: ZYRTEC Take 5 mg by mouth as needed.   dipyridamole-aspirin 200-25 MG 12hr  capsule Commonly known as: AGGRENOX TAKE ONE CAPSULE BY MOUTH TWICE A DAY   doxycycline 100 MG tablet Commonly known as: VIBRA-TABS Take 1 tablet (100 mg total) by mouth 2 (two) times daily for 7 days.   furosemide 20 MG tablet Commonly known as: LASIX Take 1/2 tab daily.   levothyroxine 88 MCG tablet Commonly known as: SYNTHROID Take 88 mcg by mouth daily before breakfast. What changed: Another medication with the same name was removed. Continue taking this medication, and follow the directions you see here. Changed by: Shelda Pal, DO   LORazepam 0.5 MG tablet Commonly known as: ATIVAN Take 0.5-1 tablets (0.25-0.5 mg total) by mouth every 8 (eight) hours as needed for anxiety. Started by: Shelda Pal, DO   metoprolol succinate 50 MG 24 hr tablet Commonly known as: TOPROL-XL TAKE 1 TABLET BY MOUTH DAILY WITH OR IMMEDIATELY FOLLOWING A MEAL   PROBIOTIC ACIDOPHILUS PO Take by mouth daily. Reported on 12/03/2015   sertraline 50 MG tablet Commonly known as: ZOLOFT 1/2 tab once daily for 1 week, then increase to 1 tablet by mouth on week two   simvastatin 20 MG tablet Commonly known as: ZOCOR Take 1 tablet (20 mg total) by mouth at bedtime.   Vitamin D 50 MCG (2000 UT) Caps Take 1  capsule daily.   vitamin E 180 MG (400 UNITS) capsule Take 400 Units by mouth daily.        Exam BP 122/78   Pulse 78   Temp 98.1 F (36.7 C) (Oral)   Ht 5' (1.524 m)   Wt 113 lb 4 oz (51.4 kg)   SpO2 96%   BMI 22.12 kg/m  General:  well developed, well nourished, in no apparent distress Skin: Excoriated areas with scaling with posterior scalp centrally without fluctuance, drainage, or erythema. Lungs:  No respiratory distress Psych: well oriented with normal range of affect and age-appropriate judgement/insight, alert and oriented x4.  Assessment and Plan  Anxiety - Plan: LORazepam (ATIVAN) 0.5 MG tablet  Impetigo - Plan: doxycycline (VIBRA-TABS) 100 MG  tablet  Chronic, not controlled.  Continue Zoloft 50 mg daily.  Add Ativan 0.25-0.5 mg 3 times daily as needed.  She did well with this in the past and had no adverse effects.  Counseling information provided.  Recommended she stay as active as possible. Her scalp started breakdown again and she did well with doxycycline, question whether anxiety plays a component. F/u in 2 weeks as originally scheduled. The patient and her sister voiced understanding and agreement to the plan.  [place], DO 05/06/22 9:44 AM

## 2022-05-06 NOTE — Discharge Instructions (Addendum)
Go to your primary care doctor's office and he will see you sooner.

## 2022-05-06 NOTE — ED Triage Notes (Signed)
Pt states she has been having panic attacks and has not been able to sleep   Pt states her brother in law passed away in 2023/01/12 and she has been having to help with his estate stuff and she feels like she may just be stressed out   Pt states she has an appt this morning with her PCP

## 2022-05-07 ENCOUNTER — Telehealth: Payer: Self-pay | Admitting: Family Medicine

## 2022-05-07 NOTE — Telephone Encounter (Signed)
Patient's sister states the ativan is not working and would like a call back to discuss what else can be done. Please advise.   Caffie Damme [phone]

## 2022-05-08 NOTE — Telephone Encounter (Signed)
Called informed the patients sister of PCP instructions. She agreed to inform the patient.  The patient sister stated she is doing better now.

## 2022-05-13 NOTE — Telephone Encounter (Signed)
She has been taking 1/2 ativan in the daytime and a whole one at night. They will try to melatonin tonight and should she take an additional ativan at night?? How much should she take?  An appointment is scheduled for this Friday 05/15/22.

## 2022-05-13 NOTE — Telephone Encounter (Signed)
Called informed the patients sister of PCP instructions. She verbalized understanding.

## 2022-05-13 NOTE — Telephone Encounter (Signed)
Pt's sister states she has taken a turn, and panic attacks are still prevalent. Usually she is fine during the day. At night though, she has to get up to use the restroom and has trouble getting back to sleep, it makes her panic. She would like to know what she can do to help this.

## 2022-05-13 NOTE — Telephone Encounter (Signed)
Take 3-5 mg of melatonin nightly. Could do Ativan if having panic attacks then. May need to move up her f/u appt if still having more issues. Ty.

## 2022-05-15 ENCOUNTER — Ambulatory Visit (INDEPENDENT_AMBULATORY_CARE_PROVIDER_SITE_OTHER): Payer: Medicare Other | Admitting: Family Medicine

## 2022-05-15 ENCOUNTER — Encounter: Payer: Self-pay | Admitting: Family Medicine

## 2022-05-15 DIAGNOSIS — F419 Anxiety disorder, unspecified: Secondary | ICD-10-CM | POA: Diagnosis not present

## 2022-05-15 MED ORDER — SERTRALINE HCL 50 MG PO TABS: 50.0000 mg | ORAL_TABLET | Freq: Every day | ORAL | 0 refills | Status: DC

## 2022-05-15 MED ORDER — LORAZEPAM 0.5 MG PO TABS: 0.2500 mg | ORAL_TABLET | Freq: Four times a day (QID) | ORAL | 1 refills | Status: DC | PRN

## 2022-05-15 NOTE — Patient Instructions (Signed)
Keep taking the Zoloft.  OK to take a full tab of the Ativan when you wake up in the middle of the night.  When you wake up in the morning, get up slowly to gauge your alertness.  Let us know if you need anything.

## 2022-05-15 NOTE — Progress Notes (Signed)
Chief Complaint  Patient presents with   Panic Attack    Subjective [name] presents for f/u anxiety.  She is here with her sister.  Pt is currently being treated with Zoloft 50 mg daily, Ativan 0.25-0.5 mg 3 times daily as needed.  She usually takes a full tab before bed and another half tab after she wakes up to use restroom and has trouble falling back asleep.  The half tab does not seem to be as effective. Reports some improvement with panic attacks since treatment.  She is still nervous overall. No thoughts of harming self or others. No self-medication with alcohol, prescription drugs or illicit drugs. Pt is not following with a counselor/psychologist. She is still not fully over the death of her brother-in-law or the stress of his estate.  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HCC)    Mitral regurgitation    Other iatrogenic hypothyroidism    Skin cancer    basal cell cancers   Unspecified essential hypertension    Unspecified transient cerebral ischemia    Allergies as of 05/15/2022       Reactions   Sulfonamide Derivatives    REACTION: rash        Medication List        Accurate as of May 15, 2022  4:19 PM. If you have any questions, ask your nurse or doctor.          alendronate 70 MG tablet Commonly known as: FOSAMAX TAKE 1 TABLET BY MOUTH ONCE WEEKLY ON AN EMPTY STOMACH BEFORE BREAKFAST. REMAIN UPRIGHT FOR 30 MINUTES AND TAKE WITH 8 OUNCES OF WATER   amLODipine 10 MG tablet Commonly known as: NORVASC TAKE ONE TABLET BY MOUTH DAILY   Calcium Carb-Cholecalciferol 600-5 MG-MCG Tabs Take 2 tablets by mouth daily.   CENTRUM SILVER PO Take 1 tablet by mouth daily.   cetirizine 5 MG tablet Commonly known as: ZYRTEC Take 5 mg by mouth as needed.   dipyridamole-aspirin 200-25 MG 12hr  capsule Commonly known as: AGGRENOX TAKE ONE CAPSULE BY MOUTH TWICE A DAY   furosemide 20 MG tablet Commonly known as: LASIX Take 1/2 tab daily.   levothyroxine 88 MCG tablet Commonly known as: SYNTHROID Take 88 mcg by mouth daily before breakfast.   LORazepam 0.5 MG tablet Commonly known as: ATIVAN Take 0.5-1 tablets (0.25-0.5 mg total) by mouth every 6 (six) hours as needed for anxiety. What changed: when to take this Changed by: Shelda Pal, DO   metoprolol succinate 50 MG 24 hr tablet Commonly known as: TOPROL-XL TAKE 1 TABLET BY MOUTH DAILY WITH OR IMMEDIATELY FOLLOWING A MEAL   PROBIOTIC ACIDOPHILUS PO Take by mouth daily. Reported on 12/03/2015   sertraline 50 MG tablet Commonly known as: ZOLOFT Take 1 tablet (50 mg total) by mouth daily. 1/2 tab once daily for 1 week, then increase to 1 tablet by mouth on week two What changed:  how much to take how to take this when to take this Changed by: Shelda Pal, DO   simvastatin 20 MG tablet Commonly known as: ZOCOR Take 1 tablet (20 mg total) by mouth at bedtime.   Vitamin D 50 MCG (2000 UT) Caps Take 1 capsule daily.   vitamin E 180 MG (400 UNITS) capsule Take 400 Units  by mouth daily.        Exam BP (!) 142/70   Pulse 76   Temp 97.9 F (36.6 C) (Oral)   Ht 5' (1.524 m)   Wt 116 lb 2 oz (52.7 kg)   SpO2 98%   BMI 22.68 kg/m  General:  well developed, well nourished, in no apparent distress Heart: RRR Lungs:  CTAB. No respiratory distress Psych: well oriented with normal range of affect and age-appropriate judgement/insight, alert and oriented x4.  Assessment and Plan  Anxiety - Plan: LORazepam (ATIVAN) 0.5 MG tablet  Chronic, not fully controlled.  Increase doses of Ativan to 0.5 mg 4 times daily as needed.  She will increase her nighttime dose from 0.25 mg to 0.5 mg.  She will get up slowly to monitor for grogginess.  We will continue Zoloft 50 mg daily for now. F/u in 2  weeks.  If she has not noticed much improvement at that time, we will consider either increasing the dosage or adding BuSpar.  She would like to hold off on counseling for now. The patient and her sister voiced understanding and agreement to the plan.  [place], DO 05/15/22 4:19 PM

## 2022-05-18 ENCOUNTER — Other Ambulatory Visit: Payer: Self-pay | Admitting: Family Medicine

## 2022-05-18 ENCOUNTER — Telehealth: Payer: Self-pay | Admitting: Family Medicine

## 2022-05-18 DIAGNOSIS — L0100 Impetigo, unspecified: Secondary | ICD-10-CM

## 2022-05-18 MED ORDER — DOXYCYCLINE HYCLATE 100 MG PO TABS: 100.0000 mg | ORAL_TABLET | Freq: Two times a day (BID) | ORAL | 0 refills | Status: AC

## 2022-05-18 NOTE — Telephone Encounter (Signed)
Received approval. Patient informed//and pharmacy She stated she did not sleep well last night, but has been feeling a little better. She said her scalp is drying up again.  She said that she was  previously prescribed an antibiotic for this.

## 2022-05-18 NOTE — Telephone Encounter (Signed)
Patient informed. 

## 2022-05-18 NOTE — Telephone Encounter (Signed)
KEY;  [id number] Waiting response

## 2022-05-20 ENCOUNTER — Ambulatory Visit: Payer: Medicare Other | Admitting: Family Medicine

## 2022-05-25 ENCOUNTER — Other Ambulatory Visit: Payer: Self-pay | Admitting: Family

## 2022-05-28 DIAGNOSIS — L448 Other specified papulosquamous disorders: Secondary | ICD-10-CM | POA: Diagnosis not present

## 2022-05-28 DIAGNOSIS — L400 Psoriasis vulgaris: Secondary | ICD-10-CM | POA: Diagnosis not present

## 2022-05-29 ENCOUNTER — Encounter: Payer: Self-pay | Admitting: Family Medicine

## 2022-05-29 ENCOUNTER — Ambulatory Visit (INDEPENDENT_AMBULATORY_CARE_PROVIDER_SITE_OTHER): Payer: Medicare Other | Admitting: Family Medicine

## 2022-05-29 VITALS — BP 132/78 | HR 79 | Temp 98.1°F | Ht 60.0 in | Wt 115.5 lb

## 2022-05-29 DIAGNOSIS — F419 Anxiety disorder, unspecified: Secondary | ICD-10-CM

## 2022-05-29 NOTE — Progress Notes (Signed)
Chief Complaint  Patient presents with   Follow-up    Doing a little better    Subjective [name] presents for f/u anxiety. Here w sister.   Pt is currently being treated with Zoloft 50 mg/d, Ativan 0.25-0.5 mg TID prn.  Reports starting to improve since treatment. No thoughts of harming self or others. No self-medication with alcohol, prescription drugs or illicit drugs. Pt is not following with a counselor/psychologist.  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HCC)    Mitral regurgitation    Other iatrogenic hypothyroidism    Skin cancer    basal cell cancers   Unspecified essential hypertension    Unspecified transient cerebral ischemia    Allergies as of 05/29/2022       Reactions   Sulfonamide Derivatives    REACTION: rash        Medication List        Accurate as of May 29, 2022  2:28 PM. If you have any questions, ask your nurse or doctor.          alendronate 70 MG tablet Commonly known as: FOSAMAX TAKE 1 TABLET BY MOUTH ONCE WEEKLY ON AN EMPTY STOMACH BEFORE BREAKFAST. REMAIN UPRIGHT FOR 30 MINUTES AND TAKE WITH 8 OUNCES OF WATER   amLODipine 10 MG tablet Commonly known as: NORVASC TAKE ONE TABLET BY MOUTH DAILY   Calcium Carb-Cholecalciferol 600-5 MG-MCG Tabs Take 2 tablets by mouth daily.   CENTRUM SILVER PO Take 1 tablet by mouth daily.   cetirizine 5 MG tablet Commonly known as: ZYRTEC Take 5 mg by mouth as needed.   dipyridamole-aspirin 200-25 MG 12hr capsule Commonly known as: AGGRENOX TAKE ONE CAPSULE BY MOUTH TWICE A DAY   furosemide 20 MG tablet Commonly known as: LASIX Take 1/2 tab daily.   levothyroxine 88 MCG tablet Commonly known as: SYNTHROID Take 88 mcg by mouth daily before breakfast.   LORazepam 0.5 MG tablet Commonly known as: ATIVAN Take 0.5-1  tablets (0.25-0.5 mg total) by mouth every 6 (six) hours as needed for anxiety.   metoprolol succinate 50 MG 24 hr tablet Commonly known as: TOPROL-XL TAKE 1 TABLET BY MOUTH DAILY WITH OR IMMEDIATELY FOLLOWING A MEAL   PROBIOTIC ACIDOPHILUS PO Take by mouth daily. Reported on 12/03/2015   sertraline 50 MG tablet Commonly known as: ZOLOFT Take 1 tablet (50 mg total) by mouth daily.   simvastatin 20 MG tablet Commonly known as: ZOCOR Take 1 tablet (20 mg total) by mouth at bedtime.   Vitamin D 50 MCG (2000 UT) Caps Take 1 capsule daily.   vitamin E 180 MG (400 UNITS) capsule Take 400 Units by mouth daily.        Exam BP 132/78   Pulse 79   Temp 98.1 F (36.7 C) (Oral)   Ht 5' (1.524 m)   Wt 115 lb 8 oz (52.4 kg)   SpO2 97%   BMI 22.56 kg/m  General:  well developed, well nourished, in no apparent distress Lungs:  No respiratory distress Psych: well oriented with normal range of affect and age-appropriate judgement/insight, alert and oriented x4.  Assessment and Plan  Anxiety  Chronic, improving. Cont Ativan 0.25-0.5 mg TID, Zoloft 50 mg/d. Stay active.  F/u in 4 weeks or prn. The patient and her sister voiced  understanding and agreement to the plan.  [place], DO 05/29/22 2:28 PM

## 2022-06-04 ENCOUNTER — Other Ambulatory Visit: Payer: Self-pay | Admitting: Family Medicine

## 2022-06-19 ENCOUNTER — Telehealth: Payer: Self-pay | Admitting: Family Medicine

## 2022-06-19 NOTE — Telephone Encounter (Signed)
Pt notified , pt stated she will give [place] update next week

## 2022-06-19 NOTE — Telephone Encounter (Signed)
1/2 tab at 430 will help.

## 2022-06-19 NOTE — Telephone Encounter (Signed)
Patient called to advise that she is groggy in the mornings after taking ativan. This morning she noticed that she has less energy than usual. She took 1 ativan around 10 and another around 4:30 am. She wants to know if she should cut back on the dosage so she doesn't feel like that upon waking. Please call her to advise.

## 2022-06-23 ENCOUNTER — Telehealth: Payer: Self-pay | Admitting: Family Medicine

## 2022-06-23 NOTE — Telephone Encounter (Signed)
Called and spoke to patients sister/scheduled for tomorrow

## 2022-06-23 NOTE — Telephone Encounter (Signed)
Pt sister stated since decreasing ativan, panic attacks have come back and they would like an appt or a call today to discuss options. Advised pt's sister there were no openings at the moment but a message would be sent to the provider. Please advise.

## 2022-06-23 NOTE — Telephone Encounter (Signed)
She is taking a whole at night/taking 1/2 every 6 hours/during the day/if she wakes in the middle of the night she takes 1/2. She was doing 1 whole in the middle of the night...but making her too sleepy in the morning (started doing 1/2) and since changing seems to have been a change to making her more nervous during the daytime since not taking a whole at 3 am or 4 am. That is what they are thinking but unsure for sure. Nothing has happened that would cause her the worry.

## 2022-06-24 ENCOUNTER — Ambulatory Visit (INDEPENDENT_AMBULATORY_CARE_PROVIDER_SITE_OTHER): Payer: Medicare Other | Admitting: Family Medicine

## 2022-06-24 ENCOUNTER — Encounter: Payer: Self-pay | Admitting: Family Medicine

## 2022-06-24 VITALS — BP 122/76 | HR 77 | Temp 98.0°F | Ht 60.0 in | Wt 117.1 lb

## 2022-06-24 DIAGNOSIS — F419 Anxiety disorder, unspecified: Secondary | ICD-10-CM

## 2022-06-24 MED ORDER — SERTRALINE HCL 100 MG PO TABS: 100.0000 mg | ORAL_TABLET | Freq: Every day | ORAL | 3 refills | Status: DC

## 2022-06-24 NOTE — Patient Instructions (Addendum)
Stay as active as possible.  Please consider counseling. Contact [phone] to schedule an appointment or inquire about cost/insurance coverage.  [facility] located at [place], [place], [place].  Phone number = [phone].  Dr. Lennice Sites - Adult Psychiatry.    [facility] located at [place], [place], [place]. Phone number = [phone].   [facility] located at [address] [place], [place].  Phone number = [phone].   [facility] located at [place], [place], [place].  Phone number = [phone].  Let us know if you need anything.

## 2022-06-24 NOTE — Progress Notes (Signed)
CC: F/u   Subjective [name] presents for f/u anxiety/depression.  She is here with her sister.  Pt is currently being treated with sertraline 50 mg daily, Ativan 0.25-0.5 mg every 6 hours as needed.  Reports lots of anxiety and trouble sleeping lately.   She takes a full tab of the Ativan, she gets drowsy.  If she takes half a tab, it is not effective at night. No thoughts of harming self or others. No self-medication with alcohol, prescription drugs or illicit drugs. Pt is not following with a counselor/psychologist.  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HCC)    Mitral regurgitation    Other iatrogenic hypothyroidism    Skin cancer    basal cell cancers   Unspecified essential hypertension    Unspecified transient cerebral ischemia    Allergies as of 06/24/2022       Reactions   Sulfonamide Derivatives    REACTION: rash        Medication List        Accurate as of June 24, 2022 12:19 PM. If you have any questions, ask your nurse or doctor.          alendronate 70 MG tablet Commonly known as: FOSAMAX TAKE 1 TABLET BY MOUTH ONCE WEEKLY ON AN EMPTY STOMACH BEFORE BREAKFAST. REMAIN UPRIGHT FOR 30 MINUTES AND TAKE WITH 8 OUNCES OF WATER   amLODipine 10 MG tablet Commonly known as: NORVASC TAKE ONE TABLET BY MOUTH DAILY   Calcium Carb-Cholecalciferol 600-5 MG-MCG Tabs Take 2 tablets by mouth daily.   CENTRUM SILVER PO Take 1 tablet by mouth daily.   cetirizine 5 MG tablet Commonly known as: ZYRTEC Take 5 mg by mouth as needed.   dipyridamole-aspirin 200-25 MG 12hr capsule Commonly known as: AGGRENOX TAKE ONE CAPSULE BY MOUTH TWICE A DAY   furosemide 20 MG tablet Commonly known as: LASIX Take 1/2 tab daily.   levothyroxine 88 MCG tablet Commonly known as: SYNTHROID Take 88 mcg by  mouth daily before breakfast.   LORazepam 0.5 MG tablet Commonly known as: ATIVAN Take 0.5-1 tablets (0.25-0.5 mg total) by mouth every 6 (six) hours as needed for anxiety.   metoprolol succinate 50 MG 24 hr tablet Commonly known as: TOPROL-XL TAKE 1 TABLET BY MOUTH DAILY WITH OR IMMEDIATELY FOLLOWING A MEAL   PROBIOTIC ACIDOPHILUS PO Take by mouth daily. Reported on 12/03/2015   sertraline 100 MG tablet Commonly known as: ZOLOFT Take 1 tablet (100 mg total) by mouth daily. What changed:  medication strength how much to take Changed by: Shelda Pal, DO   simvastatin 20 MG tablet Commonly known as: ZOCOR Take 1 tablet (20 mg total) by mouth at bedtime.   Vitamin D 50 MCG (2000 UT) Caps Take 1 capsule daily.   vitamin E 180 MG (400 UNITS) capsule Take 400 Units by mouth daily.        Exam BP 122/76   Pulse 77   Temp 98 F (36.7 C) (Oral)   Ht 5' (1.524 m)   Wt 117 lb 2 oz (53.1 kg)   SpO2 95%   BMI 22.87 kg/m  General:  well developed, well nourished, in no apparent distress Lungs:  No respiratory distress Psych: Age appropriate judgment and insight, flat affect, mood appears normal during exam  Assessment and Plan  Anxiety - Plan: sertraline (ZOLOFT) 100 MG tablet  Chronic, not stable.  Continue as needed Ativan, increase Zoloft from 50 mg daily to 100 daily.  Counseling information provided paperwork.  Stay as active as possible. F/u in 3 weeks. The patient and her sister voiced understanding and agreement to the plan.  [place], DO 06/24/22 12:19 PM

## 2022-06-29 ENCOUNTER — Ambulatory Visit: Payer: Medicare Other | Admitting: Family Medicine

## 2022-07-12 NOTE — Progress Notes (Unsigned)
Cardiology Office Note   Date:  07/14/2022   ID:  [name], DOB 04-Feb-1941, MRN [id number]  PCP:  Shelda Pal, DO  Cardiologist:   Minus Breeding, MD   Chief Complaint  Patient presents with   Shortness of Breath     History of Present Illness: [name] is a 81 y.o. female for follow up of mild AS and AI.  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    Since I last saw he she has lost a brother-in-law.  She had previously suffered the loss of her husband.  She is actually been in the emergency room with insomnia and is being managed for depression.  She is taking medications and she wonders if they might make her fatigued.  Among these are benzodiazepines.  She takes these couple times a day per instructions.  She has not sleep necessarily well at night.  She is tired.  She is noted she had a little more short of breath walking up a hill.  She lives by herself but her sister helps her get to places.  She has not had any chest pressure, neck or arm discomfort.  She is not describing PND or orthopnea.  She had no weight gain or edema.  She had no palpitations, presyncope or syncope.   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 REMAIN UPRIGHT FOR 30 MINUTES AND TAKE WITH 8 OUNCES OF WATER 12 tablet 3   amLODipine (NORVASC) 10 MG tablet TAKE ONE TABLET BY MOUTH DAILY 90 tablet 2   Calcium Carb-Cholecalciferol 600-5 MG-MCG TABS Take 2 tablets by mouth daily.     cetirizine (ZYRTEC) 5 MG tablet Take 5 mg by mouth as needed.     Cholecalciferol (VITAMIN D) 50 MCG (2000 UT) CAPS Take 1 capsule daily. 30 capsule 0   dipyridamole-aspirin (AGGRENOX) 200-25 MG 12hr capsule TAKE ONE CAPSULE BY MOUTH TWICE A DAY 60 capsule 2   fluocinonide (LIDEX) 0.05 % external solution Apply 1 Application topically 2 (two) times daily.     furosemide (LASIX) 20 MG tablet Take 1/2 tab daily. 45 tablet 2   ketoconazole (NIZORAL) 2 % shampoo Apply topically.     Lactobacillus (PROBIOTIC ACIDOPHILUS PO) Take by mouth daily. Reported on 12/03/2015     levocetirizine (XYZAL) 5 MG tablet Take 5 mg by mouth daily.     levothyroxine (SYNTHROID) 88 MCG tablet Take 88 mcg by mouth daily before breakfast.     LORazepam (ATIVAN) 0.5 MG tablet Take 0.5-1 tablets (  0.25-0.5 mg total) by mouth every 6 (six) hours as needed for anxiety. 90 tablet 1   metoprolol succinate (TOPROL-XL) 50 MG 24 hr tablet TAKE 1 TABLET BY MOUTH DAILY WITH OR IMMEDIATELY FOLLOWING A MEAL 90 tablet 3   Multiple Vitamins-Minerals (CENTRUM SILVER PO) Take 1 tablet by mouth daily.     sertraline (ZOLOFT) 100 MG tablet Take 1 tablet (100 mg total) by mouth daily. 30 tablet 3   simvastatin (ZOCOR) 20 MG tablet Take 1 tablet (20 mg total) by mouth at bedtime. 90 tablet 3   vitamin E 400 UNIT capsule Take 400 Units by mouth daily.     No current facility-administered medications for this visit.    Allergies:   Sulfonamide derivatives    ROS:  Please see the history of present illness.   Otherwise, review of systems are positive for none.   Bush other systems are reviewed and negative.    PHYSICAL EXAM: VS:  BP  130/60   Pulse 79   Ht 5' (1.524 m)   Wt 117 lb 12.8 oz (53.4 kg)   SpO2 98%   BMI 23.01 kg/m  , BMI Body mass index is 23.01 kg/m. GENERAL:  Well appearing NECK:  No jugular venous distention, waveform within normal limits, carotid upstroke brisk and symmetric, no bruits, no thyromegaly LUNGS:  Clear to auscultation bilaterally CHEST:  Unremarkable HEART:  PMI not displaced or sustained,S1 and S2 within normal limits, no S3, no S4, no clicks, no rubs, 3 out of 6 apical mid peaking systolic murmur radiating at the aortic outflow tract, no diastolic murmurs ABD:  Flat, positive bowel sounds normal in frequency in pitch, no bruits, no rebound, no guarding, no midline pulsatile mass, no hepatomegaly, no splenomegaly EXT:  2 plus pulses throughout, no edema, no cyanosis no clubbing   EKG:  EKG is  ordered today. The ekg ordered today demonstrates normal sinus rhythm, rate 79. Normal axis, intervals.    Recent Labs: 02/18/2022: ALT 17 05/06/2022: BUN 12; Creatinine, Ser 0.64; Hemoglobin 14.2; Platelets 317; Potassium 4.2; Sodium 130    Lipid Panel    Component Value Date/Time   CHOL 166 02/18/2022 0920   TRIG 106.0 02/18/2022 0920   HDL 63.30 02/18/2022 0920   CHOLHDL 3 02/18/2022 0920   VLDL 21.2 02/18/2022 0920   LDLCALC 82 02/18/2022 0920   LDLDIRECT 200.1 02/29/2008 1026      Wt Readings from Last 3 Encounters:  07/14/22 117 lb 12.8 oz (53.4 kg)  06/24/22 117 lb 2 oz (53.1 kg)  05/29/22 115 lb 8 oz (52.4 kg)      Other studies Reviewed: Additional studies/ records that were reviewed today include: ED records Review of the above records demonstrates: See elsewhere   ASSESSMENT AND PLAN:  AS:   I suspect this has progressed.  I am going to check another echocardiogram.  Further management and evaluation will be based on this.  AI:   This was mild on the last echo in 2020.  This will be assessed as above.   HTN:   BP is controlled.  No change in therapy.   HLD:   LDL was 82 with an HDL of 63.3 in April.  No change in therapy.   THYROID CANCER: She has been managed by Dr. Buddy Duty.    TIA:   She will continue with Aggrenox.     Current medicines are reviewed at length with the patient today.  The patient doe snot have concerns regarding medicines.  The following changes have been made:  None  Labs/ tests ordered today include:      Orders Placed This Encounter  Procedures   EKG 12-Lead   ECHOCARDIOGRAM COMPLETE      Disposition:   FU with me in 4 months Signed, Minus Breeding, MD  07/14/2022 3:30 PM    [place]

## 2022-07-14 ENCOUNTER — Ambulatory Visit: Payer: Medicare Other | Attending: Cardiology | Admitting: Cardiology

## 2022-07-14 ENCOUNTER — Encounter: Payer: Self-pay | Admitting: Cardiology

## 2022-07-14 VITALS — BP 130/60 | HR 79 | Ht 60.0 in | Wt 117.8 lb

## 2022-07-14 DIAGNOSIS — I10 Essential (primary) hypertension: Secondary | ICD-10-CM | POA: Diagnosis not present

## 2022-07-14 DIAGNOSIS — R072 Precordial pain: Secondary | ICD-10-CM

## 2022-07-14 DIAGNOSIS — I351 Nonrheumatic aortic (valve) insufficiency: Secondary | ICD-10-CM | POA: Diagnosis not present

## 2022-07-14 DIAGNOSIS — E785 Hyperlipidemia, unspecified: Secondary | ICD-10-CM | POA: Diagnosis not present

## 2022-07-14 DIAGNOSIS — I350 Nonrheumatic aortic (valve) stenosis: Secondary | ICD-10-CM

## 2022-07-14 NOTE — Patient Instructions (Signed)
  Testing/Procedures:  Your physician has requested that you have an echocardiogram. Echocardiography is a painless test that uses sound waves to create images of your heart. It provides your doctor with information about the size and shape of your heart and how well your heart's chambers and valves are working. This procedure takes approximately one hour. There are no restrictions for this procedure. [place], you and your health needs are our priority.  As part of our continuing mission to provide you with exceptional heart care, we have created designated Provider Care Teams.  These Care Teams include your primary Cardiologist (physician) and Advanced Practice Providers (APPs -  Physician Assistants and Nurse Practitioners) who all work together to provide you with the care you need, when you need it.  We recommend signing up for the patient portal called "MyChart".  Sign up information is provided on this After Visit Summary.  MyChart is used to connect with patients for Virtual Visits (Telemedicine).  Patients are able to view lab/test results, encounter notes, upcoming appointments, etc.  Non-urgent messages can be sent to your provider as well.   To learn more about what you can do with MyChart, go to [email or web address].    Your next appointment:   4 month(s)  The format for your next appointment:   In Person  Provider:   Minus Breeding, MD

## 2022-07-15 ENCOUNTER — Other Ambulatory Visit: Payer: Self-pay | Admitting: Family Medicine

## 2022-07-15 DIAGNOSIS — F419 Anxiety disorder, unspecified: Secondary | ICD-10-CM

## 2022-07-15 NOTE — Telephone Encounter (Signed)
Requesting: lorazepam 0.'5mg'$   Contract: None UDS: None Last Visit: 06/24/22 Next Visit:07/22/22 Last Refill: 05/15/22 #90 and 1RF  Please Advise

## 2022-07-16 DIAGNOSIS — L400 Psoriasis vulgaris: Secondary | ICD-10-CM | POA: Diagnosis not present

## 2022-07-16 DIAGNOSIS — L448 Other specified papulosquamous disorders: Secondary | ICD-10-CM | POA: Diagnosis not present

## 2022-07-22 ENCOUNTER — Ambulatory Visit (INDEPENDENT_AMBULATORY_CARE_PROVIDER_SITE_OTHER): Payer: Medicare Other | Admitting: Family Medicine

## 2022-07-22 ENCOUNTER — Encounter: Payer: Self-pay | Admitting: Family Medicine

## 2022-07-22 VITALS — BP 120/68 | HR 78 | Temp 97.7°F | Resp 16 | Ht 60.0 in | Wt 118.0 lb

## 2022-07-22 DIAGNOSIS — F419 Anxiety disorder, unspecified: Secondary | ICD-10-CM | POA: Diagnosis not present

## 2022-07-22 NOTE — Patient Instructions (Addendum)
Take 1/2 a tab before bed and during the day. Take a full tab if/when you wake up in the middle of the night. If you are doing well for 3-4 days, then take a 1/2 tab when you wake up as well.   Stay as active as able.   Let us know if you need anything.

## 2022-07-22 NOTE — Progress Notes (Signed)
Chief Complaint  Patient presents with   Anxiety   Follow-up    Subjective: Patient is a 81 y.o. female here for f/u.  She is here with her sister who helps with the history.  Patient is currently taking sertraline 100 mg daily and Ativan 0.25-0.5 mg every 6 hours as needed.  Currently she is taking 0.5 mg before bed and another 0.5 mg when she wakes up around 4 in the morning.  It is giving her decreased energy in the morning.  She is starting to sleep better and feel her mood improved.  She is wondering if she is able to start to wean down on the Ativan.  She takes 0.25 mg twice daily while she is awake.  No homicidal or suicidal ideation.  No self-medication.  She is not following with a counselor or psychologist.  She thought some fatigue was due to her medication but after seeing her cardiologist, it may be due to worsening aortic stenosis.  She has an echo scheduled on the 28th of this month.  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HCC)    Mitral regurgitation    Other iatrogenic hypothyroidism    Skin cancer    basal cell cancers   Unspecified essential hypertension    Unspecified transient cerebral ischemia     Objective: BP 120/68 (BP Location: Left Arm, Patient Position: Sitting, Cuff Size: Normal)   Pulse 78   Temp 97.7 F (36.5 C) (Oral)   Resp 16   Ht 5' (1.524 m)   Wt 118 lb (53.5 kg)   SpO2 96%   BMI 23.05 kg/m  General: Awake, appears stated age Lungs: No accessory muscle use Psych: Age appropriate judgment and insight, normal affect and mood  Assessment and Plan: Anxiety  She looks much better today.  Continue Zoloft 100 mg daily.  She will start taking 0.25 mg before bed in addition to her 2 daytime doses.  She will continue the 0.5 mg when she wakes up in the middle of the night.  If she does well  over the next 3 to 4 days, she will wean this down to 0.25 mg as well.  I will tentatively see her in 6 weeks unless she needs me.  Hopefully we can continue to wean down Ativan. The patient and her sister voiced understanding and agreement to the plan.  [place], DO 07/22/22  4:34 PM

## 2022-07-27 ENCOUNTER — Telehealth: Payer: Self-pay | Admitting: Family Medicine

## 2022-07-27 NOTE — Telephone Encounter (Signed)
She can start cutting that tab in half now when she wakes up as well. Ty.

## 2022-07-27 NOTE — Telephone Encounter (Signed)
Pt called stating that she has cutback to half a pill of ativan before she goes to bed. But she still wanted some advice on when she wakes up in the middle of the night to take the other half? Please Advise.

## 2022-07-27 NOTE — Telephone Encounter (Signed)
Called and informed of PCP instructions.on medication. She will keep PCP updated on how she is doing.

## 2022-07-30 ENCOUNTER — Ambulatory Visit (HOSPITAL_COMMUNITY): Payer: Medicare Other | Attending: Cardiology

## 2022-07-30 DIAGNOSIS — I350 Nonrheumatic aortic (valve) stenosis: Secondary | ICD-10-CM | POA: Diagnosis not present

## 2022-07-30 LAB — ECHOCARDIOGRAM COMPLETE
AR max vel: 0.99 cm2
AV Area VTI: 1.1 cm2
AV Area mean vel: 0.98 cm2
AV Mean grad: 22 mmHg
AV Peak grad: 37.1 mmHg
Ao pk vel: 3.05 m/s
Area-P 1/2: 3.37 cm2
S' Lateral: 2.3 cm

## 2022-08-01 ENCOUNTER — Other Ambulatory Visit: Payer: Self-pay | Admitting: Family Medicine

## 2022-08-01 DIAGNOSIS — I679 Cerebrovascular disease, unspecified: Secondary | ICD-10-CM

## 2022-08-07 DIAGNOSIS — R7309 Other abnormal glucose: Secondary | ICD-10-CM | POA: Diagnosis not present

## 2022-08-07 DIAGNOSIS — E890 Postprocedural hypothyroidism: Secondary | ICD-10-CM | POA: Diagnosis not present

## 2022-08-07 DIAGNOSIS — Z85850 Personal history of malignant neoplasm of thyroid: Secondary | ICD-10-CM | POA: Diagnosis not present

## 2022-08-11 ENCOUNTER — Encounter: Payer: Medicare Other | Admitting: Family Medicine

## 2022-08-12 DIAGNOSIS — Z1231 Encounter for screening mammogram for malignant neoplasm of breast: Secondary | ICD-10-CM | POA: Diagnosis not present

## 2022-08-14 DIAGNOSIS — Z833 Family history of diabetes mellitus: Secondary | ICD-10-CM | POA: Diagnosis not present

## 2022-08-14 DIAGNOSIS — Z23 Encounter for immunization: Secondary | ICD-10-CM | POA: Diagnosis not present

## 2022-08-14 DIAGNOSIS — R7309 Other abnormal glucose: Secondary | ICD-10-CM | POA: Diagnosis not present

## 2022-08-14 DIAGNOSIS — E890 Postprocedural hypothyroidism: Secondary | ICD-10-CM | POA: Diagnosis not present

## 2022-08-14 DIAGNOSIS — Z85850 Personal history of malignant neoplasm of thyroid: Secondary | ICD-10-CM | POA: Diagnosis not present

## 2022-09-02 ENCOUNTER — Ambulatory Visit (INDEPENDENT_AMBULATORY_CARE_PROVIDER_SITE_OTHER): Payer: Medicare Other | Admitting: Family Medicine

## 2022-09-02 ENCOUNTER — Encounter: Payer: Self-pay | Admitting: Family Medicine

## 2022-09-02 VITALS — BP 136/83 | HR 79 | Temp 98.0°F | Ht 60.0 in | Wt 118.4 lb

## 2022-09-02 DIAGNOSIS — F419 Anxiety disorder, unspecified: Secondary | ICD-10-CM | POA: Diagnosis not present

## 2022-09-02 NOTE — Progress Notes (Signed)
Chief Complaint  Patient presents with   Follow-up    Subjective [name] presents for f/u anxiety. Here w sister who helps w hx.   Pt is currently being treated with Zoloft 100 mg/d, Ativan 0.25 mg qid prn.  Reports improving since treatment. No thoughts of harming self or others. No self-medication with alcohol, prescription drugs or illicit drugs. Pt is not following with a counselor/psychologist.  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HCC)    Mitral regurgitation    Other iatrogenic hypothyroidism    Skin cancer    basal cell cancers   Unspecified essential hypertension    Unspecified transient cerebral ischemia    Allergies as of 09/02/2022       Reactions   Sulfonamide Derivatives    REACTION: rash        Medication List        Accurate as of September 02, 2022  5:01 PM. If you have any questions, ask your nurse or doctor.          alendronate 70 MG tablet Commonly known as: FOSAMAX TAKE 1 TABLET BY MOUTH ONCE WEEKLY ON AN EMPTY STOMACH BEFORE BREAKFAST. REMAIN UPRIGHT FOR 30 MINUTES AND TAKE WITH 8 OUNCES OF WATER   amLODipine 10 MG tablet Commonly known as: NORVASC TAKE ONE TABLET BY MOUTH DAILY   Calcium Carb-Cholecalciferol 600-5 MG-MCG Tabs Take 2 tablets by mouth daily.   CENTRUM SILVER PO Take 1 tablet by mouth daily.   cetirizine 5 MG tablet Commonly known as: ZYRTEC Take 5 mg by mouth as needed.   dipyridamole-aspirin 200-25 MG 12hr capsule Commonly known as: AGGRENOX TAKE 1 CAPSULE BY MOUTH TWICE A DAY   fluocinonide 0.05 % external solution Commonly known as: LIDEX Apply 1 Application topically every other day.   furosemide 20 MG tablet Commonly known as: LASIX Take 1/2 tab daily.   ketoconazole 2 % shampoo Commonly known as: NIZORAL Apply topically once a  week.   levocetirizine 5 MG tablet Commonly known as: XYZAL Take 5 mg by mouth daily.   levothyroxine 100 MCG tablet Commonly known as: SYNTHROID Take 100 mcg by mouth daily. What changed: Another medication with the same name was removed. Continue taking this medication, and follow the directions you see here. Changed by: Shelda Pal, DO   LORazepam 0.5 MG tablet Commonly known as: ATIVAN TAKE ONE-HALF TO ONE TABLET BY MOUTH EVERY 6 HOURS AS NEEDED FOR ANXIETY   metoprolol succinate 50 MG 24 hr tablet Commonly known as: TOPROL-XL TAKE 1 TABLET BY MOUTH DAILY WITH OR IMMEDIATELY FOLLOWING A MEAL   PROBIOTIC ACIDOPHILUS PO Take by mouth daily. Reported on 12/03/2015   sertraline 100 MG tablet Commonly known as: ZOLOFT Take 1 tablet (100 mg total) by mouth daily.   simvastatin 20 MG tablet Commonly known as: ZOCOR Take 1 tablet (20 mg total) by mouth at bedtime.   Vitamin D 50 MCG (2000 UT) Caps Take 1 capsule daily.   vitamin E 180 MG (400 UNITS) capsule Take 400 Units by mouth daily.        Exam BP 136/83 (BP Location: Left Arm, Patient Position: Sitting, Cuff Size: Normal)   Pulse 79   Temp 98 F (36.7 C) (Oral)   Ht 5' (1.524 m)   Wt 118  lb 6 oz (53.7 kg)   SpO2 98%   BMI 23.12 kg/m  General:  well developed, well nourished, in no apparent distress Lungs:  No respiratory distress Psych: well oriented with normal range of affect and age-appropriate judgement/insight, alert and oriented x4.  Assessment and Plan  Anxiety  She looks pretty good again today.  We are going to try to wean down her Ativan even further.  She will continue the nightly dose of 0.25 mg and dropped the 3 AM dosage.  She will continue the daytime doses as well.  Stay on the sertraline 100 mg daily.  She is not interested in counseling at this time.  Try to stay active.  Follow-up in 6 weeks to recheck this in addition to her regular medication check. The patient and her  sister voiced understanding and agreement to the plan.  I spent 30 minutes with the patient and her sister discussing the above plan in addition to reviewing her chart and the same date of visit.  [place], DO 09/02/22 5:01 PM

## 2022-09-02 NOTE — Patient Instructions (Signed)
Stop the 1/2 tab in the 3-4 AM dosage. Continue the nightly dosage and the 1-2 times during the day for now.   Call in a couple weeks with how you are doing.   Let us know if you need anything.

## 2022-09-08 DIAGNOSIS — M810 Age-related osteoporosis without current pathological fracture: Secondary | ICD-10-CM | POA: Diagnosis not present

## 2022-09-08 DIAGNOSIS — M85851 Other specified disorders of bone density and structure, right thigh: Secondary | ICD-10-CM | POA: Diagnosis not present

## 2022-09-08 DIAGNOSIS — Z780 Asymptomatic menopausal state: Secondary | ICD-10-CM | POA: Diagnosis not present

## 2022-09-08 LAB — HM MAMMOGRAPHY

## 2022-09-10 NOTE — Progress Notes (Signed)
Erroneous encounter - please disregard.

## 2022-09-16 ENCOUNTER — Telehealth: Payer: Self-pay | Admitting: Family Medicine

## 2022-09-16 NOTE — Telephone Encounter (Signed)
Should be coming.

## 2022-09-16 NOTE — Telephone Encounter (Signed)
Pt called to advise Dr. Nani Ravens that she has cut back on the ativan to half a pill 3 times daily. She was wanting to know if she she stay this course or make changes to what she is taking. Please Advise.

## 2022-09-16 NOTE — Telephone Encounter (Signed)
Let's try for 2 times daily before bed and mid morning.

## 2022-09-16 NOTE — Telephone Encounter (Signed)
No, where was it done? If not thru [facility], will be receiving via fax soon I imagine.

## 2022-09-16 NOTE — Telephone Encounter (Signed)
Patient  informed of PCP response. She would like to know if PCP has seen her Bone Density results yet

## 2022-09-18 ENCOUNTER — Encounter: Payer: Self-pay | Admitting: Family Medicine

## 2022-09-23 DIAGNOSIS — L814 Other melanin hyperpigmentation: Secondary | ICD-10-CM | POA: Diagnosis not present

## 2022-09-23 DIAGNOSIS — D225 Melanocytic nevi of trunk: Secondary | ICD-10-CM | POA: Diagnosis not present

## 2022-09-23 DIAGNOSIS — L400 Psoriasis vulgaris: Secondary | ICD-10-CM | POA: Diagnosis not present

## 2022-09-23 DIAGNOSIS — L821 Other seborrheic keratosis: Secondary | ICD-10-CM | POA: Diagnosis not present

## 2022-09-23 DIAGNOSIS — L448 Other specified papulosquamous disorders: Secondary | ICD-10-CM | POA: Diagnosis not present

## 2022-10-02 DIAGNOSIS — E890 Postprocedural hypothyroidism: Secondary | ICD-10-CM | POA: Diagnosis not present

## 2022-10-02 DIAGNOSIS — Z85850 Personal history of malignant neoplasm of thyroid: Secondary | ICD-10-CM | POA: Diagnosis not present

## 2022-10-14 ENCOUNTER — Encounter: Payer: Self-pay | Admitting: Family Medicine

## 2022-10-14 ENCOUNTER — Ambulatory Visit (INDEPENDENT_AMBULATORY_CARE_PROVIDER_SITE_OTHER): Payer: Medicare Other | Admitting: Family Medicine

## 2022-10-14 DIAGNOSIS — F419 Anxiety disorder, unspecified: Secondary | ICD-10-CM

## 2022-10-14 MED ORDER — SERTRALINE HCL 100 MG PO TABS: 100.0000 mg | ORAL_TABLET | Freq: Every day | ORAL | 3 refills | Status: DC

## 2022-10-14 MED ORDER — LORAZEPAM 0.5 MG PO TABS: ORAL_TABLET | ORAL | 1 refills | Status: DC

## 2022-10-14 NOTE — Progress Notes (Signed)
Chief Complaint  Patient presents with   Follow-up    6 week    Subjective [name] presents for f/u anxiety/depression.   Pt is currently being treated with Zoloft 100 mg/d, Ativan 0.25 mg bid prn.  Still having some nervousness and jitteriness.  This is slowly improving. No thoughts of harming self or others. No self-medication with alcohol, prescription drugs or illicit drugs. Pt is not following with a counselor/psychologist.  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HCC)    Mitral regurgitation    Other iatrogenic hypothyroidism    Skin cancer    basal cell cancers   Unspecified essential hypertension    Unspecified transient cerebral ischemia    Allergies as of 10/14/2022       Reactions   Sulfonamide Derivatives    REACTION: rash        Medication List        Accurate as of October 14, 2022  2:14 PM. If you have any questions, ask your nurse or doctor.          alendronate 70 MG tablet Commonly known as: FOSAMAX TAKE 1 TABLET BY MOUTH ONCE WEEKLY ON AN EMPTY STOMACH BEFORE BREAKFAST. REMAIN UPRIGHT FOR 30 MINUTES AND TAKE WITH 8 OUNCES OF WATER   amLODipine 10 MG tablet Commonly known as: NORVASC TAKE ONE TABLET BY MOUTH DAILY   Calcium Carb-Cholecalciferol 600-5 MG-MCG Tabs Take 2 tablets by mouth daily.   CENTRUM SILVER PO Take 1 tablet by mouth daily.   cetirizine 5 MG tablet Commonly known as: ZYRTEC Take 5 mg by mouth as needed.   dipyridamole-aspirin 200-25 MG 12hr capsule Commonly known as: AGGRENOX TAKE 1 CAPSULE BY MOUTH TWICE A DAY   fluocinonide 0.05 % external solution Commonly known as: LIDEX Apply 1 Application topically every other day.   furosemide 20 MG tablet Commonly known as: LASIX Take 1/2 tab daily.   ketoconazole 2 % shampoo Commonly known as:  NIZORAL Apply topically once a week.   levocetirizine 5 MG tablet Commonly known as: XYZAL Take 5 mg by mouth daily.   levothyroxine 100 MCG tablet Commonly known as: SYNTHROID Take 100 mcg by mouth daily.   LORazepam 0.5 MG tablet Commonly known as: ATIVAN TAKE ONE-HALF TO ONE TABLET BY MOUTH EVERY 12 HOURS AS NEEDED FOR ANXIETY What changed: See the new instructions. Changed by: Shelda Pal, DO   metoprolol succinate 50 MG 24 hr tablet Commonly known as: TOPROL-XL TAKE 1 TABLET BY MOUTH DAILY WITH OR IMMEDIATELY FOLLOWING A MEAL   PROBIOTIC ACIDOPHILUS PO Take by mouth daily. Reported on 12/03/2015   sertraline 100 MG tablet Commonly known as: ZOLOFT Take 1 tablet (100 mg total) by mouth daily.   simvastatin 20 MG tablet Commonly known as: ZOCOR Take 1 tablet (20 mg total) by mouth at bedtime.   Vitamin D 50 MCG (2000 UT) Caps Take 1 capsule daily.   vitamin E 180 MG (400 UNITS) capsule Take 400 Units by mouth daily.        Exam BP 112/68 (BP Location: Left Arm, Patient Position: Sitting, Cuff Size: Normal)   Pulse 89   Temp 98 F (36.7 C) (Oral)   Ht 5' (1.524 m)   Wt 118 lb 8 oz (53.8 kg)   SpO2 98%  BMI 23.14 kg/m  General:  well developed, well nourished, in no apparent distress Lungs:  No respiratory distress Psych: well oriented with normal range of affect and age-appropriate judgement/insight, alert and oriented x4.  Assessment and Plan  Anxiety - Plan: sertraline (ZOLOFT) 100 MG tablet, LORazepam (ATIVAN) 0.5 MG tablet  Chronic, currently stable.  Will continue Zoloft 100 mg daily and Ativan 0.25 mg twice daily.  The goal will be to come off of the Ativan, but based off of some of her symptoms, I do not think weaning down at this time, after already cutting down from 1 mg 4 times a day, is realistically feasible.  Continue exercising.  I will see her in 3 months.  Will check labs at that time. The patient voiced understanding and  agreement to the plan.  [place], DO 10/14/22 2:14 PM

## 2022-10-14 NOTE — Patient Instructions (Signed)
Stay active.  Let me know if you feel you are ready to come off of more Ativan.  Let us know if you need anything.

## 2022-11-04 ENCOUNTER — Other Ambulatory Visit: Payer: Self-pay | Admitting: Family Medicine

## 2022-11-04 DIAGNOSIS — I679 Cerebrovascular disease, unspecified: Secondary | ICD-10-CM

## 2022-11-26 NOTE — Progress Notes (Signed)
Cardiology Office Note   Date:  11/27/2022   ID:  [name], [name] Jul 01, 1941, MRN [id number]  PCP:  Shelda Pal, DO  Cardiologist:   Minus Breeding, MD   Chief Complaint  Patient presents with   Shortness of Breath     History of Present Illness: [name] is a 82 y.o. female for follow up of moderate AS on echo in Sept.  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     Since I last saw her she has had no new cardiovascular problems.  She lives by herself.  A while back she lost her brother-in-law and this was after having lost her husband.  She is very anxious taking fair amount of benzodiazepines she has been trying to wean herself off of this.  She has been getting some nervous feeling because of this.  She does not have anything like shortness of breath, PND or orthopnea.  She is not having any palpitations, presyncope or syncope.  She is not having any chest pressure, neck or arm discomfort.   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 REMAIN UPRIGHT FOR 30 MINUTES AND TAKE WITH 8 OUNCES OF WATER 12 tablet 3   amLODipine (NORVASC) 10 MG tablet TAKE ONE  TABLET BY MOUTH DAILY 90 tablet 2   Calcium Carb-Cholecalciferol 600-5 MG-MCG TABS Take 2 tablets by mouth daily.     cetirizine (ZYRTEC) 5 MG tablet Take 5 mg by mouth as needed.     Cholecalciferol (VITAMIN D) 50 MCG (2000 UT) CAPS Take 1 capsule daily. 30 capsule 0   dipyridamole-aspirin (AGGRENOX) 200-25 MG 12hr capsule TAKE 1 CAPSULE BY MOUTH TWICE A DAY 60 capsule 2   fluocinonide (LIDEX) 0.05 % external solution Apply 1 Application topically every other day.     furosemide (LASIX) 20 MG tablet TAKE 1/2 TABLET BY MOUTH DAILY 45 tablet 2   ketoconazole (NIZORAL) 2 % shampoo Apply topically once a week.     Lactobacillus (PROBIOTIC ACIDOPHILUS PO) Take by mouth daily. Reported on 12/03/2015     levocetirizine (XYZAL) 5 MG tablet Take 5 mg by mouth daily.     levothyroxine (SYNTHROID) 100 MCG tablet Take 100 mcg by mouth daily.     LORazepam (ATIVAN) 0.5 MG tablet TAKE ONE-HALF TO ONE TABLET BY MOUTH EVERY 12 HOURS AS NEEDED FOR ANXIETY 90 tablet 1   metoprolol succinate (TOPROL-XL) 50 MG 24 hr tablet TAKE 1 TABLET BY MOUTH DAILY WITH OR IMMEDIATELY FOLLOWING A MEAL 90 tablet 3  Multiple Vitamins-Minerals (CENTRUM SILVER PO) Take 1 tablet by mouth daily.     sertraline (ZOLOFT) 100 MG tablet Take 1 tablet (100 mg total) by mouth daily. 90 tablet 3   simvastatin (ZOCOR) 20 MG tablet Take 1 tablet (20 mg total) by mouth at bedtime. 90 tablet 3   vitamin E 400 UNIT capsule Take 400 Units by mouth daily.     No current facility-administered medications for this visit.    Allergies:   Sulfonamide derivatives    ROS:  Please see the history of present illness.   Otherwise, review of systems are positive for burping.   All other systems are reviewed and negative.    PHYSICAL EXAM: VS:  BP 116/62 (BP Location: Left Arm, Patient Position: Sitting, Cuff Size: Normal)   Pulse 81   Ht 5' (1.524 m)   Wt 115 lb (52.2 kg)   BMI 22.46 kg/m  , BMI Body mass index is 22.46 kg/m. GENERAL:  Well  appearing NECK:  No jugular venous distention, waveform within normal limits, carotid upstroke brisk and symmetric, no bruits, no thyromegaly LUNGS:  Clear to auscultation bilaterally CHEST:  Unremarkable HEART:  PMI not displaced or sustained,S1 and S2 within normal limits, no S3, no S4, no clicks, no rubs, 2 out of 6 apical systolic murmur radiating at the right upper tract, no diastolic murmurs ABD:  Flat, positive bowel sounds normal in frequency in pitch, no bruits, no rebound, no guarding, no midline pulsatile mass, no hepatomegaly, no splenomegaly EXT:  2 plus pulses throughout, no edema, no cyanosis no clubbing   EKG:  EKG is not ordered today. The ekg ordered 07/14/2022 demonstrates normal sinus rhythm, rate 79. Normal axis, intervals.    Recent Labs: 02/18/2022: ALT 17 05/06/2022: BUN 12; Creatinine, Ser 0.64; Hemoglobin 14.2; Platelets 317; Potassium 4.2; Sodium 130    Lipid Panel    Component Value Date/Time   CHOL 166 02/18/2022 0920   TRIG 106.0 02/18/2022 0920   HDL 63.30 02/18/2022 0920   CHOLHDL 3 02/18/2022 0920   VLDL 21.2 02/18/2022 0920   LDLCALC 82 02/18/2022 0920   LDLDIRECT 200.1 02/29/2008 1026      Wt Readings from Last 3 Encounters:  11/27/22 115 lb (52.2 kg)  10/14/22 118 lb 8 oz (53.8 kg)  09/02/22 118 lb 6 oz (53.7 kg)      Other studies Reviewed: Additional studies/ records that were reviewed today include: Labs Review of the above records demonstrates: See elsewhere   ASSESSMENT AND PLAN:  AS:   This is moderate in Sept. I will follow this clinically and with repeat echo in September.  HTN:   BP is at target.  No change in therapy.   HLD:  LDL was 82 with an HDL of 64.  No change in therapy.   THYROID CANCER: This is managed by Dr. Buddy Duty.    TIA:   She will continue with Aggrenox.     I will check carotid Dopplers as it has been 5 years and she had some mild stenosis previously.  Current medicines are reviewed at length with the  patient today.  The patient doe snot have concerns regarding medicines.  The following changes have been made: None  Labs/ tests ordered today include:       Orders Placed This Encounter  Procedures   ECHOCARDIOGRAM COMPLETE   VAS US CAROTID      Disposition:   FU with me in 12 months   Signed, Minus Breeding, MD  11/27/2022 3:29 PM    [place]

## 2022-11-27 ENCOUNTER — Encounter: Payer: Self-pay | Admitting: Cardiology

## 2022-11-27 ENCOUNTER — Ambulatory Visit: Payer: Medicare Other | Attending: Cardiology | Admitting: Cardiology

## 2022-11-27 VITALS — BP 116/62 | HR 81 | Ht 60.0 in | Wt 115.0 lb

## 2022-11-27 DIAGNOSIS — I10 Essential (primary) hypertension: Secondary | ICD-10-CM | POA: Diagnosis not present

## 2022-11-27 DIAGNOSIS — E785 Hyperlipidemia, unspecified: Secondary | ICD-10-CM | POA: Diagnosis not present

## 2022-11-27 DIAGNOSIS — I6529 Occlusion and stenosis of unspecified carotid artery: Secondary | ICD-10-CM | POA: Diagnosis not present

## 2022-11-27 DIAGNOSIS — I350 Nonrheumatic aortic (valve) stenosis: Secondary | ICD-10-CM | POA: Insufficient documentation

## 2022-11-27 NOTE — Patient Instructions (Signed)
Medication Instructions:  Your physician recommends that you continue on your current medications as directed. Please refer to the Current Medication list given to you today.  *If you need a refill on your cardiac medications before your next appointment, please call your pharmacy*   Lab Work: NONE If you have labs (blood work) drawn today and your tests are completely normal, you will receive your results only by: [facility] (if you have MyChart) OR A paper copy in the mail If you have any lab test that is abnormal or we need to change your treatment, we will call you to review the results.   Testing/Procedures: Your physician has requested that you have an echocardiogram in September of this year.  Echocardiography is a painless test that uses sound waves to create images of your heart. It provides your doctor with information about the size and shape of your heart and how well your heart's chambers and valves are working. This procedure takes approximately one hour. There are no restrictions for this procedure. Please do NOT wear cologne, perfume, aftershave, or lotions (deodorant is allowed). Please arrive 15 minutes prior to your appointment time.  Your physician has requested that you have a carotid duplex. This test is an ultrasound of the carotid arteries in your neck. It looks at blood flow through these arteries that supply the brain with blood. Allow one hour for this exam. There are no restrictions or special instructions.     Follow-Up: At [facility], you and your health needs are our priority.  As part of our continuing mission to provide you with exceptional heart care, we have created designated Provider Care Teams.  These Care Teams include your primary Cardiologist (physician) and Advanced Practice Providers (APPs -  Physician Assistants and Nurse Practitioners) who all work together to provide you with the care you need, when you need it.  We recommend  signing up for the patient portal called "MyChart".  Sign up information is provided on this After Visit Summary.  MyChart is used to connect with patients for Virtual Visits (Telemedicine).  Patients are able to view lab/test results, encounter notes, upcoming appointments, etc.  Non-urgent messages can be sent to your provider as well.   To learn more about what you can do with MyChart, go to [email or web address].    Your next appointment:   1 year(s)  Provider:   Minus Breeding, MD

## 2023-01-13 ENCOUNTER — Encounter: Payer: Self-pay | Admitting: Family Medicine

## 2023-01-13 ENCOUNTER — Ambulatory Visit (INDEPENDENT_AMBULATORY_CARE_PROVIDER_SITE_OTHER): Payer: Medicare Other | Admitting: Family Medicine

## 2023-01-13 VITALS — BP 116/64 | HR 81 | Temp 97.5°F | Resp 16 | Ht 60.0 in | Wt 116.4 lb

## 2023-01-13 DIAGNOSIS — I10 Essential (primary) hypertension: Secondary | ICD-10-CM | POA: Diagnosis not present

## 2023-01-13 DIAGNOSIS — E782 Mixed hyperlipidemia: Secondary | ICD-10-CM

## 2023-01-13 DIAGNOSIS — F419 Anxiety disorder, unspecified: Secondary | ICD-10-CM

## 2023-01-13 NOTE — Patient Instructions (Addendum)
Stop your morning dose of Ativan for 2 weeks. If you are still doing well, we will stop. If still doing well after another 2 weeks, we will start weaning down on your sertraline also. Send me a message or call if this is the case.   Give [place] 2-3 business days to get the results of your labs back.   Keep the diet clean and stay active.  Foods that may reduce pain: 1) Ginger 2) Blueberries 3) Salmon 4) Pumpkin seeds 5) dark chocolate 6) turmeric 7) tart cherries 8) virgin olive oil 9) chilli peppers 10) mint 11) krill oil  Let us know if you need anything.

## 2023-01-13 NOTE — Progress Notes (Signed)
Chief Complaint  Patient presents with   3 month follow up    [name] presents for f/u anxiety/depression.  Pt is currently being treated with Zoloft 100 mg/d. Ativan 0.5 mg bid prn. Reports doing much better since treatment. No thoughts of harming self or others. No self-medication with alcohol, prescription drugs or illicit drugs. Pt is not following with a counselor/psychologist.  Mixed Hyperlipidemia Patient presents for mixed hyperlipidemia follow up. Currently being treated with Zocor 20 mg/d and compliance with treatment thus far has been good. She denies myalgias. She is usually adhering to a healthy diet. Exercise: active in yard, walking The patient is not known to have coexisting coronary artery disease.  Hypertension Patient presents for hypertension follow up. She does monitor home blood pressures. Blood pressures ranging on average from 120's/70's. She is compliant with medications- Norvasc 10 mg/d Toprol XL 50 mg/d. Patient has these side effects of medication: none Diet/exercise as above.  No Cp or SOB.    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HCC)    Mitral regurgitation    Other iatrogenic hypothyroidism    Skin cancer    basal cell cancers   Unspecified essential hypertension    Unspecified transient cerebral ischemia    Allergies as of 01/13/2023       Reactions   Sulfonamide Derivatives    REACTION: rash        Medication List        Accurate as of January 13, 2023  2:31 PM. If you have any questions, ask your nurse or doctor.          alendronate 70 MG tablet Commonly known as: FOSAMAX TAKE 1 TABLET BY MOUTH ONCE WEEKLY ON AN EMPTY STOMACH BEFORE BREAKFAST. REMAIN UPRIGHT FOR 30 MINUTES AND TAKE WITH 8 OUNCES OF WATER   amLODipine 10 MG tablet Commonly  known as: NORVASC TAKE ONE TABLET BY MOUTH DAILY   Calcium Carb-Cholecalciferol 600-5 MG-MCG Tabs Take 2 tablets by mouth daily.   CENTRUM SILVER PO Take 1 tablet by mouth daily.   cetirizine 5 MG tablet Commonly known as: ZYRTEC Take 5 mg by mouth as needed.   dipyridamole-aspirin 200-25 MG 12hr capsule Commonly known as: AGGRENOX TAKE 1 CAPSULE BY MOUTH TWICE A DAY   fluocinonide 0.05 % external solution Commonly known as: LIDEX Apply 1 Application topically every other day.   furosemide 20 MG tablet Commonly known as: LASIX TAKE 1/2 TABLET BY MOUTH DAILY   ketoconazole 2 % shampoo Commonly known as: NIZORAL Apply topically once a week.   levocetirizine 5 MG tablet Commonly known as: XYZAL Take 5 mg by mouth daily.   levothyroxine 100 MCG tablet Commonly known as: SYNTHROID Take 100 mcg by mouth daily.   LORazepam 0.5 MG tablet Commonly known as: ATIVAN TAKE ONE-HALF TO ONE TABLET BY MOUTH EVERY 12 HOURS AS NEEDED FOR ANXIETY   metoprolol succinate 50 MG 24 hr tablet Commonly known as: TOPROL-XL TAKE 1 TABLET BY MOUTH DAILY WITH OR IMMEDIATELY FOLLOWING A MEAL   PROBIOTIC ACIDOPHILUS PO Take by mouth daily. Reported on 12/03/2015   sertraline 100 MG tablet Commonly known as: ZOLOFT Take 1 tablet (100 mg total) by mouth daily.   simvastatin 20 MG tablet Commonly known as: ZOCOR Take 1 tablet (20 mg  total) by mouth at bedtime.   Vitamin D 50 MCG (2000 UT) Caps Take 1 capsule daily.   vitamin E 180 MG (400 UNITS) capsule Take 400 Units by mouth daily.        Exam BP 116/64 (BP Location: Right Arm, Patient Position: Sitting, Cuff Size: Normal)   Pulse 81   Temp (!) 97.5 F (36.4 C) (Oral)   Resp 16   Ht 5' (1.524 m)   Wt 116 lb 6.4 oz (52.8 kg)   SpO2 96%   BMI 22.73 kg/m  General:  well developed, well nourished, in no apparent distress Heart: RRR, SEM heard loudest at aortic listening post Lungs:  CTAB. No respiratory distress Psych: well  oriented with normal range of affect and age-appropriate judgement/insight, alert and oriented x4.  Assessment and Plan  Anxiety  Mixed hyperlipidemia - Plan: Comprehensive metabolic panel, Lipid panel  Essential hypertension  Chronic, stable. Cont Zoloft 100 mg/d. Wean down to qhs Ativan for 2 weeks and then stop if doing well.  Chronic, stable. Cont Zocor 20 mg/d. Counseled on diet/exercise. Chronic stable. Cont Norvasc 10 mg/d, Toprol XL 50 mg/d.  F/u in 6 mo. The patient voiced understanding and agreement to the plan.  [place], DO 01/13/23 2:31 PM  Good I got turned off

## 2023-01-14 LAB — COMPREHENSIVE METABOLIC PANEL
ALT: 18 U/L (ref 0–35)
AST: 19 U/L (ref 0–37)
Albumin: 4.4 g/dL (ref 3.5–5.2)
Alkaline Phosphatase: 69 U/L (ref 39–117)
BUN: 18 mg/dL (ref 6–23)
CO2: 28 mEq/L (ref 19–32)
Calcium: 9.5 mg/dL (ref 8.4–10.5)
Chloride: 98 mEq/L (ref 96–112)
Creatinine, Ser: 0.67 mg/dL (ref 0.40–1.20)
GFR: 81.09 mL/min (ref >60.00)
Glucose, Bld: 161 mg/dL — ABNORMAL HIGH (ref 70–99)
Potassium: 4.2 mEq/L (ref 3.5–5.1)
Sodium: 134 mEq/L — ABNORMAL LOW (ref 135–145)
Total Bilirubin: 0.4 mg/dL (ref 0.2–1.2)
Total Protein: 7.1 g/dL (ref 6.0–8.3)

## 2023-01-14 LAB — LIPID PANEL
Cholesterol: 159 mg/dL (ref 0–200)
HDL: 56.1 mg/dL (ref >39.00)
LDL Cholesterol: 67 mg/dL (ref 0–99)
NonHDL: 103.34
Total CHOL/HDL Ratio: 3
Triglycerides: 182 mg/dL — ABNORMAL HIGH (ref 0.0–149.0)
VLDL: 36.4 mg/dL (ref 0.0–40.0)

## 2023-01-15 ENCOUNTER — Other Ambulatory Visit: Payer: Self-pay | Admitting: Family Medicine

## 2023-01-15 DIAGNOSIS — I10 Essential (primary) hypertension: Secondary | ICD-10-CM

## 2023-01-15 DIAGNOSIS — E782 Mixed hyperlipidemia: Secondary | ICD-10-CM

## 2023-01-15 DIAGNOSIS — R739 Hyperglycemia, unspecified: Secondary | ICD-10-CM

## 2023-01-27 ENCOUNTER — Telehealth: Payer: Self-pay | Admitting: Family Medicine

## 2023-01-27 NOTE — Telephone Encounter (Signed)
Patient informed of PCP instructions. 

## 2023-01-27 NOTE — Telephone Encounter (Signed)
Let's drop down to 1/2 tab daily for 2 weeks and then stop. Ty.

## 2023-01-27 NOTE — Telephone Encounter (Signed)
Pt has been taking .5 of ativan and would like to know if pcp wanted to decrease that anymore or what the plan is. Please advise.

## 2023-02-01 ENCOUNTER — Ambulatory Visit (INDEPENDENT_AMBULATORY_CARE_PROVIDER_SITE_OTHER): Payer: Medicare Other | Admitting: *Deleted

## 2023-02-01 DIAGNOSIS — Z0000 Encounter for general adult medical examination without abnormal findings: Secondary | ICD-10-CM | POA: Diagnosis not present

## 2023-02-01 NOTE — Patient Instructions (Signed)
[name] , Thank you for taking time to come for your Medicare Wellness Visit. I appreciate your ongoing commitment to your health goals. Please review the following plan we discussed and let me know if I can assist you in the future.    This is a list of the screening recommended for you and due dates:  Health Maintenance  Topic Date Due   COVID-19 Vaccine (4 - 2023-24 season) 07/03/2022   Flu Shot  06/03/2023   Mammogram  09/09/2023   Medicare Annual Wellness Visit  02/01/2024   DTaP/Tdap/Td vaccine (3 - Td or Tdap) 07/30/2031   Pneumonia Vaccine  Completed   DEXA scan (bone density measurement)  Completed   Zoster (Shingles) Vaccine  Completed   HPV Vaccine  Aged Out    Next appointment: Follow up in one year for your annual wellness visit.   Preventive Care 70 Years and Older, Female Preventive care refers to lifestyle choices and visits with your health care provider that can promote health and wellness. What does preventive care include? A yearly physical exam. This is also called an annual well check. Dental exams once or twice a year. Routine eye exams. Ask your health care provider how often you should have your eyes checked. Personal lifestyle choices, including: Daily care of your teeth and gums. Regular physical activity. Eating a healthy diet. Avoiding tobacco and drug use. Limiting alcohol use. Practicing safe sex. Taking low-dose aspirin every day. Taking vitamin and mineral supplements as recommended by your health care provider. What happens during an annual well check? The services and screenings done by your health care provider during your annual well check will depend on your age, overall health, lifestyle risk factors, and family history of disease. Counseling  Your health care provider may ask you questions about your: Alcohol use. Tobacco use. Drug use. Emotional well-being. Home and relationship well-being. Sexual activity. Eating  habits. History of falls. Memory and ability to understand (cognition). Work and work Statistician. Reproductive health. Screening  You may have the following tests or measurements: Height, weight, and BMI. Blood pressure. Lipid and cholesterol levels. These may be checked every 5 years, or more frequently if you are over 65 years old. Skin check. Lung cancer screening. You may have this screening every year starting at age 57 if you have a 30-pack-year history of smoking and currently smoke or have quit within the past 15 years. Fecal occult blood test (FOBT) of the stool. You may have this test every year starting at age 9. Flexible sigmoidoscopy or colonoscopy. You may have a sigmoidoscopy every 5 years or a colonoscopy every 10 years starting at age 48. Hepatitis C blood test. Hepatitis B blood test. Sexually transmitted disease (STD) testing. Diabetes screening. This is done by checking your blood sugar (glucose) after you have not eaten for a while (fasting). You may have this done every 1-3 years. Bone density scan. This is done to screen for osteoporosis. You may have this done starting at age 43. Mammogram. This may be done every 1-2 years. Talk to your health care provider about how often you should have regular mammograms. Talk with your health care provider about your test results, treatment options, and if necessary, the need for more tests. Vaccines  Your health care provider may recommend certain vaccines, such as: Influenza vaccine. This is recommended every year. Tetanus, diphtheria, and acellular pertussis (Tdap, Td) vaccine. You may need a Td booster every 10 years. Zoster vaccine. You may need this  after age 38. Pneumococcal 13-valent conjugate (PCV13) vaccine. One dose is recommended after age 53. Pneumococcal polysaccharide (PPSV23) vaccine. One dose is recommended after age 63. Talk to your health care provider about which screenings and vaccines you need and how  often you need them. This information is not intended to replace advice given to you by your health care provider. Make sure you discuss any questions you have with your health care provider. Document Released: 11/15/2015 Document Revised: 07/08/2016 Document Reviewed: 08/20/2015 Elsevier Interactive Patient Education  2017 [place] Prevention in the Home Falls can cause injuries. They can happen to people of all ages (83). There are many things you can do to make your home safe and to help prevent falls. What can I do on the outside of my home? Regularly fix the edges of walkways and driveways and fix any cracks. Remove anything that might make you trip as you walk through a door, such as a raised step or threshold. Trim any bushes or trees on the path to your home. Use bright outdoor lighting. Clear any walking paths of anything that might make someone trip, such as rocks or tools. Regularly check to see if handrails are loose or broken. Make sure that both sides of any steps have handrails. Any raised decks and porches should have guardrails on the edges. Have any leaves, snow, or ice cleared regularly. Use sand or salt on walking paths during winter. Clean up any spills in your garage right away. This includes oil or grease spills. What can I do in the bathroom? Use night lights. Install grab bars by the toilet and in the tub and shower. Do not use towel bars as grab bars. Use non-skid mats or decals in the tub or shower. If you need to sit down in the shower, use a plastic, non-slip stool. Keep the floor dry. Clean up any water that spills on the floor as soon as it happens. Remove soap buildup in the tub or shower regularly. Attach bath mats securely with double-sided non-slip rug tape. Do not have throw rugs and other things on the floor that can make you trip. What can I do in the bedroom? Use night lights. Make sure that you have a light by your bed that is easy to  reach. Do not use any sheets or blankets that are too big for your bed. They should not hang down onto the floor. Have a firm chair that has side arms. You can use this for support while you get dressed. Do not have throw rugs and other things on the floor that can make you trip. What can I do in the kitchen? Clean up any spills right away. Avoid walking on wet floors. Keep items that you use a lot in easy-to-reach places. If you need to reach something above you, use a strong step stool that has a grab bar. Keep electrical cords out of the way. Do not use floor polish or wax that makes floors slippery. If you must use wax, use non-skid floor wax. Do not have throw rugs and other things on the floor that can make you trip. What can I do with my stairs? Do not leave any items on the stairs. Make sure that there are handrails on both sides of the stairs and use them. Fix handrails that are broken or loose. Make sure that handrails are as long as the stairways. Check any carpeting to make sure that it is firmly attached to the  stairs. Fix any carpet that is loose or worn. Avoid having throw rugs at the top or bottom of the stairs. If you do have throw rugs, attach them to the floor with carpet tape. Make sure that you have a light switch at the top of the stairs and the bottom of the stairs. If you do not have them, ask someone to add them for you. What else can I do to help prevent falls? Wear shoes that: Do not have high heels. Have rubber bottoms. Are comfortable and fit you well. Are closed at the toe. Do not wear sandals. If you use a stepladder: Make sure that it is fully opened. Do not climb a closed stepladder. Make sure that both sides of the stepladder are locked into place. Ask someone to hold it for you, if possible. Clearly mark and make sure that you can see: Any grab bars or handrails. First and last steps. Where the edge of each step is. Use tools that help you move  around (mobility aids) if they are needed. These include: Canes. Walkers. Scooters. Crutches. Turn on the lights when you go into a dark area. Replace any light bulbs as soon as they burn out. Set up your furniture so you have a clear path. Avoid moving your furniture around. If any of your floors are uneven, fix them. If there are any pets around you, be aware of where they are. Review your medicines with your doctor. Some medicines can make you feel dizzy. This can increase your chance of falling. Ask your doctor what other things that you can do to help prevent falls. This information is not intended to replace advice given to you by your health care provider. Make sure you discuss any questions you have with your health care provider. Document Released: 08/15/2009 Document Revised: 03/26/2016 Document Reviewed: 11/23/2014 Elsevier Interactive Patient Education  2017 [employer].

## 2023-02-01 NOTE — Progress Notes (Signed)
Subjective:   [name] is a 82 y.o. female who presents for Medicare Annual (Subsequent) preventive examination.  I connected with  [name] on 02/01/23 by a audio enabled telemedicine application and verified that I am speaking with the correct person using two identifiers.  Patient Location: Home  Provider Location: Office/Clinic  I discussed the limitations of evaluation and management by telemedicine. The patient expressed understanding and agreed to proceed.   Review of Systems     Cardiac Risk Factors include: advanced age (>57men, >61 women);dyslipidemia;hypertension     Objective:    There were no vitals filed for this visit. There is no height or weight on file to calculate BMI.     02/01/2023    3:03 PM 05/06/2022    6:55 AM 01/30/2022    9:42 AM 11/21/2020    3:24 PM 11/14/2019   10:07 AM  Advanced Directives  Does Patient Have a Medical Advance Directive? Yes Yes Yes Yes Yes  Type of Paramedic of [place];Living will [place];Living will [place];Living will [place];Living will [place];Living will  Does patient want to make changes to medical advance directive? No - Patient declined No - Patient declined   No - Patient declined  Copy of [place] in Chart? Yes - validated most recent copy scanned in chart (See row information)  Yes - validated most recent copy scanned in chart (See row information) Yes - validated most recent copy scanned in chart (See row information) Yes - validated most recent copy scanned in chart (See row information)    Current Medications (verified) Outpatient Encounter Medications as of 02/01/2023  Medication Sig   alendronate (FOSAMAX) 70 MG tablet TAKE 1 TABLET BY MOUTH ONCE WEEKLY ON AN EMPTY STOMACH BEFORE BREAKFAST. REMAIN UPRIGHT FOR 30 MINUTES AND TAKE WITH 8 OUNCES OF WATER   amLODipine (NORVASC) 10  MG tablet TAKE 1 TABLET BY MOUTH DAILY   Calcium Carb-Cholecalciferol 600-5 MG-MCG TABS Take 2 tablets by mouth daily.   cetirizine (ZYRTEC) 5 MG tablet Take 5 mg by mouth as needed.   Cholecalciferol (VITAMIN D) 50 MCG (2000 UT) CAPS Take 1 capsule daily.   dipyridamole-aspirin (AGGRENOX) 200-25 MG 12hr capsule TAKE 1 CAPSULE BY MOUTH TWICE A DAY   fluocinonide (LIDEX) 0.05 % external solution Apply 1 Application topically every other day.   furosemide (LASIX) 20 MG tablet TAKE 1/2 TABLET BY MOUTH DAILY   ketoconazole (NIZORAL) 2 % shampoo Apply topically once a week.   Lactobacillus (PROBIOTIC ACIDOPHILUS PO) Take by mouth daily. Reported on 12/03/2015   levocetirizine (XYZAL) 5 MG tablet Take 5 mg by mouth daily.   levothyroxine (SYNTHROID) 100 MCG tablet Take 100 mcg by mouth daily.   LORazepam (ATIVAN) 0.5 MG tablet TAKE ONE-HALF TO ONE TABLET BY MOUTH EVERY 12 HOURS AS NEEDED FOR ANXIETY   metoprolol succinate (TOPROL-XL) 50 MG 24 hr tablet TAKE 1 TABLET BY MOUTH DAILY WITH OR IMMEDIATELY FOLLOWING A MEAL   Multiple Vitamins-Minerals (CENTRUM SILVER PO) Take 1 tablet by mouth daily.   sertraline (ZOLOFT) 100 MG tablet Take 1 tablet (100 mg total) by mouth daily.   simvastatin (ZOCOR) 20 MG tablet Take 1 tablet (20 mg total) by mouth at bedtime.   vitamin E 400 UNIT capsule Take 400 Units by mouth daily.   No facility-administered encounter medications on file as of 02/01/2023.    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  11/04/2012   june 2015   COLONOSCOPY     DILATION AND CURETTAGE OF UTERUS     EYE SURGERY  08/22/2014   POLYPECTOMY     TOTAL THYROIDECTOMY     Family History  Problem Relation Age of Onset   Diabetes Mother    Congestive Heart Failure Mother    Heart disease Mother    Hypertension Mother    Kidney disease Mother    Hypertension Father    CVA Father 34   Early death Father    Stroke Father    CAD Brother        stents placed, CABG   Cancer Brother    Diabetes Brother    Hyperlipidemia Brother    Hypertension Brother    Prostate cancer Brother    Cancer Brother    Diabetes Brother    Early death Brother    Hypertension Brother    Hyperlipidemia Brother    Lung cancer Brother    Cancer Brother    Early death Brother    Hyperlipidemia Brother    Hypertension Brother    Bone cancer Brother    Colon polyps Sister        x 3    Diabetes Sister    Colon cancer Neg Hx    Esophageal cancer Neg Hx    Stomach cancer Neg Hx    Social History   Socioeconomic History   Marital status: Widowed    Spouse name: Jeronica Hires   Number of children: 0   Years of education: Not on file   Highest education level: Not on file  Occupational History   Occupation: Retired    Comment: data entry from [employer] Use   Smoking status: Never   Smokeless tobacco: Never  Vaping Use   Vaping Use: Never used  Substance and Sexual Activity   Alcohol use: No    Alcohol/week: 0.0 standard drinks of alcohol   Drug use: No   Sexual activity: Not Currently  Other Topics Concern   Not on file  Social History Narrative   Widow since 09/2018.    Lost Brother in Jan 2019 and 2 sisters in Dec 2019.   2 sister living.    Social Determinants of Health   Financial Resource Strain: Low Risk  (01/30/2022)   Overall Financial Resource Strain (CARDIA)    Difficulty of Paying Living Expenses: Not hard at all  Food Insecurity: No Food  Insecurity (02/01/2023)   Hunger Vital Sign    Worried About Running Out of Food in the Last Year: Never true    Ran Out of Food in the Last Year: Never true  Transportation Needs: No Transportation Needs (02/01/2023)   PRAPARE - Hydrologist (Medical): No    Lack of Transportation (Non-Medical): No  Physical Activity: Sufficiently Active (01/30/2022)   Exercise Vital Sign    Days of  Exercise per Week: 5 days    Minutes of Exercise per Session: 30 min  Stress: Stress Concern Present (01/30/2022)   [place]    Feeling of Stress : To some extent  Social Connections: Moderately Integrated (01/30/2022)   Social Connection and Isolation Panel [NHANES]    Frequency of Communication with Friends and Family: More than three times a week    Frequency of Social Gatherings with Friends and Family: More than three times a week    Attends Religious Services: More than 4 times per year    Active Member of [employer] or Organizations: Yes    Attends Archivist Meetings: More than 4 times per year    Marital Status: Widowed    Tobacco Counseling Counseling given: Not Answered   Clinical Intake:  Pre-visit preparation completed: Yes  Pain : No/denies pain  Nutritional Status: BMI of 19-24  Normal Nutritional Risks: None Diabetes: No  How often do you need to have someone help you when you read instructions, pamphlets, or other written materials from your doctor or pharmacy?: 1 - Never   Activities of Daily Living    02/01/2023    3:14 PM  In your present state of health, do you have any difficulty performing the following activities:  Hearing? 1  Comment has noticed some slight hearing loss  Vision? 0  Difficulty concentrating or making decisions? 0  Walking or climbing stairs? 0  Dressing or bathing? 0  Doing errands, shopping? 0  Preparing Food and eating ? N  Using the Toilet? N  In  the past six months, have you accidently leaked urine? Y  Do you have problems with loss of bowel control? N  Managing your Medications? N  Managing your Finances? N  Housekeeping or managing your Housekeeping? N    Patient Care Team: Shelda Pal, DO as PCP - General (Family Medicine) Minus Breeding, MD as PCP - Cardiology (Cardiology) Delrae Rend, MD as Consulting Physician (Endocrinology) Rutherford Guys, MD as Consulting Physician (Ophthalmology) Minus Breeding, MD as Consulting Physician (Cardiology)  Indicate any recent Medical Services you may have received from other than [facility] providers in the past year (date may be approximate).     Assessment:   This is a routine wellness examination for [name].  Hearing/Vision screen No results found.  Dietary issues and exercise activities discussed: Current Exercise Habits: The patient does not participate in regular exercise at present, Exercise limited by: None identified   Goals Addressed   None    Depression Screen    02/01/2023    3:13 PM 01/13/2023    2:01 PM 01/30/2022    9:38 AM 11/21/2020    3:31 PM 11/14/2019   10:18 AM 05/24/2014    8:00 AM 05/11/2013    8:00 AM  PHQ 2/9 Scores  PHQ - 2 Score 0 0 0 0 0 0 0  PHQ- 9 Score  0         Fall Risk    02/01/2023    3:10 PM 01/13/2023    2:01 PM 01/30/2022    9:43 AM 11/21/2020    3:27 PM 11/14/2019   10:18 AM  [place] in the past year? 1 0 0 0 1  Number falls in past yr: 0 0 0 0 0  Injury with Fall? 0 0 0 0 0  Risk for fall due to : No Fall Risks  No Fall Risks  Follow up Falls evaluation completed Falls evaluation completed Education provided Falls prevention discussed Education provided;Falls prevention discussed    FALL RISK PREVENTION PERTAINING TO THE HOME:  Any stairs in or around the home? No  Home free of loose throw rugs in walkways, pet beds, electrical cords, etc? Yes  Adequate lighting in your home to reduce risk of falls? Yes    ASSISTIVE DEVICES UTILIZED TO PREVENT FALLS:  Life alert? No  Use of a cane, walker or w/c? No  Grab bars in the bathroom? Yes  Shower chair or bench in shower? Yes  Elevated toilet seat or a handicapped toilet? No   TIMED UP AND GO:  Was the test performed?  No, audio visit .   Cognitive Function:        02/01/2023    3:24 PM 01/30/2022    9:47 AM 11/14/2019   10:20 AM  6CIT Screen  What Year? 0 points 0 points 0 points  What month? 0 points 0 points 0 points  What time? 0 points 0 points 0 points  Count back from 20 0 points 0 points 0 points  Months in reverse 0 points 0 points 0 points  Repeat phrase 0 points 0 points 2 points  Total Score 0 points 0 points 2 points    Immunizations Immunization History  Administered Date(s) Administered   Fluad Quad(high Dose 65+) 08/18/2019, 08/13/2021   H1N1 10/09/2008   Influenza Split 07/20/2011, 07/14/2012   Influenza Whole 07/25/2008, 08/09/2009, 07/25/2010   Influenza, High Dose Seasonal PF 08/21/2016, 08/20/2017, 07/20/2018, 08/08/2020, 08/10/2022   Influenza,inj,Quad PF,6+ Mos 07/26/2013, 07/30/2014, 07/30/2015   PFIZER(Purple Top)SARS-COV-2 Vaccination 11/21/2019, 12/12/2019, 09/04/2020   Pneumococcal Conjugate-13 05/24/2014   Pneumococcal Polysaccharide-23 11/02/2006   Td 02/27/2009   Tdap 07/29/2021   Zoster Recombinat (Shingrix) 09/01/2017, 11/01/2017, 11/12/2017   Zoster, Live 11/02/2010    TDAP status: Up to date  Flu Vaccine status: Up to date  Pneumococcal vaccine status: Up to date  Covid-19 vaccine status: Information provided on how to obtain vaccines.   Qualifies for Shingles Vaccine? Yes   Zostavax completed Yes   Shingrix Completed?: Yes  Screening Tests Health Maintenance  Topic Date Due   COVID-19 Vaccine (4 - 2023-24 season) 07/03/2022   Medicare Annual Wellness (AWV)  01/31/2023   INFLUENZA VACCINE  06/03/2023   MAMMOGRAM  09/09/2023   DTaP/Tdap/Td (3 - Td or Tdap) 07/30/2031    Pneumonia Vaccine 51+ Years old  Completed   DEXA SCAN  Completed   Zoster Vaccines- Shingrix  Completed   HPV VACCINES  Aged Out    Health Maintenance  Health Maintenance Due  Topic Date Due   COVID-19 Vaccine (4 - 2023-24 season) 07/03/2022   Medicare Annual Wellness (AWV)  01/31/2023    Colorectal cancer screening: No longer required.   Mammogram status: Completed 09/08/22. Repeat every year  Bone Density status: Completed 09/08/22. Results reflect: Bone density results: OSTEOPOROSIS. Repeat every 2 years.  Lung Cancer Screening: (Low Dose CT Chest recommended if Age 100-80 years, 30 pack-year currently smoking OR have quit w/in 15years.) does not qualify.   Additional Screening:  Hepatitis C Screening: does not qualify  Vision Screening: Recommended annual ophthalmology exams for early detection of glaucoma and other disorders of the eye. Is the patient up to date with their annual eye exam?  Yes  Who is the provider or what is the name of the office in which the patient attends annual eye exams? Dr. Gershon Crane If pt is not  established with a provider, would they like to be referred to a provider to establish care? No .   Dental Screening: Recommended annual dental exams for proper oral hygiene  Community Resource Referral / Chronic Care Management: CRR required this visit?  No   CCM required this visit?  No      Plan:     I have personally reviewed and noted the following in the patient's chart:   Medical and social history Use of alcohol, tobacco or illicit drugs  Current medications and supplements including opioid prescriptions. Patient is not currently taking opioid prescriptions. Functional ability and status Nutritional status Physical activity Advanced directives List of other physicians Hospitalizations, surgeries, and ER visits in previous 12 months Vitals Screenings to include cognitive, depression, and falls Referrals and appointments  In addition,  I have reviewed and discussed with patient certain preventive protocols, quality metrics, and best practice recommendations. A written personalized care plan for preventive services as well as general preventive health recommendations were provided to patient.   Due to this being a telephonic visit, the after visit summary with patients personalized plan was offered to patient via mail or my-chart. Per request, patient was mailed a copy of AVS.   Beatris Ship, [place]   02/01/2023   Nurse Notes: None

## 2023-02-02 ENCOUNTER — Other Ambulatory Visit: Payer: Self-pay | Admitting: Family Medicine

## 2023-02-02 DIAGNOSIS — E782 Mixed hyperlipidemia: Secondary | ICD-10-CM

## 2023-02-02 DIAGNOSIS — I10 Essential (primary) hypertension: Secondary | ICD-10-CM

## 2023-02-02 DIAGNOSIS — I679 Cerebrovascular disease, unspecified: Secondary | ICD-10-CM

## 2023-02-10 ENCOUNTER — Telehealth: Payer: Self-pay | Admitting: Family Medicine

## 2023-02-10 NOTE — Telephone Encounter (Signed)
Patient informed of PCP instructions. 

## 2023-02-10 NOTE — Telephone Encounter (Signed)
Pt said she is going to stop the ativan for 2 weeks and is doing good but wasn't sure if Dr. Carmelia Roller needed her to adjust the sertraline dosage at all. Please call to advise next steps.

## 2023-02-26 ENCOUNTER — Other Ambulatory Visit (INDEPENDENT_AMBULATORY_CARE_PROVIDER_SITE_OTHER): Payer: Medicare Other

## 2023-02-26 DIAGNOSIS — R739 Hyperglycemia, unspecified: Secondary | ICD-10-CM | POA: Diagnosis not present

## 2023-02-26 DIAGNOSIS — E782 Mixed hyperlipidemia: Secondary | ICD-10-CM | POA: Diagnosis not present

## 2023-02-26 LAB — LIPID PANEL
Cholesterol: 158 mg/dL (ref 0–200)
HDL: 57.2 mg/dL (ref >39.00)
LDL Cholesterol: 81 mg/dL (ref 0–99)
NonHDL: 100.42
Total CHOL/HDL Ratio: 3
Triglycerides: 99 mg/dL (ref 0.0–149.0)
VLDL: 19.8 mg/dL (ref 0.0–40.0)

## 2023-02-26 LAB — HEMOGLOBIN A1C: Hgb A1c MFr Bld: 6 % (ref 4.6–6.5)

## 2023-03-24 DIAGNOSIS — D485 Neoplasm of uncertain behavior of skin: Secondary | ICD-10-CM | POA: Diagnosis not present

## 2023-03-24 DIAGNOSIS — L448 Other specified papulosquamous disorders: Secondary | ICD-10-CM | POA: Diagnosis not present

## 2023-03-24 DIAGNOSIS — L400 Psoriasis vulgaris: Secondary | ICD-10-CM | POA: Diagnosis not present

## 2023-03-24 DIAGNOSIS — L821 Other seborrheic keratosis: Secondary | ICD-10-CM | POA: Diagnosis not present

## 2023-03-24 DIAGNOSIS — L989 Disorder of the skin and subcutaneous tissue, unspecified: Secondary | ICD-10-CM | POA: Diagnosis not present

## 2023-03-24 DIAGNOSIS — L298 Other pruritus: Secondary | ICD-10-CM | POA: Diagnosis not present

## 2023-04-30 DIAGNOSIS — H26493 Other secondary cataract, bilateral: Secondary | ICD-10-CM | POA: Diagnosis not present

## 2023-04-30 DIAGNOSIS — H43813 Vitreous degeneration, bilateral: Secondary | ICD-10-CM | POA: Diagnosis not present

## 2023-04-30 DIAGNOSIS — H18453 Nodular corneal degeneration, bilateral: Secondary | ICD-10-CM | POA: Diagnosis not present

## 2023-04-30 DIAGNOSIS — H52203 Unspecified astigmatism, bilateral: Secondary | ICD-10-CM | POA: Diagnosis not present

## 2023-04-30 DIAGNOSIS — H524 Presbyopia: Secondary | ICD-10-CM | POA: Diagnosis not present

## 2023-04-30 DIAGNOSIS — H04123 Dry eye syndrome of bilateral lacrimal glands: Secondary | ICD-10-CM | POA: Diagnosis not present

## 2023-04-30 DIAGNOSIS — H5203 Hypermetropia, bilateral: Secondary | ICD-10-CM | POA: Diagnosis not present

## 2023-04-30 DIAGNOSIS — Z961 Presence of intraocular lens: Secondary | ICD-10-CM | POA: Diagnosis not present

## 2023-05-03 ENCOUNTER — Other Ambulatory Visit: Payer: Self-pay | Admitting: Family Medicine

## 2023-05-03 DIAGNOSIS — I679 Cerebrovascular disease, unspecified: Secondary | ICD-10-CM

## 2023-05-12 DIAGNOSIS — C44712 Basal cell carcinoma of skin of right lower limb, including hip: Secondary | ICD-10-CM | POA: Diagnosis not present

## 2023-06-09 ENCOUNTER — Ambulatory Visit (HOSPITAL_COMMUNITY)
Admission: RE | Admit: 2023-06-09 | Discharge: 2023-06-09 | Disposition: A | Discharge status: Home / Self Care | Payer: Medicare Other | Source: Ambulatory Visit | Attending: Cardiology | Admitting: Cardiology

## 2023-06-09 DIAGNOSIS — I6529 Occlusion and stenosis of unspecified carotid artery: Secondary | ICD-10-CM | POA: Diagnosis not present

## 2023-06-09 DIAGNOSIS — I6523 Occlusion and stenosis of bilateral carotid arteries: Secondary | ICD-10-CM

## 2023-06-09 DIAGNOSIS — I350 Nonrheumatic aortic (valve) stenosis: Secondary | ICD-10-CM

## 2023-07-08 ENCOUNTER — Ambulatory Visit (HOSPITAL_COMMUNITY): Payer: Medicare Other | Attending: Cardiology

## 2023-07-08 DIAGNOSIS — I6529 Occlusion and stenosis of unspecified carotid artery: Secondary | ICD-10-CM | POA: Diagnosis not present

## 2023-07-08 DIAGNOSIS — I350 Nonrheumatic aortic (valve) stenosis: Secondary | ICD-10-CM | POA: Insufficient documentation

## 2023-07-08 LAB — ECHOCARDIOGRAM COMPLETE
AR max vel: 0.44 cm2
AV Area VTI: 0.44 cm2
AV Area mean vel: 0.42 cm2
AV Mean grad: 48 mmHg
AV Peak grad: 72.8 mmHg
Ao pk vel: 4.27 m/s
Area-P 1/2: 3.5 cm2
S' Lateral: 2.1 cm

## 2023-07-21 ENCOUNTER — Encounter: Payer: Self-pay | Admitting: Family Medicine

## 2023-07-21 ENCOUNTER — Ambulatory Visit (INDEPENDENT_AMBULATORY_CARE_PROVIDER_SITE_OTHER): Payer: Medicare Other | Admitting: Family Medicine

## 2023-07-21 VITALS — BP 138/82 | HR 45 | Temp 98.6°F | Ht 60.0 in | Wt 114.5 lb

## 2023-07-21 DIAGNOSIS — I679 Cerebrovascular disease, unspecified: Secondary | ICD-10-CM | POA: Diagnosis not present

## 2023-07-21 DIAGNOSIS — M810 Age-related osteoporosis without current pathological fracture: Secondary | ICD-10-CM | POA: Diagnosis not present

## 2023-07-21 DIAGNOSIS — F411 Generalized anxiety disorder: Secondary | ICD-10-CM | POA: Diagnosis not present

## 2023-07-21 DIAGNOSIS — E782 Mixed hyperlipidemia: Secondary | ICD-10-CM

## 2023-07-21 DIAGNOSIS — I10 Essential (primary) hypertension: Secondary | ICD-10-CM | POA: Diagnosis not present

## 2023-07-21 MED ORDER — ALENDRONATE SODIUM 70 MG PO TABS: 70.0000 mg | ORAL_TABLET | ORAL | 3 refills | Status: DC

## 2023-07-21 MED ORDER — AMLODIPINE BESYLATE 10 MG PO TABS
10.0000 mg | ORAL_TABLET | Freq: Every day | ORAL | 2 refills | Status: DC
Start: 2023-07-21 — End: 2023-10-08

## 2023-07-21 MED ORDER — ASPIRIN-DIPYRIDAMOLE ER 25-200 MG PO CP12: 1.0000 | ORAL_CAPSULE | Freq: Two times a day (BID) | ORAL | 11 refills | Status: DC

## 2023-07-21 MED ORDER — METOPROLOL SUCCINATE ER 50 MG PO TB24: ORAL_TABLET | ORAL | 3 refills | Status: DC

## 2023-07-21 MED ORDER — SIMVASTATIN 20 MG PO TABS
20.0000 mg | ORAL_TABLET | Freq: Every day | ORAL | 3 refills | Status: DC
Start: 2023-07-21 — End: 2024-07-26

## 2023-07-21 MED ORDER — FUROSEMIDE 20 MG PO TABS: ORAL_TABLET | ORAL | 2 refills | Status: DC

## 2023-07-21 NOTE — Progress Notes (Signed)
Chief Complaint  Patient presents with   Follow-up    Sinus drainage today    Subjective [name] is a 82 y.o. female who presents for hypertension follow up. She does not monitor home blood pressures. She is compliant with medications- Norvasc 10 mg/d, Toprol XL 50 mg daily. Patient has these side effects of medication: none She is adhering to a healthy diet overall. Current exercise: walking No CP or SOB.   GAD Taking Zoloft 100 mg/d. Reports compliance, no AE's. Doing well. Not following w therapist/counselor. No SI or HI. No self medication. No longer on Ativan.   Hyperlipidemia Patient presents for mixed hyperlipidemia follow up. Currently being treated with Zocor 20 mg/d and compliance with treatment thus far has been good. She denies myalgias. Diet/exercise as above.  The patient is not known to have coexisting coronary artery disease. She does have a history of cerebrovascular disease and is taking Aggrenox for it.  Osteoporosis The patient has been on Fosamax 70 mg weekly since August 2021.  The plan is to keep her on it for 5 total years and then recheck a DEXA scan.  She is compliant and reports no adverse effects.  She is compliant with calcium and vitamin D supplementation.  She walks daily.   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HCC)    Mitral regurgitation    Other iatrogenic hypothyroidism    Skin cancer    basal cell cancers   Unspecified essential hypertension    Unspecified transient cerebral ischemia     Exam BP 138/82 (BP Location: Left Arm, Patient Position: Sitting, Cuff Size: Normal)   Pulse (!) 45   Temp 98.6 F (37 C) (Oral)   Ht 5' (1.524 m)   Wt 114 lb 8 oz (51.9 kg)   SpO2 97%   BMI 22.36 kg/m  General:  well developed, well nourished, in no apparent distress Heart:  Regular rhythm, bradycardic, no bruits, no LE edema Lungs: clear to auscultation, no accessory muscle use Psych: well oriented with normal range of affect and appropriate judgment/insight  Essential hypertension - Plan: metoprolol succinate (TOPROL-XL) 50 MG 24 hr tablet, amLODipine (NORVASC) 10 MG tablet  GAD (generalized anxiety disorder)  Mixed hyperlipidemia - Plan: simvastatin (ZOCOR) 20 MG tablet  Cerebrovascular disease, unspecified - Plan: dipyridamole-aspirin (AGGRENOX) 200-25 MG 12hr capsule  Age-related osteoporosis without current pathological fracture  Chronic, stable. Cont Toprol XL 50 mg/d, Norvasc 10 mg/d. Counseled on diet and exercise. Chronic, stable.  Continue Zoloft 100 mg daily. Chronic, stable.  Continue simvastatin 20 mg daily. Chronic, stable.  Continue Aggrenox 200-25 mg twice daily. Chronic, stable.  Continue calcium and vitamin D supplementation, Fosamax 70 mg daily.  Will stop in August 2026.  Weightbearing exercise recommended. F/u in 6 mo. The patient voiced understanding and agreement to the plan.  Jilda Roche [place], DO 07/21/23  5:02 PM

## 2023-07-21 NOTE — Patient Instructions (Signed)
Go back on Zyrtec.   Keep the diet clean and stay active.  Let us know if you need anything.

## 2023-07-28 NOTE — Progress Notes (Unsigned)
Cardiology Office Note:   Date:  07/29/2023  ID:  [name], DOB 17-Sep-1941, MRN [id number] PCP: Sharlene Dory, DO  [facility] Providers Cardiologist:  Rollene Rotunda, MD {  History of Present Illness:   [name] is a 82 y.o. female for follow up of moderate AS on echo in Sept.  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      Since I last saw her she denies any new symptoms.  She says she vacuums.  She still does her yard work.  She lives alone. The patient denies any new symptoms such as chest discomfort, neck or arm discomfort. There has been no new shortness of breath, PND or orthopnea. There have been no reported palpitations, presyncope or syncope.   ROS: As stated in the HPI and negative for all other systems.  Studies Reviewed:    EKG:   EKG Interpretation Date/Time:  Thursday July 29 2023 14:16:26 EDT Ventricular Rate:  82 PR Interval:  186 QRS Duration:  86 QT Interval:  382 QTC Calculation: 446 R Axis:   41  Text Interpretation: Normal sinus rhythm Possible Left atrial enlargement When compared with ECG of 06-May-2022 06:55, No significant change since last tracing Confirmed by Rollene Rotunda ([id number]) on 07/29/2023 2:19:34 PM    Risk Assessment/Calculations:              Physical Exam:   VS:  BP (!) 116/52   Pulse 82   Ht 5' (1.524 m)   Wt 115 lb (52.2 kg)   SpO2 96%   BMI 22.46 kg/m    Wt Readings from Last 3 Encounters:  07/29/23 115 lb (52.2 kg)  07/21/23 114 lb 8 oz (51.9 kg)  01/13/23 116 lb 6.4 oz (52.8 kg)     GEN: Well nourished, well developed in no acute distress NECK: No JVD; No carotid bruits CARDIAC: RRR, no murmurs, rubs, gallops RESPIRATORY:  Clear to auscultation without rales, wheezing or rhonchi  ABDOMEN: Soft, non-tender, non-distended EXTREMITIES:  No edema; No deformity   ASSESSMENT AND PLAN:   AS:   This is severe in Sept. 2024.  The mean gradient is now 48 with a  valve area of 0.44.  However, she reports no symptoms.    At this point I am going to put her on a treadmill to see how she does and make sure there is no high risk findings and I will follow this very closely if she really is asymptomatic.  She would prefer to delay TAVR further if possible.   HTN:   BP is at target.  No change in therapy  HLD:  LDL was 81 with an HDL of 57.  No change in therapy. =    TIA:   She will continue with Aggrenox.  She had mild plaque on Carotid Doppler in 8 /2024.  No change in therapy.     Follow up with me in six months.   Signed, Rollene Rotunda, MD

## 2023-07-29 ENCOUNTER — Encounter: Payer: Self-pay | Admitting: Cardiology

## 2023-07-29 ENCOUNTER — Ambulatory Visit: Payer: Medicare Other | Attending: Cardiology | Admitting: Cardiology

## 2023-07-29 VITALS — BP 116/52 | HR 82 | Ht 60.0 in | Wt 115.0 lb

## 2023-07-29 DIAGNOSIS — I350 Nonrheumatic aortic (valve) stenosis: Secondary | ICD-10-CM | POA: Diagnosis not present

## 2023-07-29 DIAGNOSIS — I10 Essential (primary) hypertension: Secondary | ICD-10-CM | POA: Diagnosis not present

## 2023-07-29 DIAGNOSIS — E785 Hyperlipidemia, unspecified: Secondary | ICD-10-CM | POA: Insufficient documentation

## 2023-07-29 NOTE — Patient Instructions (Signed)
Medication Instructions:  NO CHANGES  *If you need a refill on your cardiac medications before your next appointment, please call your pharmacy*   Testing/Procedures: Dr. Antoine Poche has ordered an exercise tolerance test. This will be scheduled at [facility] at [address] [employer]  Exercise Stress Test An exercise stress test is a test to check how your heart works during exercise. You will need to walk on a treadmill or ride an exercise bike for this test. An electrocardiogram (ECG) will record your heartbeat when you are at rest and when you are exercising. You may have an ultrasound or nuclear scan after the exercise test. The test is done to check for coronary artery disease (CAD). It is also done to: See how well you can exercise. Watch for high blood pressure during exercise. Test how well you can exercise after treatment. Check the blood flow to your arms and legs. If your test result is not normal, more testing may be needed. Tell a doctor about: Any allergies you have. All medicines you are taking. This includes vitamins, herbs, eye drops, creams, and over-the-counter medicines. Any surgeries you have had. Tell your doctor if you have a pacemaker or a defibrillator called an ICD. Any bleeding problems you have. Any medical conditions you have. Whether you are pregnant or may be pregnant. What are the risks? Pain or pressure in the following areas: Chest. Jaw or neck. Between your shoulder blades. Down your left arm. Legs. Feeling dizzy or light-headed. Shortness of breath. Irregular heartbeat. Feeling like you may vomit (nausea) or vomiting. What happens before the test? Follow instructions from your doctor about what you cannot eat or drink. Do not have any drinks or foods that have caffeine in them for 24 hours before the test, or as told by your doctor. This includes coffee, tea (even decaf tea), sodas, chocolate, and cocoa. Ask your doctor about  changing or stopping: Over-the-counter medicines. Vitamins, herbs, and supplements. Your normal medicines. This is important if: You take diabetes medicines. Ask how to take insulin or pills. You may be told to change your insulin dose the morning of the test. You take beta-blocker medicines. These medicines may cause problems in your test. You may be told to stop taking them 24 hours before the test. You wear a nitroglycerin patch. The patch may need to be taken off before the test. Do not smoke or use any products that contain nicotine or tobacco for 4 hours before the test. If you need help quitting, ask your doctor. If you use an inhaler, bring it with you to the test. Do not put lotions, powders, creams, or oils on your chest before the test. Wear comfortable shoes and loose-fitting clothing. What happens during the test?  Patches (electrodes) will be put on your chest. Wires will be connected to the patches. The wires will send signals to a machine to record your heartbeat. Your heart rate will be watched while you are resting and while you are exercising. Your blood pressure and oxygen levels will also be watched during the test. You will walk on a treadmill or use an exercise bike. If you cannot use these, you may be asked to turn a crank with your hands. The activity will get harder and will raise your heart rate. You may be asked to breathe into a tube a few times during the test. This measures the gases that you breathe out. You will be asked how you are feeling throughout the  test. You will exercise until your heart reaches a target heart rate. You will stop early if: You have chest pain. You feel dizzy. You are out of breath. You are too tired to keep going. Your blood pressure is too high or too low. You have an irregular heartbeat. You have pain or aching in your arms or legs. The test may vary among doctors and hospitals. What can I expect after the test? You will be  monitored until you leave the hospital or clinic. This includes checking your blood pressure, heart rate, breathing rate, and blood oxygen level. You may return to your normal diet and activities as told by your doctor. It is up to you to get the results of your test. Ask how to get your results when they are ready. Summary An exercise stress test is a test to check how your heart works during exercise. This test is done to check for coronary artery disease. Your heart rate will be watched while you are resting and while you are exercising. Follow instructions from your doctor about what you cannot eat or drink before the test. This information is not intended to replace advice given to you by your health care provider. Make sure you discuss any questions you have with your health care provider. Document Revised: 09/02/2021 Document Reviewed: 09/02/2021 Elsevier Patient Education  2024 Elsevier Inc.    Follow-Up: At [facility], you and your health needs are our priority.  As part of our continuing mission to provide you with exceptional heart care, we have created designated Provider Care Teams.  These Care Teams include your primary Cardiologist (physician) and Advanced Practice Providers (APPs -  Physician Assistants and Nurse Practitioners) who all work together to provide you with the care you need, when you need it.  We recommend signing up for the patient portal called "MyChart".  Sign up information is provided on this After Visit Summary.  MyChart is used to connect with patients for Virtual Visits (Telemedicine).  Patients are able to view lab/test results, encounter notes, upcoming appointments, etc.  Non-urgent messages can be sent to your provider as well.   To learn more about what you can do with MyChart, go to [email or web address].    Your next appointment:    6 months with Dr. Antoine Poche

## 2023-08-02 ENCOUNTER — Other Ambulatory Visit: Payer: Self-pay | Admitting: Family Medicine

## 2023-08-02 DIAGNOSIS — I679 Cerebrovascular disease, unspecified: Secondary | ICD-10-CM

## 2023-08-05 ENCOUNTER — Ambulatory Visit: Payer: Medicare Other

## 2023-08-05 DIAGNOSIS — R7303 Prediabetes: Secondary | ICD-10-CM | POA: Diagnosis not present

## 2023-08-05 DIAGNOSIS — E890 Postprocedural hypothyroidism: Secondary | ICD-10-CM | POA: Diagnosis not present

## 2023-08-05 DIAGNOSIS — Z85850 Personal history of malignant neoplasm of thyroid: Secondary | ICD-10-CM | POA: Diagnosis not present

## 2023-08-06 ENCOUNTER — Telehealth: Payer: Self-pay | Admitting: Family Medicine

## 2023-08-06 MED ORDER — AZITHROMYCIN 250 MG PO TABS: ORAL_TABLET | ORAL | 0 refills | Status: DC

## 2023-08-06 NOTE — Addendum Note (Signed)
Addended by: Radene Gunning on: 08/06/2023 11:54 AM   Modules accepted: Orders

## 2023-08-06 NOTE — Telephone Encounter (Signed)
Let's try another med. If this doesn't help, I want to see her next week. Ty.

## 2023-08-06 NOTE — Telephone Encounter (Signed)
Pt called stating that she is still having issues with the phlegm and congestion that she had addressed with Dr. Carmelia Roller. Pt stated that she has tried taking mucinex and zyrtec to try and remedy the issue per his instruction. Pt stated that it helped a bit at first but has worsened in comparison to when she saw him. Pt would like to know if there is any other advise Dr. Carmelia Roller could give in this regard.

## 2023-08-06 NOTE — Telephone Encounter (Signed)
Patient informed and verbalized understanding

## 2023-08-10 ENCOUNTER — Ambulatory Visit: Payer: Medicare Other | Attending: Cardiology

## 2023-08-10 DIAGNOSIS — I350 Nonrheumatic aortic (valve) stenosis: Secondary | ICD-10-CM | POA: Diagnosis not present

## 2023-08-12 DIAGNOSIS — R7303 Prediabetes: Secondary | ICD-10-CM | POA: Diagnosis not present

## 2023-08-12 DIAGNOSIS — Z833 Family history of diabetes mellitus: Secondary | ICD-10-CM | POA: Diagnosis not present

## 2023-08-12 DIAGNOSIS — Z85850 Personal history of malignant neoplasm of thyroid: Secondary | ICD-10-CM | POA: Diagnosis not present

## 2023-08-12 DIAGNOSIS — E890 Postprocedural hypothyroidism: Secondary | ICD-10-CM | POA: Diagnosis not present

## 2023-08-12 LAB — EXERCISE TOLERANCE TEST
Angina Index: 0
Duke Treadmill Score: 4
Estimated workload: 4.6
Exercise duration (min): 3 min
Exercise duration (sec): 48 s
MPHR: 137 {beats}/min
Peak HR: 108 {beats}/min
Percent HR: 78 %
RPE: 15
Rest HR: 77 {beats}/min
ST Depression (mm): 0 mm

## 2023-08-25 DIAGNOSIS — Z1231 Encounter for screening mammogram for malignant neoplasm of breast: Secondary | ICD-10-CM | POA: Diagnosis not present

## 2023-08-26 ENCOUNTER — Encounter: Payer: Self-pay | Admitting: Family Medicine

## 2023-09-09 LAB — HM MAMMOGRAPHY

## 2023-09-10 ENCOUNTER — Encounter: Payer: Self-pay | Admitting: Family Medicine

## 2023-09-24 ENCOUNTER — Ambulatory Visit (INDEPENDENT_AMBULATORY_CARE_PROVIDER_SITE_OTHER): Payer: Medicare Other

## 2023-09-24 DIAGNOSIS — Z23 Encounter for immunization: Secondary | ICD-10-CM

## 2023-09-29 DIAGNOSIS — L814 Other melanin hyperpigmentation: Secondary | ICD-10-CM | POA: Diagnosis not present

## 2023-09-29 DIAGNOSIS — D225 Melanocytic nevi of trunk: Secondary | ICD-10-CM | POA: Diagnosis not present

## 2023-09-29 DIAGNOSIS — L821 Other seborrheic keratosis: Secondary | ICD-10-CM | POA: Diagnosis not present

## 2023-09-29 DIAGNOSIS — Z85828 Personal history of other malignant neoplasm of skin: Secondary | ICD-10-CM | POA: Diagnosis not present

## 2023-09-29 DIAGNOSIS — Z08 Encounter for follow-up examination after completed treatment for malignant neoplasm: Secondary | ICD-10-CM | POA: Diagnosis not present

## 2023-09-29 DIAGNOSIS — L408 Other psoriasis: Secondary | ICD-10-CM | POA: Diagnosis not present

## 2023-10-06 ENCOUNTER — Other Ambulatory Visit: Payer: Self-pay | Admitting: Family Medicine

## 2023-10-06 DIAGNOSIS — F419 Anxiety disorder, unspecified: Secondary | ICD-10-CM

## 2023-10-08 ENCOUNTER — Other Ambulatory Visit: Payer: Self-pay | Admitting: Family Medicine

## 2023-10-08 DIAGNOSIS — I10 Essential (primary) hypertension: Secondary | ICD-10-CM

## 2023-11-23 ENCOUNTER — Ambulatory Visit: Payer: Medicare Other | Admitting: Cardiology

## 2024-01-19 ENCOUNTER — Ambulatory Visit (HOSPITAL_BASED_OUTPATIENT_CLINIC_OR_DEPARTMENT_OTHER)
Admission: RE | Admit: 2024-01-19 | Discharge: 2024-01-19 | Disposition: A | Discharge status: Home / Self Care | Source: Ambulatory Visit | Attending: Family Medicine | Admitting: Family Medicine

## 2024-01-19 ENCOUNTER — Encounter: Payer: Self-pay | Admitting: Family Medicine

## 2024-01-19 ENCOUNTER — Ambulatory Visit (INDEPENDENT_AMBULATORY_CARE_PROVIDER_SITE_OTHER): Payer: Medicare Other | Admitting: Family Medicine

## 2024-01-19 VITALS — BP 130/60 | HR 73 | Temp 97.7°F | Ht 60.0 in | Wt 115.6 lb

## 2024-01-19 DIAGNOSIS — I10 Essential (primary) hypertension: Secondary | ICD-10-CM | POA: Diagnosis not present

## 2024-01-19 DIAGNOSIS — E782 Mixed hyperlipidemia: Secondary | ICD-10-CM

## 2024-01-19 DIAGNOSIS — M810 Age-related osteoporosis without current pathological fracture: Secondary | ICD-10-CM | POA: Diagnosis not present

## 2024-01-19 DIAGNOSIS — F411 Generalized anxiety disorder: Secondary | ICD-10-CM | POA: Diagnosis not present

## 2024-01-19 DIAGNOSIS — M25562 Pain in left knee: Secondary | ICD-10-CM | POA: Insufficient documentation

## 2024-01-19 LAB — COMPREHENSIVE METABOLIC PANEL
ALT: 18 U/L (ref 0–35)
AST: 19 U/L (ref 0–37)
Albumin: 4.8 g/dL (ref 3.5–5.2)
Alkaline Phosphatase: 55 U/L (ref 39–117)
BUN: 12 mg/dL (ref 6–23)
CO2: 30 meq/L (ref 19–32)
Calcium: 9.9 mg/dL (ref 8.4–10.5)
Chloride: 97 meq/L (ref 96–112)
Creatinine, Ser: 0.58 mg/dL (ref 0.40–1.20)
GFR: 83.36 mL/min (ref >60.00)
Glucose, Bld: 106 mg/dL — ABNORMAL HIGH (ref 70–99)
Potassium: 4.3 meq/L (ref 3.5–5.1)
Sodium: 136 meq/L (ref 135–145)
Total Bilirubin: 0.6 mg/dL (ref 0.2–1.2)
Total Protein: 7.4 g/dL (ref 6.0–8.3)

## 2024-01-19 LAB — VITAMIN D 25 HYDROXY (VIT D DEFICIENCY, FRACTURES): VITD: 78.94 ng/mL (ref 30.00–100.00)

## 2024-01-19 LAB — LIPID PANEL
Cholesterol: 160 mg/dL (ref 0–200)
HDL: 65.7 mg/dL (ref >39.00)
LDL Cholesterol: 80 mg/dL (ref 0–99)
NonHDL: 93.83
Total CHOL/HDL Ratio: 2
Triglycerides: 70 mg/dL (ref 0.0–149.0)
VLDL: 14 mg/dL (ref 0.0–40.0)

## 2024-01-19 NOTE — Progress Notes (Unsigned)
  Cardiology Office Note:   Date:  01/20/2024  ID:  [name], DOB 21-Dec-1940, MRN [id number] PCP: Sharlene Dory, DO  [facility] Providers Cardiologist:  Rollene Rotunda, MD {  History of Present Illness:   [name] is a 83 y.o. female for follow up of moderate AS on echo in Sept.  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      Since I last saw her she has had no new cardiovascular complaints.  She does have severe aortic stenosis that I am following.  Her gradient last year in September mean was 48.  Moderate thickening try however, she said she absolutely had no symptoms.  She gets around in does yard work.  In fact she was picking up limbs the other day and she was not having any shortness of breath, PND or orthopnea.  She does not have any palpitations, presyncope or syncope.  She has started to have some joint problems.  I walked her on a treadmill and she was able to go about 4 minutes before getting fatigued.  There were no high risk findings.  ROS:  As stated in the HPI and negative for all other systems.   Studies Reviewed:    EKG:   NA  Risk Assessment/Calculations:              Physical Exam:   VS:  BP 130/62 (BP Location: Left Arm, Patient Position: Sitting, Cuff Size: Normal)   Pulse 81   Ht 5' (1.524 m)   Wt 115 lb (52.2 kg)   SpO2 95%   BMI 22.46 kg/m    Wt Readings from Last 3 Encounters:  01/20/24 115 lb (52.2 kg)  01/19/24 115 lb 9.6 oz (52.4 kg)  07/29/23 115 lb (52.2 kg)     GEN: Well nourished, well developed in no acute distress NECK: No JVD; No carotid bruits CARDIAC: RRR, 3 out of 6 late peaking systolic murmur radiating slightly at the aortic outflow tract, no diastolic murmurs, rubs, gallops RESPIRATORY:  Clear to auscultation without rales, wheezing or rhonchi  ABDOMEN: Soft, non-tender, non-distended EXTREMITIES:  No edema; No deformity   ASSESSMENT AND PLAN:   AS: I am concerned  about her gradient having increased from 23-24 but she has no symptoms.   She really still wants to delay TAVR.   I am going to repeat an echocardiogram in September of this year.  If it is progressed at all and going to probably refer her for testing or if she develops symptoms between now and then.  We talked about the symptoms at length.   HTN:   BP is at target.  No change in therapy.  HLD:  LDL was 81 with an HDL 57.  No change in therapy.    TIA:   She continues Aggrenox which she has been on long-term.  No change in therapy.     Follow up with me in Sept.   Signed, Rollene Rotunda, MD

## 2024-01-19 NOTE — Progress Notes (Signed)
 Chief Complaint  Patient presents with   Medical Management of Chronic Issues    Patient presents today for a 6 month follow-up   Quality Metric Gaps    AWV    Subjective [name] presents for f/u anxiety.  Pt is currently being treated with Zoloft 100 mg/d.  Reports doing well since treatment. No thoughts of harming self or others. No self-medication with alcohol, prescription drugs or illicit drugs. Pt is not following with a counselor/psychologist.  Hyperlipidemia Patient presents for dyslipidemia follow up. Currently being treated with Zocor 20 mg/d and compliance with treatment thus far has been good. She  confirms myalgias in thigh. She is adhering to a healthy diet. Exercise: active in yard. No CP or SOB.  The patient is not known to have coexisting coronary artery disease.  Hypertension Patient presents for hypertension follow up. She does monitor home blood pressures. Blood pressures ranging on average from 130's/50's. She is compliant with medications- Norvasc 10 mg/d, Toprol XL 50 mg/d. Patient has these side effects of medication: none Diet/exercise as above.   L knee pain for 3-4 mo. No inj or change in activity. No bruising, redness, swelling.   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HCC)    Mitral regurgitation    Other iatrogenic hypothyroidism    Skin cancer    basal cell cancers   Unspecified essential hypertension    Unspecified transient cerebral ischemia    Allergies as of 01/19/2024       Reactions   Sulfonamide Derivatives    REACTION: rash        Medication List        Accurate as of January 19, 2024  9:42 AM. If you have any questions, ask your nurse or doctor.          STOP taking these medications    azithromycin 250 MG tablet Commonly known as:  ZITHROMAX Stopped by: Jilda Roche Deitrick Ferreri   ketoconazole 2 % shampoo Commonly known as: NIZORAL Stopped by: Jilda Roche Bernis Schreur   levocetirizine 5 MG tablet Commonly known as: XYZAL Stopped by: Sharlene Dory       TAKE these medications    alendronate 70 MG tablet Commonly known as: FOSAMAX Take 1 tablet (70 mg total) by mouth once a week. Take with a full glass of water on an empty stomach.   amLODipine 10 MG tablet Commonly known as: NORVASC TAKE 1 TABLET BY MOUTH DAILY   Calcium Carb-Cholecalciferol 600-5 MG-MCG Tabs Take 2 tablets by mouth daily.   CENTRUM SILVER PO Take 1 tablet by mouth daily.   cetirizine 5 MG tablet Commonly known as: ZYRTEC Take 5 mg by mouth as needed.   dipyridamole-aspirin 200-25 MG 12hr capsule Commonly known as: AGGRENOX Take 1 capsule by mouth 2 (two) times daily.   fluocinonide 0.05 % external solution Commonly known as: LIDEX Apply 1 Application topically every other day.   furosemide 20 MG tablet Commonly known as: LASIX TAKE 1/2 TABLET BY MOUTH DAILY   levothyroxine 100 MCG tablet Commonly known as: SYNTHROID Take 100 mcg by mouth daily.   metoprolol succinate 50 MG 24 hr tablet Commonly known as: TOPROL-XL TAKE ONE TABLET BY MOUTH DAILY WITH OR IMMEDIATELY FOLLOWING A MEAL   PROBIOTIC ACIDOPHILUS PO Take by mouth daily.  Reported on 12/03/2015   sertraline 100 MG tablet Commonly known as: ZOLOFT TAKE 1 TABLET BY MOUTH DAILY   simvastatin 20 MG tablet Commonly known as: ZOCOR Take 1 tablet (20 mg total) by mouth at bedtime.   Vitamin D 50 MCG (2000 UT) Caps Take 1 capsule daily.   vitamin E 180 MG (400 UNITS) capsule Take 400 Units by mouth daily.        Exam BP 130/60 (BP Location: Left Arm, Cuff Size: Normal)   Pulse 73   Temp 97.7 F (36.5 C)   Ht 5' (1.524 m)   Wt 115 lb 9.6 oz (52.4 kg)   SpO2 97%   BMI 22.58 kg/m  General:  well developed, well nourished, in no apparent  distress Heart: RRR, + bilateral bruits, loud systolic ejection murmur heard loudest at the aortic listening post, I do not appreciate any thrills, no lower extremity edema Lungs: CTAB.  No respiratory distress Psych: well oriented with normal range of affect and age-appropriate judgement/insight, alert and oriented x4.  Assessment and Plan  GAD (generalized anxiety disorder)  Mixed hyperlipidemia - Plan: Comprehensive metabolic panel, Lipid panel  Essential hypertension  Left knee pain, unspecified chronicity - Plan: DG Knee Complete 4 Views Left  Age-related osteoporosis without current pathological fracture - Plan: VITAMIN D 25 Hydroxy (Vit-D Deficiency, Fractures)  Chronic, stable.  Continue Zoloft 100 mg daily. Chronic, hopefully stable.  Continue Zocor 20 mg daily.  May need to take a drug holiday depending on how her x-ray turns out.  Counseled on diet and exercise. Chronic, stable.  Continue Norvasc 10 mg daily, Toprol-XL 50 mg daily. Check an x-ray.  If suggestive of arthritis, we will proceed accordingly.  If negative, will have her take a 2-week drug holiday for the Zocor. She will continue Fosamax through August 2026.  We will check a DEXA later that year. F/u in 6 mo. The patient voiced understanding and agreement to the plan.  Jilda Roche [place], DO 01/19/24 9:42 AM

## 2024-01-19 NOTE — Patient Instructions (Signed)
 Give [place] 2-3 business days to get the results of your labs back.   Keep the diet clean and stay active.  We will be in touch with your X-ray results.  Let us know if you need anything.  Stretching and range of motion exercises These exercises warm up your muscles and joints and improve the movement and flexibility of your knee. These exercises also help to relieve pain and stiffness.  Exercise A: Knee flexion, active Lie on your back with both knees straight. If this causes back discomfort, bend your uninjured knee so your foot is flat on the floor. Slowly slide your left / right heel back toward your buttocks until you feel a gentle stretch in the front of your knee or thigh. Stop if you have pain. Hold for3 seconds. Slowly slide your left / right heel back to the starting position. 10 total repetitions. Repeat 2 times. Complete this exercise 3 times a week.  Exercise B: Knee extension, sitting Sit with your left / right heel propped on a chair, a coffee table, or a footstool. Do not have anything under your knee to support it. Allow your leg muscles to relax, letting gravity straighten out your knee. You should feel a stretch behind your left / right knee. If told by your health care provide just above your kneecap. Hold this position for 3 seconds. Repeat for a total of 10 repetitions. Repeat 2 times. Complete this stretch 3 times a week.  Strengthening exercises These exercises build strength and endurance in your knee. Endurance is the ability to use your muscles for a long time, even after they get tired.  Exercise C: Quadriceps, isometric Lie on your back with your left / right leg extended and your other knee bent. Put a rolled towel or small pillow under your right/left knee if told by your health care provider. Slowly tense the muscles in the front of your left / right thigh by pushing the back of your knee down. You should see your knee cap slide up toward your hip or see  increased dimpling just above the knee. For 3 seconds, keep the muscle as tight as you can without increasing your pain. Relax the muscles slowly and completely. Repeat for 10 total repetitions. Repeat 2 times. Complete this exercise 3 times a week. Exercise D: Straight leg raises (quadriceps) Lie on your back with your left / right leg extended and your other knee bent. Tense the muscles in the front of your left / right thigh. You should see your kneecap slide up or see increased dimpling just above the knee. Keep these muscles tight as you raise your leg 4-6 inches (10-15 cm) off the floor. Hold this position for 3 seconds. Keep these muscles tense as you lower your leg. Relax the muscles slowly and completely. Repeat for a total of 10 repetitions. Repeat 2 times. Complete this exercise 3 times a week.  Exercise E: Hamstring curls On the floor or a bed, lie on your abdomen with your legs straight. Put a folded towel or small pillow under your left / right thigh, just above your kneecap. Slowly bend your left / right knee as far as you can without pain. Keep your hips flat against the floor or bed. Hold this position for 3 seconds. Slowly lower your leg to the starting position. Repeat for a total of 10 repetitions. Repeat 2 times. Complete this exercise 3 times per week.  Stretching exercises These exercises warm up your muscles and  joints and improve the movement and flexibility of your knee. These exercises also help to relieve pain and stiffness.  Exercise A: Quadriceps, prone Lie on your abdomen on a firm surface, such as a bed or padded floor. Bend your left / right knee and hold your ankle. If you cannot reach your ankle or pant leg, loop a belt around your foot and grab the belt instead. Gently pull your heel toward your buttocks. Your knee should not slide out to the side. You should feel a stretch in the front of your thigh and knee. Hold this position for 30 seconds. Repeat 2  times. Complete this stretch 3 times a week.  Exercise B: Hamstring, doorway Lie on your back in front of a doorway with your left / right leg resting against the wall and your other leg flat on the floor in the doorway. There should be a slight bend in your left / right knee. Straighten your left / right knee. You should feel a stretch behind your knee or thigh. If you do not feel that stretch, scoot your buttocks closer to the door. Hold this position for 30 seconds. Repeat 2 times. Complete this stretch 3 times a week.  Strengthening exercises These exercises build strength and endurance in your knee and leg muscles. Endurance is the ability to use your muscles for a long time, even after they get tired.   Exercise D: Wall slides (quadriceps) Lean your back against a smooth wall or door, and walk your feet out 18-24 inches (45-61 cm) from it. Place your feet hip-width apart. Slowly slide down the wall or door until your knees bend 90 degrees. Keep your knees over your heels, not over your toes. Keep your knees in line with your hips. Hold for 2 seconds. Stand up to rest for 60 seconds. Repeat 2 times. Complete this exercise 3 times a week.  Exercise E: Bridge (hip extensors) Lie on your back on a firm surface with your knees bent and your feet flat on the floor. Tighten your buttocks muscles and lift your bottom off the floor until your trunk is level with your thighs. Do not arch your back. You should feel the muscles working in your buttocks and the back of your thighs. Hold this position for 2 seconds. Slowly lower your hips to the starting position. Let your buttocks muscles relax completely between repetitions. Repeat 2 times. Complete this exercise 3 times a week.

## 2024-01-20 ENCOUNTER — Ambulatory Visit: Payer: Medicare Other | Attending: Cardiology | Admitting: Cardiology

## 2024-01-20 ENCOUNTER — Encounter: Payer: Self-pay | Admitting: Cardiology

## 2024-01-20 VITALS — BP 130/62 | HR 81 | Ht 60.0 in | Wt 115.0 lb

## 2024-01-20 DIAGNOSIS — I350 Nonrheumatic aortic (valve) stenosis: Secondary | ICD-10-CM | POA: Diagnosis not present

## 2024-01-20 DIAGNOSIS — E785 Hyperlipidemia, unspecified: Secondary | ICD-10-CM | POA: Insufficient documentation

## 2024-01-20 DIAGNOSIS — I10 Essential (primary) hypertension: Secondary | ICD-10-CM | POA: Diagnosis not present

## 2024-01-20 NOTE — Patient Instructions (Signed)
 Medication Instructions:  No changes. *If you need a refill on your cardiac medications before your next appointment, please call your pharmacy*  Testing/Procedures: Your physician has requested that you have an echocardiogram. Due in September 2025. Echocardiography is a painless test that uses sound waves to create images of your heart. It provides your doctor with information about the size and shape of your heart and how well your heart's chambers and valves are working. This procedure takes approximately one hour. There are no restrictions for this procedure. Please do NOT wear cologne, perfume, aftershave, or lotions (deodorant is allowed). Please arrive 15 minutes prior to your appointment time.  Please note: We ask at that you not bring children with you during ultrasound (echo/ vascular) testing. Due to room size and safety concerns, children are not allowed in the ultrasound rooms during exams. Our front office staff cannot provide observation of children in our lobby area while testing is being conducted. An adult accompanying a patient to their appointment will only be allowed in the ultrasound room at the discretion of the ultrasound technician under special circumstances. We apologize for any inconvenience.    Follow-Up: At [facility], you and your health needs are our priority.  As part of our continuing mission to provide you with exceptional heart care, we have created designated Provider Care Teams.  These Care Teams include your primary Cardiologist (physician) and Advanced Practice Providers (APPs -  Physician Assistants and Nurse Practitioners) who all work together to provide you with the care you need, when you need it.  We recommend signing up for the patient portal called "MyChart".  Sign up information is provided on this After Visit Summary.  MyChart is used to connect with patients for Virtual Visits (Telemedicine).  Patients are able to view lab/test results,  encounter notes, upcoming appointments, etc.  Non-urgent messages can be sent to your provider as well.   To learn more about what you can do with MyChart, go to [email or web address].    Your next appointment:   7 month(s)  Provider:   Rollene Rotunda, MD     Other Instructions

## 2024-02-08 ENCOUNTER — Ambulatory Visit (INDEPENDENT_AMBULATORY_CARE_PROVIDER_SITE_OTHER): Payer: Medicare Other

## 2024-02-08 VITALS — Ht 60.0 in | Wt 118.0 lb

## 2024-02-08 DIAGNOSIS — Z0000 Encounter for general adult medical examination without abnormal findings: Secondary | ICD-10-CM | POA: Diagnosis not present

## 2024-02-08 NOTE — Patient Instructions (Addendum)
 Ms. Dye , Thank you for taking time to come for your Medicare Wellness Visit. I appreciate your ongoing commitment to your health goals. Please review the following plan we discussed and let me know if I can assist you in the future.   Referrals/Orders/Follow-Ups/Clinician Recommendations:   This is a list of the screening recommended for you and due dates:  Health Maintenance  Topic Date Due   COVID-19 Vaccine (4 - 2024-25 season) 11/01/2024*   Flu Shot  06/02/2024   Mammogram  09/08/2024   Medicare Annual Wellness Visit  02/07/2025   DTaP/Tdap/Td vaccine (3 - Td or Tdap) 07/30/2031   Pneumonia Vaccine  Completed   DEXA scan (bone density measurement)  Completed   Zoster (Shingles) Vaccine  Completed   HPV Vaccine  Aged Out  *Topic was postponed. The date shown is not the original due date.    Advanced directives: (In Chart) A copy of your advanced directives are scanned into your chart should your provider ever need it.  Next Medicare Annual Wellness Visit scheduled for next year: Yes

## 2024-02-08 NOTE — Progress Notes (Signed)
 Subjective:   [name] is a 83 y.o. who presents for a Medicare Wellness preventive visit.  Visit Complete: Virtual I connected with  [name] on 02/08/24 by a audio enabled telemedicine application and verified that I am speaking with the correct person using two identifiers.  Patient Location: Home  Provider Location: Home Office  I discussed the limitations of evaluation and management by telemedicine. The patient expressed understanding and agreed to proceed.  Vital Signs: Because this visit was a virtual/telehealth visit, some criteria may be missing or patient reported. Any vitals not documented were not able to be obtained and vitals that have been documented are patient reported.    Persons Participating in Visit: Patient.  AWV Questionnaire: No: Patient Medicare AWV questionnaire was not completed prior to this visit.  Cardiac Risk Factors include: advanced age (>35men, >82 women);hypertension     Objective:    Today's Vitals   02/08/24 1400  Weight: 118 lb (53.5 kg)  Height: 5' (1.524 m)   Body mass index is 23.05 kg/m.     02/08/2024    2:11 PM 02/01/2023    3:03 PM 05/06/2022    6:55 AM 01/30/2022    9:42 AM 11/21/2020    3:24 PM 11/14/2019   10:07 AM  Advanced Directives  Does Patient Have a Medical Advance Directive? Yes Yes Yes Yes Yes Yes  Type of Estate agent of [place];Living will Healthcare Power of [place];Living will Healthcare Power of [place];Living will Healthcare Power of [place];Living will Healthcare Power of [place];Living will Healthcare Power of [place];Living will  Does patient want to make changes to medical advance directive? No - Patient declined No - Patient declined No - Patient declined   No - Patient declined  Copy of Healthcare Power of Attorney in Chart? Yes - validated most recent copy scanned in chart (See row information) Yes - validated most recent copy scanned in chart (See row information)   Yes - validated most recent copy scanned in chart (See row information) Yes - validated most recent copy scanned in chart (See row information) Yes - validated most recent copy scanned in chart (See row information)    Current Medications (verified) Outpatient Encounter Medications as of 02/08/2024  Medication Sig   alendronate (FOSAMAX) 70 MG tablet Take 1 tablet (70 mg total) by mouth once a week. Take with a full glass of water on an empty stomach.   amLODipine (NORVASC) 10 MG tablet TAKE 1 TABLET BY MOUTH DAILY   Calcium Carb-Cholecalciferol 600-5 MG-MCG TABS Take 2 tablets by mouth daily.   cetirizine (ZYRTEC) 5 MG tablet Take 5 mg by mouth as needed.   Cholecalciferol (VITAMIN D) 50 MCG (2000 UT) CAPS Take 1 capsule daily.   dipyridamole-aspirin (AGGRENOX) 200-25 MG 12hr capsule Take 1 capsule by mouth 2 (two) times daily.   fluocinonide (LIDEX) 0.05 % external solution Apply 1 Application topically every other day.   furosemide (LASIX) 20 MG tablet TAKE 1/2 TABLET BY MOUTH DAILY   Lactobacillus (PROBIOTIC ACIDOPHILUS PO) Take by mouth daily. Reported on 12/03/2015   levothyroxine (SYNTHROID) 100 MCG tablet Take 100 mcg by mouth daily.   metoprolol succinate (TOPROL-XL) 50 MG 24 hr tablet TAKE ONE TABLET BY MOUTH DAILY WITH OR IMMEDIATELY FOLLOWING A MEAL   Multiple Vitamins-Minerals (CENTRUM SILVER PO) Take 1 tablet by mouth daily.   sertraline (ZOLOFT) 100 MG tablet TAKE 1 TABLET BY MOUTH DAILY   simvastatin (ZOCOR) 20 MG tablet Take 1 tablet (  20 mg total) by mouth at bedtime.   vitamin E 400 UNIT capsule Take 400 Units by mouth daily.   No facility-administered encounter medications on file as of 02/08/2024.    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  11/04/2012   june 2015   COLONOSCOPY     DILATION AND CURETTAGE OF UTERUS     EYE SURGERY  08/22/2014   POLYPECTOMY     TOTAL THYROIDECTOMY     Family History  Problem Relation Age of Onset   Diabetes Mother    Congestive Heart Failure Mother    Heart disease Mother    Hypertension Mother    Kidney disease Mother    Hypertension Father    CVA Father 108   Early death Father    Stroke Father    CAD Brother        stents placed, CABG   Cancer Brother    Diabetes Brother    Hyperlipidemia Brother    Hypertension Brother    Prostate cancer Brother    Cancer Brother    Diabetes Brother    Early death Brother    Hypertension Brother    Hyperlipidemia Brother    Lung cancer Brother    Cancer Brother    Early death Brother    Hyperlipidemia Brother    Hypertension Brother    Bone cancer Brother    Colon polyps Sister        x 3    Diabetes Sister    Colon cancer Neg Hx    Esophageal cancer Neg Hx    Stomach cancer Neg Hx    Social History   Socioeconomic History   Marital status: Widowed    Spouse name: Aziya Arena   Number of children: 0   Years of education: Not on file   Highest education level: Not on file  Occupational History   Occupation: Retired    Comment: data entry from [employer]  Tobacco Use   Smoking status: Never   Smokeless tobacco: Never  Vaping Use   Vaping status: Never Used  Substance and Sexual Activity   Alcohol use: No    Alcohol/week: 0.0 standard drinks of alcohol   Drug use: No   Sexual activity: Not Currently  Other Topics Concern   Not on file  Social History Narrative   Widow since 09/2018.    Lost Brother in Jan 2019 and 2 sisters in Dec 2019.    2 sister living.    Social Drivers of Corporate investment banker Strain: Low Risk  (02/08/2024)   Overall Financial Resource Strain (CARDIA)    Difficulty of Paying Living Expenses: Not hard at all  Food Insecurity: No Food Insecurity (02/08/2024)   Hunger Vital Sign    Worried About Running Out of Food in the Last Year: Never true    Ran Out of Food in the Last Year: Never true  Transportation Needs:  No Transportation Needs (02/08/2024)   PRAPARE - Administrator, Civil Service (Medical): No    Lack of Transportation (Non-Medical): No  Physical Activity: Sufficiently Active (02/08/2024)   Exercise Vital Sign    Days of Exercise per Week: 7 days    Minutes of Exercise per Session: 30 min  Stress: No Stress Concern Present (02/08/2024)   [employer] of Occupational Health - Occupational Stress Questionnaire    Feeling of Stress : Not at all  Social Connections: Moderately Integrated (02/08/2024)   Social Connection and Isolation Panel [NHANES]    Frequency of Communication with Friends and Family: More than three times a week    Frequency of Social Gatherings with Friends and Family: More than three times a week    Attends Religious Services: More than 4 times per year    Active Member of [employer] or Organizations: Yes    Attends Banker Meetings: More than 4 times per year    Marital Status: Widowed    Tobacco Counseling Counseling given: Not Answered    Clinical Intake:  Pre-visit preparation completed: Yes  Pain : No/denies pain     BMI - recorded: 23.05 Nutritional Status: BMI of 19-24  Normal Nutritional Risks: None Diabetes: No  Lab Results  Component Value Date   HGBA1C 6.0 02/26/2023     How often do you need to have someone help you when you read instructions, pamphlets, or other written materials from your doctor or pharmacy?: 1 - Never  Interpreter Needed?: No  Information entered by :: Theresa Mulligan LPN   Activities of Daily  Living     02/08/2024    2:10 PM  In your present state of health, do you have any difficulty performing the following activities:  Hearing? 0  Vision? 0  Difficulty concentrating or making decisions? 0  Walking or climbing stairs? 0  Dressing or bathing? 0  Doing errands, shopping? 0  Preparing Food and eating ? N  Using the Toilet? N  In the past six months, have you accidently leaked urine? N  Do you have problems with loss of bowel control? N  Managing your Medications? N  Managing your Finances? N  Housekeeping or managing your Housekeeping? N    Patient Care Team: Sharlene Dory, DO as PCP - General (Family Medicine) Rollene Rotunda, MD as PCP - Cardiology (Cardiology) Talmage Coin, MD as Consulting Physician (Endocrinology) Jethro Bolus, MD as Consulting Physician (Ophthalmology) Rollene Rotunda, MD as Consulting Physician (Cardiology)  Indicate any recent Medical Services you may have received from other than [facility] providers in the past year (date may be approximate).     Assessment:   This is a routine wellness examination for Laurieanne.  Hearing/Vision screen Hearing Screening - Comments:: Denies hearing difficulties   Vision Screening - Comments:: Wears rx glasses - up to date with routine eye exams with  Dr Charlette Caffey   Goals Addressed               This Visit's Progress     Increase physical activity (pt-stated)        Remain active.       Depression Screen     02/08/2024    2:06 PM 07/21/2023    1:48 PM 02/01/2023    3:13 PM 01/13/2023    2:01 PM 01/30/2022    9:38 AM 11/21/2020    3:31 PM 11/14/2019   10:18 AM  PHQ 2/9 Scores  PHQ -  2 Score 0 0 0 0 0 0 0  PHQ- 9 Score  0  0       Fall Risk     02/08/2024    2:11 PM 07/21/2023    1:47 PM 02/01/2023    3:10 PM 01/13/2023    2:01 PM 01/30/2022    9:43 AM  Fall Risk   Falls in the past year? 0 0 1 0 0  Number falls in past yr: 0 0 0 0 0  Injury with Fall? 0 0 0 0 0  Risk for fall due to :  No Fall Risks No Fall Risks No Fall Risks  No Fall Risks  Follow up Falls prevention discussed;Falls evaluation completed Falls evaluation completed Falls evaluation completed Falls evaluation completed Education provided    MEDICARE RISK AT HOME:  Medicare Risk at Home Any stairs in or around the home?: No If so, are there any without handrails?: No Home free of loose throw rugs in walkways, pet beds, electrical cords, etc?: Yes Adequate lighting in your home to reduce risk of falls?: Yes Life alert?: No Use of a cane, walker or w/c?: No Grab bars in the bathroom?: Yes Shower chair or bench in shower?: Yes Elevated toilet seat or a handicapped toilet?: No  TIMED UP AND GO:  Was the test performed?  No  Cognitive Function: 6CIT completed        02/08/2024    2:11 PM 02/01/2023    3:24 PM 01/30/2022    9:47 AM 11/14/2019   10:20 AM  6CIT Screen  What Year? 0 points 0 points 0 points 0 points  What month? 0 points 0 points 0 points 0 points  What time? 0 points 0 points 0 points 0 points  Count back from 20 0 points 0 points 0 points 0 points  Months in reverse 0 points 0 points 0 points 0 points  Repeat phrase 0 points 0 points 0 points 2 points  Total Score 0 points 0 points 0 points 2 points    Immunizations Immunization History  Administered Date(s) Administered   Fluad Quad(high Dose 65+) 08/18/2019, 08/13/2021   Fluad Trivalent(High Dose 65+) 09/24/2023   H1N1 10/09/2008   Influenza Split 07/20/2011, 07/14/2012   Influenza Whole 07/25/2008, 08/09/2009, 07/25/2010   Influenza, High Dose Seasonal PF 08/21/2016, 08/20/2017, 07/20/2018, 08/08/2020, 08/10/2022   Influenza,inj,Quad PF,6+ Mos 07/26/2013, 07/30/2014, 07/30/2015   PFIZER(Purple Top)SARS-COV-2 Vaccination 11/21/2019, 12/12/2019, 09/04/2020   Pneumococcal Conjugate-13 05/24/2014   Pneumococcal Polysaccharide-23 11/02/2006   Td 02/27/2009   Tdap 07/29/2021   Zoster Recombinant(Shingrix) 09/01/2017, 11/01/2017,  11/12/2017   Zoster, Live 11/02/2010    Screening Tests Health Maintenance  Topic Date Due   COVID-19 Vaccine (4 - 2024-25 season) 11/01/2024 (Originally 07/04/2023)   INFLUENZA VACCINE  06/02/2024   MAMMOGRAM  09/08/2024   Medicare Annual Wellness (AWV)  02/07/2025   DTaP/Tdap/Td (3 - Td or Tdap) 07/30/2031   Pneumonia Vaccine 62+ Years old  Completed   DEXA SCAN  Completed   Zoster Vaccines- Shingrix  Completed   HPV VACCINES  Aged Out    Health Maintenance  There are no preventive care reminders to display for this patient.  Health Maintenance Items Addressed:    Additional Screening:  Vision Screening: Recommended annual ophthalmology exams for early detection of glaucoma and other disorders of the eye.  Dental Screening: Recommended annual dental exams for proper oral hygiene  Community Resource Referral / Chronic Care Management: CRR required this visit?  No  CCM required this visit?  No     Plan:     I have personally reviewed and noted the following in the patient's chart:   Medical and social history Use of alcohol, tobacco or illicit drugs  Current medications and supplements including opioid prescriptions. Patient is not currently taking opioid prescriptions. Functional ability and status Nutritional status Physical activity Advanced directives List of other physicians Hospitalizations, surgeries, and ER visits in previous 12 months Vitals Screenings to include cognitive, depression, and falls Referrals and appointments  In addition, I have reviewed and discussed with patient certain preventive protocols, quality metrics, and best practice recommendations. A written personalized care plan for preventive services as well as general preventive health recommendations were provided to patient.     Tillie Rung, LPN   12/12/5619   After Visit Summary: (MyChart) Due to this being a telephonic visit, the after visit summary with patients personalized  plan was offered to patient via MyChart   Notes: Nothing significant to report at this time.

## 2024-02-09 ENCOUNTER — Telehealth: Payer: Self-pay

## 2024-02-09 NOTE — Telephone Encounter (Signed)
 Yes, go back on it. Options are PT, could consider steroid injection in the knee, but arthritic changes on imaging were mild. Would recommend PT or watchful waiting. Ty.

## 2024-02-09 NOTE — Telephone Encounter (Signed)
 Copied from CRM [id number]. 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 She would like to speak with Dr.Wednling's nurse if possible.   Returned patient's call and she stated pain have not improved. She would like to know if she should go back into the simvastatin?

## 2024-02-09 NOTE — Telephone Encounter (Signed)
 Called patient and no answer left vm to return call

## 2024-02-10 NOTE — Telephone Encounter (Signed)
 Patient called back and requested to speak with you. I explained that you weren't in the clinic today and that I will leave a message for you. Please call and advise.

## 2024-02-11 NOTE — Telephone Encounter (Signed)
Patient was advised and verbalized understanding. 

## 2024-02-11 NOTE — Telephone Encounter (Signed)
 Copied from CRM [id number]. Topic: General - Other >> Feb 11, 2024  1:12 PM Shardie S wrote: Reason for CRM: Att: Royalti Schauf- Patient returning missed phone call and requesting a callback. She is wanting to know if she should start back the simvastatin.

## 2024-04-22 ENCOUNTER — Other Ambulatory Visit: Payer: Self-pay | Admitting: Family Medicine

## 2024-04-24 ENCOUNTER — Telehealth: Payer: Self-pay | Admitting: Family Medicine

## 2024-04-24 NOTE — Telephone Encounter (Signed)
 Copied from CRM [id number]. Topic: General - Other >> Apr 24, 2024  3:41 PM [place] E wrote: Reason for CRM: Alex from [employer] called in stating that they faxed over a form today in regards to the patient. Marolyn would like a phone call if this fax has been received. Callback number [phone].

## 2024-04-24 NOTE — Telephone Encounter (Signed)
We haven't received it yet

## 2024-05-03 DIAGNOSIS — H52203 Unspecified astigmatism, bilateral: Secondary | ICD-10-CM | POA: Diagnosis not present

## 2024-05-03 DIAGNOSIS — Z83511 Family history of glaucoma: Secondary | ICD-10-CM | POA: Diagnosis not present

## 2024-05-03 DIAGNOSIS — H18453 Nodular corneal degeneration, bilateral: Secondary | ICD-10-CM | POA: Diagnosis not present

## 2024-05-03 DIAGNOSIS — H04123 Dry eye syndrome of bilateral lacrimal glands: Secondary | ICD-10-CM | POA: Diagnosis not present

## 2024-05-03 DIAGNOSIS — H0100B Unspecified blepharitis left eye, upper and lower eyelids: Secondary | ICD-10-CM | POA: Diagnosis not present

## 2024-05-03 DIAGNOSIS — H5203 Hypermetropia, bilateral: Secondary | ICD-10-CM | POA: Diagnosis not present

## 2024-05-03 DIAGNOSIS — Z961 Presence of intraocular lens: Secondary | ICD-10-CM | POA: Diagnosis not present

## 2024-05-03 DIAGNOSIS — H43813 Vitreous degeneration, bilateral: Secondary | ICD-10-CM | POA: Diagnosis not present

## 2024-05-03 DIAGNOSIS — H0100A Unspecified blepharitis right eye, upper and lower eyelids: Secondary | ICD-10-CM | POA: Diagnosis not present

## 2024-05-03 DIAGNOSIS — H26493 Other secondary cataract, bilateral: Secondary | ICD-10-CM | POA: Diagnosis not present

## 2024-05-03 DIAGNOSIS — H524 Presbyopia: Secondary | ICD-10-CM | POA: Diagnosis not present

## 2024-05-18 DIAGNOSIS — Z0000 Encounter for general adult medical examination without abnormal findings: Secondary | ICD-10-CM | POA: Diagnosis not present

## 2024-05-18 DIAGNOSIS — D849 Immunodeficiency, unspecified: Secondary | ICD-10-CM | POA: Diagnosis not present

## 2024-05-18 DIAGNOSIS — M069 Rheumatoid arthritis, unspecified: Secondary | ICD-10-CM | POA: Diagnosis not present

## 2024-07-04 ENCOUNTER — Other Ambulatory Visit: Payer: Self-pay | Admitting: Family Medicine

## 2024-07-04 DIAGNOSIS — I10 Essential (primary) hypertension: Secondary | ICD-10-CM

## 2024-07-19 ENCOUNTER — Ambulatory Visit (HOSPITAL_COMMUNITY)
Admission: RE | Admit: 2024-07-19 | Discharge: 2024-07-19 | Disposition: A | Discharge status: Home / Self Care | Source: Ambulatory Visit | Attending: Cardiovascular Disease | Admitting: Cardiovascular Disease

## 2024-07-19 DIAGNOSIS — I350 Nonrheumatic aortic (valve) stenosis: Secondary | ICD-10-CM | POA: Diagnosis not present

## 2024-07-20 LAB — ECHOCARDIOGRAM COMPLETE
AR max vel: 0.56 cm2
AV Area VTI: 0.5 cm2
AV Area mean vel: 0.47 cm2
AV Mean grad: 49 mmHg
AV Peak grad: 66.4 mmHg
Ao pk vel: 4.07 m/s
Area-P 1/2: 3.17 cm2
MV VTI: 1.32 cm2
P 1/2 time: 500 ms
S' Lateral: 2.3 cm

## 2024-07-23 ENCOUNTER — Ambulatory Visit: Payer: Self-pay | Admitting: Cardiology

## 2024-07-24 ENCOUNTER — Other Ambulatory Visit: Payer: Self-pay | Admitting: Family Medicine

## 2024-07-24 DIAGNOSIS — I679 Cerebrovascular disease, unspecified: Secondary | ICD-10-CM

## 2024-07-25 ENCOUNTER — Other Ambulatory Visit: Payer: Self-pay | Admitting: Family Medicine

## 2024-07-25 DIAGNOSIS — I10 Essential (primary) hypertension: Secondary | ICD-10-CM

## 2024-07-25 NOTE — Telephone Encounter (Signed)
Patient is calling to follow up on Echo results. Please advise.

## 2024-07-26 ENCOUNTER — Encounter: Payer: Self-pay | Admitting: Family Medicine

## 2024-07-26 ENCOUNTER — Ambulatory Visit: Admitting: Family Medicine

## 2024-07-26 VITALS — BP 120/66 | HR 79 | Temp 98.1°F | Resp 18 | Ht 60.0 in | Wt 113.0 lb

## 2024-07-26 DIAGNOSIS — E785 Hyperlipidemia, unspecified: Secondary | ICD-10-CM | POA: Diagnosis not present

## 2024-07-26 DIAGNOSIS — F411 Generalized anxiety disorder: Secondary | ICD-10-CM

## 2024-07-26 DIAGNOSIS — I10 Essential (primary) hypertension: Secondary | ICD-10-CM

## 2024-07-26 DIAGNOSIS — Z23 Encounter for immunization: Secondary | ICD-10-CM

## 2024-07-26 DIAGNOSIS — M810 Age-related osteoporosis without current pathological fracture: Secondary | ICD-10-CM | POA: Diagnosis not present

## 2024-07-26 MED ORDER — SIMVASTATIN 20 MG PO TABS
20.0000 mg | ORAL_TABLET | Freq: Every day | ORAL | 3 refills | Status: AC
Start: 2024-07-26 — End: ?

## 2024-07-26 MED ORDER — ALENDRONATE SODIUM 70 MG PO TABS: 70.0000 mg | ORAL_TABLET | ORAL | 3 refills | Status: AC

## 2024-07-26 NOTE — Patient Instructions (Signed)
 Keep the diet clean and stay active.  Let us know if you need anything.

## 2024-07-26 NOTE — Progress Notes (Signed)
 Chief Complaint  Patient presents with   Follow-up    6 month    Subjective [name] is a 83 y.o. female who presents for hypertension follow up. She does monitor home blood pressures. Blood pressures ranging from 120-130's/60-70's on average. She is compliant with medications- Toprol  XL 50 mg/d, Norvasc  10 mg/d. Patient has these side effects of medication: none She is adhering to a healthy diet overall. Current exercise: yoga No CP or SOB.   Hyperlipidemia Patient presents for hyperlipidemia follow up. Currently being treated with Zocor  20 mg/d and compliance with treatment thus far has been good. She denies myalgias. Diet/exercise as above. The patient is not known to have coexisting coronary artery disease.  GAD Mood stable on Zoloft  100 mg/d. Compliant, no AE's. No SI or HI. Not following with a therapist right now.    Past Medical History:  Diagnosis Date   Allergic rhinitis, cause unspecified    Aortic stenosis    Benign neoplasm of colon    Hypercholesteremia    Lumbago    Malignant neoplasm of thyroid  gland (HCC)    Mitral regurgitation    Other iatrogenic hypothyroidism    Skin cancer    basal cell cancers   Unspecified essential hypertension    Unspecified transient cerebral ischemia     Exam BP 120/66 (BP Location: Left Arm, Cuff Size: Normal)   Pulse 79   Temp 98.1 F (36.7 C)   Resp 18   Ht 5' (1.524 m)   Wt 113 lb (51.3 kg)   SpO2 98%   BMI 22.07 kg/m  General:  well developed, well nourished, in no apparent distress Heart: RRR, no bruits, no LE edema Lungs: clear to auscultation, no accessory muscle use Psych: well oriented with normal range of affect and appropriate judgment/insight  Essential hypertension  Dyslipidemia - Plan: simvastatin  (ZOCOR ) 20 MG tablet  GAD (generalized anxiety disorder)  Age-related osteoporosis without current pathological fracture - Plan: alendronate  (FOSAMAX ) 70 MG tablet  Chronic, stable. Cont  Norvasc  10 mg/d, Toprol  XL 50 mg/d. Counseled on diet and exercise. Chronic, stable. Cont Zocor  20 mg/d.  Chronic, stable. Cont Zoloft  100 mg/d.  Cont Fosamax  for 1 more year. Cont Ca and Vit D.  F/u in 6 mo. The patient voiced understanding and agreement to the plan.  Mabel Mt Iago, DO 07/26/24  1:04 PM

## 2024-07-26 NOTE — Addendum Note (Signed)
 Addended by: DORLENE CHIQUITA RAMAN on: 07/26/2024 01:09 PM   Modules accepted: Orders

## 2024-08-03 DIAGNOSIS — Z85850 Personal history of malignant neoplasm of thyroid: Secondary | ICD-10-CM | POA: Diagnosis not present

## 2024-08-03 DIAGNOSIS — E890 Postprocedural hypothyroidism: Secondary | ICD-10-CM | POA: Diagnosis not present

## 2024-08-09 DIAGNOSIS — R7303 Prediabetes: Secondary | ICD-10-CM | POA: Diagnosis not present

## 2024-08-09 DIAGNOSIS — E890 Postprocedural hypothyroidism: Secondary | ICD-10-CM | POA: Diagnosis not present

## 2024-08-09 DIAGNOSIS — Z85850 Personal history of malignant neoplasm of thyroid: Secondary | ICD-10-CM | POA: Diagnosis not present

## 2024-08-13 NOTE — Progress Notes (Unsigned)
  Cardiology Office Note:   Date:  08/16/2024  ID:  [name], DOB [birth year], MRN [id number] PCP: Katherine Mabel Mt, DO  [facility] Providers Cardiologist:  Katherine Schilling, MD {  History of Present Illness:   [name] is a 83 y.o. female for follow up of moderate AS on echo in Sept.  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      Since I last saw her she has had no new cardiac problems.  The patient denies any new symptoms such as chest discomfort, neck or arm discomfort. There has been no new shortness of breath, PND or orthopnea. There have been no reported palpitations, presyncope or syncope.   She steadfastly does not report shortness of breath.  She was doing yard work yesterday she says without limitations.   ROS: As stated in the HPI and negative for all other systems.  Studies Reviewed:    EKG:   EKG Interpretation Date/Time:  Wednesday August 16 2024 12:34:19 EDT Ventricular Rate:  65 PR Interval:  196 QRS Duration:  80 QT Interval:  412 QTC Calculation: 428 R Axis:   76  Text Interpretation: Normal sinus rhythm Normal ECG When compared with ECG of 29-Jul-2023 14:16, No significant change was found Confirmed by Bush Katherine ([id number]) on 08/16/2024 12:38:08 PM    Risk Assessment/Calculations:        Physical Exam:   VS:  BP (!) 140/70   Pulse 66   Ht 5' (1.524 m)   Wt 115 lb (52.2 kg)   SpO2 97%   BMI 22.46 kg/m    Wt Readings from Last 3 Encounters:  08/16/24 115 lb (52.2 kg)  07/26/24 113 lb (51.3 kg)  02/08/24 118 lb (53.5 kg)     GEN: Well nourished, well developed in no acute distress NECK: No JVD; No carotid bruits CARDIAC: RRR, 3 out of 6 apical systolic murmur mid-to-late peaking, no diastolic murmurs, rubs, gallops RESPIRATORY:  Clear to auscultation without rales, wheezing or rhonchi  ABDOMEN: Soft, non-tender, non-distended EXTREMITIES:  No edema; No deformity   ASSESSMENT AND PLAN:    AS: I will repeat an echocardiogram in March.  She still has no shortness of breath and has a high functional level.  We talked about symptoms that could develop with worsening AS.  I will see her after the echo.   HTN:   BP is mildly elevated but this is unusual.  No change in therapy.   HLD:  LDL was 80 with an HDL of 65.  No change in therapy.   TIA:   She has been managed with Aggrenox  long-term.  No change in therapy.      Follow up with me after the echo in March.  Signed, Katherine Schilling, MD

## 2024-08-16 ENCOUNTER — Encounter: Payer: Self-pay | Admitting: Cardiology

## 2024-08-16 ENCOUNTER — Ambulatory Visit: Attending: Cardiovascular Disease | Admitting: Cardiology

## 2024-08-16 VITALS — BP 140/70 | HR 66 | Ht 60.0 in | Wt 115.0 lb

## 2024-08-16 DIAGNOSIS — I10 Essential (primary) hypertension: Secondary | ICD-10-CM | POA: Diagnosis not present

## 2024-08-16 DIAGNOSIS — I350 Nonrheumatic aortic (valve) stenosis: Secondary | ICD-10-CM | POA: Diagnosis not present

## 2024-08-16 DIAGNOSIS — E785 Hyperlipidemia, unspecified: Secondary | ICD-10-CM | POA: Insufficient documentation

## 2024-08-16 NOTE — Patient Instructions (Signed)
 Medication Instructions:  Your physician recommends that you continue on your current medications as directed. Please refer to the Current Medication list given to you today.  *If you need a refill on your cardiac medications before your next appointment, please call your pharmacy*  Lab Work: NONE If you have labs (blood work) drawn today and your tests are completely normal, you will receive your results only by: MyChart Message (if you have MyChart) OR A paper copy in the mail If you have any lab test that is abnormal or we need to change your treatment, we will call you to review the results.  Testing/Procedures: Echocardiogram March 2026 Your physician has requested that you have an echocardiogram. Echocardiography is a painless test that uses sound waves to create images of your heart. It provides your doctor with information about the size and shape of your heart and how well your heart's chambers and valves are working. This procedure takes approximately one hour. There are no restrictions for this procedure. Please do NOT wear cologne, perfume, aftershave, or lotions (deodorant is allowed). Please arrive 15 minutes prior to your appointment time.  Please note: We ask at that you not bring children with you during ultrasound (echo/ vascular) testing. Due to room size and safety concerns, children are not allowed in the ultrasound rooms during exams. Our front office staff cannot provide observation of children in our lobby area while testing is being conducted. An adult accompanying a patient to their appointment will only be allowed in the ultrasound room at the discretion of the ultrasound technician under special circumstances. We apologize for any inconvenience.   Follow-Up: At [facility], you and your health needs are our priority.  As part of our continuing mission to provide you with exceptional heart care, our providers are all part of one team.  This team includes  your primary Cardiologist (physician) and Advanced Practice Providers or APPs (Physician Assistants and Nurse Practitioners) who all work together to provide you with the care you need, when you need it.  Your next appointment:   March 2026 after Echo  Provider:   [place], MD  We recommend signing up for the patient portal called MyChart.  Sign up information is provided on this After Visit Summary.  MyChart is used to connect with patients for Virtual Visits (Telemedicine).  Patients are able to view lab/test results, encounter notes, upcoming appointments, etc.  Non-urgent messages can be sent to your provider as well.   To learn more about what you can do with MyChart, go to [email or web address].

## 2024-09-12 DIAGNOSIS — Z1231 Encounter for screening mammogram for malignant neoplasm of breast: Secondary | ICD-10-CM | POA: Diagnosis not present

## 2024-09-12 LAB — HM MAMMOGRAPHY

## 2024-09-13 ENCOUNTER — Encounter: Payer: Self-pay | Admitting: Family Medicine

## 2024-10-01 ENCOUNTER — Other Ambulatory Visit: Payer: Self-pay | Admitting: Family Medicine

## 2024-10-01 DIAGNOSIS — F419 Anxiety disorder, unspecified: Secondary | ICD-10-CM

## 2024-10-04 DIAGNOSIS — Z85828 Personal history of other malignant neoplasm of skin: Secondary | ICD-10-CM | POA: Diagnosis not present

## 2024-10-04 DIAGNOSIS — R7303 Prediabetes: Secondary | ICD-10-CM | POA: Diagnosis not present

## 2024-10-04 DIAGNOSIS — L408 Other psoriasis: Secondary | ICD-10-CM | POA: Diagnosis not present

## 2024-10-04 DIAGNOSIS — D1801 Hemangioma of skin and subcutaneous tissue: Secondary | ICD-10-CM | POA: Diagnosis not present

## 2024-10-04 DIAGNOSIS — Z08 Encounter for follow-up examination after completed treatment for malignant neoplasm: Secondary | ICD-10-CM | POA: Diagnosis not present

## 2024-10-04 DIAGNOSIS — L814 Other melanin hyperpigmentation: Secondary | ICD-10-CM | POA: Diagnosis not present

## 2024-10-04 DIAGNOSIS — L821 Other seborrheic keratosis: Secondary | ICD-10-CM | POA: Diagnosis not present

## 2024-10-04 DIAGNOSIS — E890 Postprocedural hypothyroidism: Secondary | ICD-10-CM | POA: Diagnosis not present

## 2024-10-22 ENCOUNTER — Other Ambulatory Visit: Payer: Self-pay | Admitting: Family Medicine

## 2024-10-22 DIAGNOSIS — I10 Essential (primary) hypertension: Secondary | ICD-10-CM

## 2025-01-08 ENCOUNTER — Ambulatory Visit (HOSPITAL_COMMUNITY)

## 2025-01-18 ENCOUNTER — Ambulatory Visit: Admitting: Cardiology

## 2025-01-23 ENCOUNTER — Ambulatory Visit: Admitting: Family Medicine

## 2025-02-13 ENCOUNTER — Ambulatory Visit
